# Patient Record
Sex: Male | Born: 1950
Health system: Southern US, Community
[De-identification: ages and names within clinical notes are randomized; demographics above are authoritative.]

## PROBLEM LIST (undated history)

## (undated) DIAGNOSIS — Z98811 Dental restoration status: Secondary | ICD-10-CM

## (undated) DIAGNOSIS — Z8489 Family history of other specified conditions: Secondary | ICD-10-CM

## (undated) DIAGNOSIS — E785 Hyperlipidemia, unspecified: Secondary | ICD-10-CM

## (undated) DIAGNOSIS — F419 Anxiety disorder, unspecified: Secondary | ICD-10-CM

## (undated) DIAGNOSIS — I714 Abdominal aortic aneurysm, without rupture, unspecified: Secondary | ICD-10-CM

## (undated) DIAGNOSIS — Z973 Presence of spectacles and contact lenses: Secondary | ICD-10-CM

## (undated) DIAGNOSIS — I10 Essential (primary) hypertension: Secondary | ICD-10-CM

## (undated) DIAGNOSIS — I2510 Atherosclerotic heart disease of native coronary artery without angina pectoris: Secondary | ICD-10-CM

## (undated) DIAGNOSIS — I219 Acute myocardial infarction, unspecified: Secondary | ICD-10-CM

## (undated) DIAGNOSIS — I480 Paroxysmal atrial fibrillation: Secondary | ICD-10-CM

## (undated) DIAGNOSIS — E7800 Pure hypercholesterolemia, unspecified: Secondary | ICD-10-CM

## (undated) DIAGNOSIS — S60459A Superficial foreign body of unspecified finger, initial encounter: Secondary | ICD-10-CM

## (undated) DIAGNOSIS — K219 Gastro-esophageal reflux disease without esophagitis: Secondary | ICD-10-CM

## (undated) DIAGNOSIS — C801 Malignant (primary) neoplasm, unspecified: Secondary | ICD-10-CM

## (undated) DIAGNOSIS — Z972 Presence of dental prosthetic device (complete) (partial): Secondary | ICD-10-CM

## (undated) HISTORY — DX: Malignant (primary) neoplasm, unspecified: C80.1

## (undated) HISTORY — PX: COLONOSCOPY: SHX174

## (undated) HISTORY — DX: Abdominal aortic aneurysm, without rupture: I71.4

## (undated) HISTORY — DX: Anxiety disorder, unspecified: F41.9

## (undated) HISTORY — DX: Pure hypercholesterolemia, unspecified: E78.00

## (undated) HISTORY — DX: Hyperlipidemia, unspecified: E78.5

## (undated) HISTORY — DX: Abdominal aortic aneurysm, without rupture, unspecified: I71.40

## (undated) HISTORY — DX: Essential (primary) hypertension: I10

## (undated) HISTORY — DX: Acute myocardial infarction, unspecified: I21.9

---

## 1995-08-22 DIAGNOSIS — I219 Acute myocardial infarction, unspecified: Secondary | ICD-10-CM

## 1995-08-22 HISTORY — DX: Acute myocardial infarction, unspecified: I21.9

## 1997-12-28 ENCOUNTER — Ambulatory Visit (HOSPITAL_COMMUNITY): Admission: RE | Admit: 1997-12-28 | Discharge: 1997-12-29 | Payer: Self-pay | Admitting: Cardiology

## 2003-03-24 ENCOUNTER — Ambulatory Visit (HOSPITAL_COMMUNITY): Admission: RE | Admit: 2003-03-24 | Discharge: 2003-03-24 | Payer: Self-pay | Admitting: Internal Medicine

## 2003-03-24 ENCOUNTER — Encounter: Payer: Self-pay | Admitting: Internal Medicine

## 2005-05-29 ENCOUNTER — Ambulatory Visit (HOSPITAL_COMMUNITY): Admission: RE | Admit: 2005-05-29 | Discharge: 2005-05-29 | Payer: Self-pay | Admitting: Internal Medicine

## 2005-05-29 ENCOUNTER — Ambulatory Visit: Payer: Self-pay | Admitting: Internal Medicine

## 2005-10-03 ENCOUNTER — Inpatient Hospital Stay (HOSPITAL_BASED_OUTPATIENT_CLINIC_OR_DEPARTMENT_OTHER): Admission: RE | Admit: 2005-10-03 | Discharge: 2005-10-03 | Payer: Self-pay | Admitting: Cardiology

## 2005-10-03 HISTORY — PX: CARDIAC CATHETERIZATION: SHX172

## 2005-10-16 ENCOUNTER — Inpatient Hospital Stay (HOSPITAL_COMMUNITY): Admission: RE | Admit: 2005-10-16 | Discharge: 2005-10-20 | Payer: Self-pay | Admitting: Cardiothoracic Surgery

## 2005-10-16 HISTORY — PX: CORONARY ARTERY BYPASS GRAFT: SHX141

## 2010-06-15 ENCOUNTER — Ambulatory Visit: Payer: Self-pay | Admitting: Internal Medicine

## 2010-06-17 DIAGNOSIS — R197 Diarrhea, unspecified: Secondary | ICD-10-CM

## 2010-06-17 HISTORY — DX: Diarrhea, unspecified: R19.7

## 2010-06-21 ENCOUNTER — Ambulatory Visit: Payer: Self-pay | Admitting: Cardiology

## 2010-09-22 NOTE — Assessment & Plan Note (Signed)
Summary: DIARRHEA-STILL GOING 6-7 X'SLAW   Visit Type:  Initial Consult Referring Provider:  Ouida Sills Primary Care Provider:  Ouida Sills  Chief Complaint:  diarrhea.  History of Present Illness: Pleasant 60 year old gentleman sent over out of the ccourtesy of Dr. Ouida Sills to further evaluate nearly 3 month history of diarrhea. Symptoms started insidiously going from from 4 bowel movements a day upwards of 10-12 nonbloody painless stools daily. No nocturnal diarrhea. Imodium did not help initially. He found that Kaopectate helped  better than anything else. Stool studies negative except for a positive lactoferrin. He states over the past couple weeks diarrhea has improved steadily now having approximately 4-6 bowel movements daily;  no abdominal pain. He continues to go about his regular routine, continues to work. No history of sick contacts, antibiotic therapy or unusual travel. No family history inflammatory bowel disease or colorectal cancer. He had his first ever screening colonoscopy 5 years ago ; negative except for some left-sided diverticula. He was slated to have another colonoscopy in 2016. He states he lost about 15 pounds during this time but hasw gainined the weight back. He has reflux symptoms well-controlled on Protonix 40 mg daily. In fact, he denies any upper GI tract symptoms such as reflux , dysphagia, early satiety, nausea or vomiting.  He states he always has had a nervous stomach. Gets diarrhea not infrequently with travel and other stressful scenarios.    Current Medications (verified): 1)  Toprol Xl 100 Mg Xr24h-Tab (Metoprolol Succinate) .... Take 1 Tablet By Mouth Once A Day 2)  Valturnal 150/160 .... Take 1 Tablet By Mouth Once A Day 3)  Lipitor 40 Mg Tabs (Atorvastatin Calcium) .... Take 1 Tablet By Mouth Once A Day 4)  Zetia 10 Mg Tabs (Ezetimibe) .... Take 1 Tablet By Mouth Once A Day 5)  Protonix 40 Mg Tbec (Pantoprazole Sodium) .... Take 1 Tablet By Mouth Once A Day 6)   Alprazolam 2 Mg Tabs (Alprazolam) .... One Tablet Daily 7)  Fish Oil .... Once Daily 8)  Glucosamine .... Take 1 Tablet By Mouth Once A Day 9)  Aspirin 325 Mg Tabs (Aspirin) .... Take 1 Tablet By Mouth Once A Day  Allergies (verified): 1)  ! Codeine 2)  ! * Niaspan  Past History:  Family History: Last updated: 06/15/2010 Father: Living age 22  Healthy Mother: Living age 17   Healthy Siblings: One sister    Open heart surgery  Social History: Last updated: 06/15/2010 Marital Status: Married Children: One son Occupation: Musician  Past Medical History: Hypertension  Hyperlipidemia Anxiety Reflux Heart Attack age 5  Past Surgical History: Open heart Surgery   2007  Family History: Father: Living age 95  Healthy Mother: Living age 29   Healthy Siblings: One sister    Open heart surgery  Social History: Marital Status: Married Children: One son Occupation: Musician  Vital Signs:  Patient profile:   60 year old male Height:      70 inches Weight:      187 pounds BMI:     26.93 Temp:     98.0 degrees F oral Pulse rate:   60 / minute BP sitting:   142 / 82  (left arm) Cuff size:   large  Vitals Entered By: Cloria Spring LPN (June 15, 2010 11:09 AM)  Physical Exam  General:  alert conversant well-appearing well-groomed individual resting comfortably Lungs:  clear to auscultation Heart:  regular rate rhythm without murmur gallop or Abdomen:  flat positive bowel  sounds soft, nontender without appreciable mass or organomegaly Rectal:  no mass the rectal vault. No stool in rectal vault. Mucous is Hemoccult-negative  Impression & Recommendations: Impression: 60 year old gentleman with a protracted bouts of nonbloody watery diarrhea over the past 3 months. Over the past few weeks, diarrhea has improved considerably. It appears he has had a protracted, acute illness superimposed on intermittent diarrhea predominant irritable bowel  syndrome.  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 Stool studies as noted above.  He certainly is clinically much improved.  Its good to know  he had a negative colonoscopy some 5 years ago.  Recommendations: Brief course of Bentyl 10 mg a.c. and h.s. with emphasis on taking the 10 mg tablets each morning for the next several days.  We'll also add a probiotic in the way of Restora to his regimen - one capsule daily  - samples provided.  He can begin to wean off Kaopectate.  Would like to check one immunofecal occult blood test on his stool in 3-4 weeks. If his clinical course is not one of steady improvemen,t he is to call in 2 weeks and let us know. At this point, I would not change the timing of his next colonoscopy which is currently slated for 2016.  I thank Dr. Ouida Sills for allowing me to see this very nice gentleman today.  Other Orders: Consultation Level III [phone])

## 2011-01-06 NOTE — Cardiovascular Report (Signed)
NAMECECILE, GUEVARA                  ACCOUNT NO.:  [id number]   MEDICAL RECORD NO.:  [id number]          PATIENT TYPE:  OIB   LOCATION:  1961                         FACILITY:  [facility]   PHYSICIAN:  Colleen Can. Deborah Chalk, M.D.DATE OF BIRTH:  [birth year]   DATE OF PROCEDURE:  10/03/2005  DATE OF DISCHARGE:                              CARDIAC CATHETERIZATION   HISTORY:  Mr. Couey is a 60 year old male with a history of recurrent angina  over the last year that has been worse for the last 4-5 months. He has known  ischemic heart disease with remote myocardial infarction. He has had a  totally occluded right coronary artery. He has ongoing cigarette abuse,  hyperlipidemia, and hypertension.   PROCEDURE:  Left heart catheterization with selective coronary angiography,  left ventricular angiography.   TYPE AND SITE OF ENTRY:  Percutaneous, right femoral artery.   CATHETERS:  A 4-French __________ Judkins right and left coronary catheters,  4-French pigtail ventriculographic catheter.   CONTRAST MATERIAL:  Omnipaque.   MEDICATIONS GIVEN PRIOR TO THE PROCEDURE:  Valium 10 mg p.o.   MEDICATIONS GIVEN DURING THE PROCEDURE:  Versed 5 milligrams IV.   COMMENTS:  The patient tolerated procedure well.   HEMODYNAMIC DATA:  Aortic pressure of 160/92, LV was 150/13-23. There is no  aortic valve gradient noted on pullback.   ANGIOGRAPHIC DATA:  1.  Left main coronary artery is normal.  2.  Left anterior descending is the largest vessel. There is a 70%+ ostial      stenosis of the left anterior descending. This vessel has a large septal      perforating branch that provides most of the flow to the septum as well      as wrapping around the apex.  3.  Intermediate. There is small to moderate-size intermediate that has      irregularities, otherwise normal.  4.  Left circumflex. Left circumflex is small to moderate in size. It does      give collaterals to the distal right coronary artery. It has  minor      irregularities but otherwise was without significant focal disease.  5.  Right coronary artery. The right coronary artery is totally occluded. It      has a high right ventricular branch that supplies the right ventricle.      After that, there is segmental 90-95% stenosis and then a short segment      of relatively normal vessel before a total occlusion. The chronic total      occlusion is relatively long in length. There are tortuous collaterals      to the distal vessel by way of a small RV branch. There is excellent      left-to-right collaterals to the distal right coronary artery.   Left ventricular angiogram was performed in the RAO position. Overall  cardiac size and silhouette are normal. Global left ventricular function  shows an ejection fraction 55-60%. Inferior wall motion is normal. There is  no mitral regurgitation, intracardiac calcification, or intracavitary  filling defect.   OVERALL IMPRESSION:  1.  Normal left ventricular function.  2.  Severe two-vessel disease, with totally occluded right coronary artery      with left-to-right collaterals and with a 70+% stenosis in the ostium of      the left anterior descending.   DISCUSSION:  With the ongoing risk factors and an active lifestyle and  ongoing angina, it is felt that Mr. Teagle probably is best suited with  coronary artery bypass grafting.      Colleen Can. Deborah Chalk, M.D.  Electronically Signed     SNT/MEDQ  D:  10/03/2005  T:  10/03/2005  Job:  [id number]   cc:   Kingsley Callander. Ouida Sills, MD  Fax: [phone]

## 2011-01-06 NOTE — Op Note (Signed)
NAMEHARROL, Tom [name]                  ACCOUNT NO.:  [id number]   MEDICAL RECORD NO.:  [id number]          PATIENT TYPE:  AMB   LOCATION:  DAY                           FACILITY:  [facility]   PHYSICIAN:  Lionel December, M.D.    DATE OF BIRTH:  May 17, 1951   DATE OF PROCEDURE:  05/29/2005  DATE OF DISCHARGE:                                 OPERATIVE REPORT   PROCEDURE:  Colonoscopy.   INDICATION:  Tom [name] is a 60 year old Caucasian male who is here for screening  colonoscopy.  Family history is negative for colon carcinoma in first degree  relative, but he had a maternal aunt who developed colon carcinoma in the  11s.   Procedure risks were reviewed with the patient, informed consent was  obtained.   PREOP MEDICATIONS:  Demerol 75 mg IV, Versed 12 mg IV.   FINDINGS:  Procedure performed in endoscopy suite.  Patient's vital signs  and O2 sat were monitored during procedure and remained stable.  Patient was  placed in left lateral position, rectal examination performed.  No  abnormality noted on external or digital exam.  Olympus videoscope was  placed in rectum and advanced under vision into sigmoid colon and beyond.  Preparation was satisfactory.  He had thick liquid stool in the proximal  colon which had to be washed and suctioned out.  Scope was passed to the  cecum which was identified by ileocecal valve and appendiceal orifice.  Short segment of TI was also examined and was normal.  As the scope was  withdrawn colonic mucosa was once again carefully examined and there were no  polyps or tumor masses noted.  A few diverticula were noted at sigmoid  colon.  Rectal mucosa was normal.  The scope was retroflexed to examine  anorectal junction, which was unremarkable.  Endoscope was straightened and  withdrawn.  Patient tolerated the procedure well.   FINAL DIAGNOSIS:  A few small diverticula at sigmoid colon, otherwise normal  examination.   RECOMMENDATIONS:  1.  He will resume his ASA as  before.  2.  High fiber diet.  3.  Yearly hemoccults.   You may consider next screening exam in 10 years from now.      Lionel December, M.D.  Electronically Signed     NR/MEDQ  D:  05/29/2005  T:  05/29/2005  Job:  [id number]   cc:   Kingsley Callander. Ouida Sills, MD  Fax: [phone]

## 2011-01-06 NOTE — Discharge Summary (Signed)
[name], [name]                  ACCOUNT NO.:  [id number]   MEDICAL RECORD NO.:  [id number]          PATIENT TYPE:  INP   LOCATION:  2014                         FACILITY:  [facility]   PHYSICIAN:  Sheliah Plane, MD    DATE OF BIRTH:  1951-07-03   DATE OF ADMISSION:  10/16/2005  DATE OF DISCHARGE:                                 DISCHARGE SUMMARY   DISCHARGE DATE:  Tentatively October 20, 2005, to October 21, 2005.   ADMISSION DIAGNOSIS:  Coronary occlusive disease.   DISCHARGE DIAGNOSES:  1.  Coronary occlusive disease, status post coronary artery bypass grafting      x2.  2.  History of premature cardiac disease, a myocardial infarction at the age      of 12.  3.  Hypertension.  4.  Hyperlipidemia.  5.  Ongoing tobacco abuse over the past 40 years of one to two packs per      day.   SERVICE:  CVTS, Sheliah Plane, M.D.   CARDIOLOGIST:  Colleen Can. Deborah Chalk, M.D.   CONSULTATIONS:  None.   PROCEDURE:  On October 16, 2005, the patient underwent coronary artery  bypass grafting x2 with left internal mammary to the left anterior  descending coronary artery, reversed saphenous vein graft to the distal  right coronary artery, with right thigh Endovein harvesting, by Dr. Sheliah Plane.   HISTORY OF PRESENT ILLNESS:  The patient is a 60 year old male who has a  history of premature cardiac disease, having an MI at the age of 77.  At  that time the patient had total occlusion of his right coronary artery  treated medically until the last year.  The patient does note that over the  past several months, especially during hunting season when he was doing  heavy exertion, he began having chest discomfort and increasing shortness of  breath with less exertion.  The patient notes that these spells have become  worse over the past several months, but he denies any pain at rest.  The  patient notes there six to eight months' increasing fatigue doing the usual  activities at work.  The  patient does still continue to smoke on a daily  basis.  Because of the patient's symptoms, he sought medical attention from  Dr. Deborah Chalk.  The patient was started on Norvasc due to his increasing  hypertension.  The patient's previous cardiac catheterization was one 10  years ago and another approximately eight years ago.  On October 03, 2005,  Dr. Deborah Chalk performed a cardiac catheterization.  This showed the patient's  left main is normal, left anterior descending is a large vessel with 70-80%  ostial stenosis.  There is a small to moderate-sized intermedius with  luminal irregularities.  The circumflex proper is also a small vessel that  gets collateral filling in the distal right coronary artery but without  significant disease.  The right coronary artery is totally occluded.  There  is collateral flow to the distal right coronary artery.  Overall ejection  fraction is 50%.  No mitral regurgitation is noted.  The patient's  EKG was  normal, chest x-ray was within normal limits.  It was thus felt that the  patient should undergo bypass surgery to the LAD and the totally occluded  right coronary artery, and he has agreed to the above.   HOSPITAL COURSE:  The patient's hospital course remains uneventful.  The  patient was extubated on October 17, 2005.  His vital signs were stable and  he remained in normal sinus rhythm at a rate of the 80s.  The patient's labs  were within normal limits.  He did have an EKG, which showed an old inferior  MI.  The patient remained awake and alert.  He was transferred to 2000 on  October 17, 2005.  The patient was having some problems with pain control.  The patient states he takes 3 mg of Xanax a day and sometimes up to 6 mg.  The patient's Xanax was increased.  Toradol was added to the patient's  regimen of pain medications for some more relief.   The patient remained volume-overloaded.  His weight on October 18, 2005,  was 197 and preoperatively is  185.  The patient is being diuresed daily with  improvement.  The patient is having some hypertension issues  postoperatively.  His Toprol was increased to 50 mg p.o. daily and his daily  Avapro dose of 150 mg was added.  The patient's hypertension did improve  with this addition.  Tobacco cessation was consulted on October 18, 2005,  and discussed with the patient smoking cessation, and the patient says he  already quit.   The patient is being weaned on oxygen.  He takes about 2-6 L of oxygen  depending.  The patient's saturations drop when he walks.  On October 18, 2005, the patient ambulated on 6 L of oxygen with a steady gait, the patient  did not appear as short of breath this time, next walk the patient's oxygen  will be weaned a bit.  On October 19, 2005, the patient's oxygen saturation was  89% walking on room air.  He is at 92% on 4 L.  The patient will be  discharged  in one or two days depending on his progression with the oxygen  weaning.  Otherwise, the patient's gait is stable.   PHYSICAL EXAMINATION:  VITAL SIGNS:  Temperature 97.7, pulse 95, BP 148/84,  weight 197, preop 185.  Oxygen 92% on 4 L.  CARDIAC:  Regular rate and rhythm.  Sternal incision is clear, dry and  intact.  RESPIRATIONS:  Decreased breath sounds bilaterally.  No wheezing or crackles  noted.  ABDOMEN:  Benign.  EXTREMITIES:  Minimal edema.  Leg incisions appear dry and intact.   White blood cell count of 14.0, hemoglobin 11.5, hematocrit 33.5, and  platelets 157.  A BMP showed a sodium of 130, potassium of 4.1, chloride 95,  CO2 29, BUN 6, creatinine 0.9, and glucose of 136.   DISCHARGE CONDITION:  Stable.   DISPOSITION:  The patient will be discharged to home.   MEDICATIONS:  1.  Aspirin 325 mg p.o. daily.  2.  Toprol XL 50 mg p.o. daily.  3.  Lipitor 40 mg p.o. daily.  4.  Aciphex 20 mg p.o. daily.  5.  Zetia 10 mg p.o. daily. 6.  Lasix 20 mg p.o. daily x7 days.  7.  Avapro 150 mg p.o.  daily.  8.  Xanax 1 mg p.o. daily p.r.n.  9.  Potassium chloride 10 mEq daily x7 days.  10. Glucosamine.  11. Oxycodone one to two tablets every four hours p.r.n. pain.   INSTRUCTIONS:  The patient was instructed to do no driving or heavy lifting  greater than 10 pounds for three weeks.  The patient is to ambulate three to  four times daily and increase activity as tolerated.  The patient is to  continue his breathing exercises.  The patient was educated on following a  low-fat, low-salt diet.  The patient should participate in smoking  cessation.  The patient may shower and clean his wounds with mild soap and  water.  He is to call the office if any wound problems shall arise, such as  erythema, drainage or bleeding from the wound site or temperature greater  than 101.5.   FOLLOW-UP:  The patient has an appointment with Dr. Tyrone Sage on November 09, 2005, at 11 a.m.  The patient is to call Dr. Roger Shelter for an  appointment in two weeks, where he will have a chest x-ray taken.      Constance Holster, [place]      Sheliah Plane, MD  Electronically Signed    JMW/MEDQ  D:  10/19/2005  T:  10/19/2005  Job:  [id number]   cc:   Colleen Can. Deborah Chalk, M.D.  Fax: [phone]

## 2011-01-06 NOTE — Op Note (Signed)
Tom [name], Tom [name]                  ACCOUNT NO.:  [id number]   MEDICAL RECORD NO.:  [id number]          PATIENT TYPE:  INP   LOCATION:  2310                         FACILITY:  [facility]   PHYSICIAN:  Sheliah Plane, MD    DATE OF BIRTH:  [birth year]   DATE OF PROCEDURE:  DATE OF DISCHARGE:                                 OPERATIVE REPORT   PREOPERATIVE DIAGNOSIS:  Coronary occlusive disease.   POSTOPERATIVE DIAGNOSIS:  Coronary occlusive disease.   SURGICAL PROCEDURE:  Coronary artery bypass graft x2 with left internal  mammary to the left anterior descending coronary artery, reverse saphenous  vein graft to the distal right coronary artery with right thigh endovein  harvesting.   SURGEON:  Sheliah Plane, M.D.   FIRST ASSISTANT:  Constance Holster, PA   BRIEF HISTORY:  The patient is a 60 year old male who approximately 15 years  ago had a myocardial infarction.  He has done well until recently when he  began having increasing fatigue and shortness of breath with exertion.  Cardiac catheterization was performed which showed total occlusion of the  right coronary artery which is old but progression of an ostial LAD lesion  which was a focal stenosis but right at the take-off of the LAD.  Because of  the anatomic location of the stenosis, coronary artery bypass grafting was  recommended to the patient who agreed and signed informed consent.   DESCRIPTION OF PROCEDURE:  With Swan-Ganz and arterial line monitors in  place, the patient underwent general endotracheal anesthesia without  incident.  The skin of the chest and legs was prepped with Betadine and  draped in the usual sterile manner.  Using the [employer] endovein harvesting  system, the vein was harvested from the right thigh and was of good quality  and caliber.  A median sternotomy was performed.  The left internal mammary  artery was dissected down to its pedicle graft.  The distal artery was  divided, had good free flow.  The  the vessel was hydrostatically dilated  with heparinized saline.  The pericardium was opened.  The overall  ventricular function appeared preserved.  Consideration for off-pump bypass  was entertained; however, because of the intramyocardial positioning of the  LAD, the small  distal right coronary, it was decided that placing the  patient on bypass would offer better control over the distal anatomic  situation.  The patient was systemically heparinized.  The ascending aorta  on the right atrium cannulated and the aortic root vent cardioplegia needle  was introduced into the ascending aorta.  The patient was placed on  cardiopulmonary bypass, 2.4 L/minute/meter squared.  The sites of the  anastomosis were selected and dissected out of the epicardium.   The more distal PD branches were extremely small.  The right coronary artery  was identified and dissected out of the epicardial fat to the take-off of  the posterior descending.  This vessel was diffusely diseased, approximately  1.2 to 1.3 mm in size.  The LAD was intramyocardial except the very distal  third of the vessel and was  approximately 1.5 mm in size.  The patient's  body temperature was allowed to drift down.  The aortic cross clamp was  applied and 500 mL of cold blood cardioplegia was administered antegrade  with rapid diastolic arrest and heart myocardial septal temperatures  monitored after cross clamp.   Attention was turned first to the distal right coronary artery and the take-  off of the PD.  The vessel was opened and admitted a 1 mm probe.  Using  running 7-0 Prolene, distal anastomosis was performed.  After completion of  the anastomosis, additional cold blood cardioplegia was administered down  the vein graft.   Attention was then turned to the left anterior descending coronary artery  which was opened and admitted a 1.5 mm probe proximal and distal.  Using a  running 8-0 Prolene, left internal mammary artery was   anastomosed  to the  left anterior descending coronary artery.  The mammary artery was tacked to  the epicardium.  With the cross clamp still in place, the aortic vent  cardioplegia needle was removed and a single punch aortotomy was performed.  The vein graft to the posterior descending coronary artery was anastomosed  to the ascending aorta.  Air was evacuated from the aorta.  The bulldog on  the mammary artery was removed with rapid rise in myocardial septal  temperature.  Aortic cross clamp was removed.  Total cross clamp time of 54  minutes.  The patient spontaneously converted to rhythm, initially heart  block that required transient AV pacing but ultimately returned to a normal  sinus rhythm.  The sites of the anastomosis were free of bleeding.  The  patient was then ventilated and weaned from cardiopulmonary bypass without  difficulty, remained hemodynamically stable.  He was decannulated in the  usual fashion.  Protamine sulfate was administered with operative field  hemostatic.  Two atrial, two ventricular pacing wires were applied.  Graft  marker was applied, left pleural tube and Blake mediastinal drain were left  in place.  Pericardium was loosely reapproximated.  The sternum was closed  with #6 stainless steel wire.  The fascia closed with interrupted 0 Vicryl,  running 3-0 Vicryl in subcutaneous tissue, 4-0 subcuticular stitches in skin  edges, dry dressings applied.  Sponge and needle count reported as correct  at the completion of the procedure.  The patient tolerated the procedure  without obvious complication, was transferred to surgical intensive care  unit for further postoperative care.  The patient did not require any blood  bank blood products during the operative procedure.  Sponge and needle count  were reported as correct.      Sheliah Plane, MD  Electronically Signed     EG/MEDQ  D:  10/16/2005  T:  10/16/2005  Job:  [id number]  cc:   Colleen Can. Deborah Chalk,  M.D.  Fax: [phone]   Kingsley Callander. Ouida Sills, MD  Fax: [phone]

## 2011-01-06 NOTE — H&P (Signed)
Tom [name], Tom [name]                  ACCOUNT NO.:  [id number]   MEDICAL RECORD NO.:  [id number]           PATIENT TYPE:   LOCATION:                                 FACILITY:   PHYSICIAN:  Colleen Can. Deborah Chalk, M.D.DATE OF BIRTH:  06/24/1951   DATE OF ADMISSION:  10/03/2005  DATE OF DISCHARGE:                                HISTORY & PHYSICAL   CHIEF COMPLAINT:  Exertional angina.   HISTORY OF PRESENT ILLNESS:  The patient is a 60 year old white male.  He  has a known history of ischemic heart disease with a remote history of  myocardial infarction dating back approximately 10 years ago.  The exact  details are unknown.  Apparently he does have a totally-occluded vessel and  disease in the right coronary.  He presents for an elective cardiac  catheterization.  It has been noted over the past several months that he has  had worsening chest pain that has been exertional in nature.  He has had no  episodes with rest.  His spells have been worse over the course of the  previous deer season.  He has had more in the way of shortness of breath and  no energy and no stamina.  He has not used nitroglycerin over the past two  years.  Unfortunately he does have ongoing tobacco abuse.   PAST MEDICAL HISTORY:  1.  Known ischemic heart disease with a remote history of a myocardial      infarction and a previous cardiac catheterization approximately 10 years      ago.  2.  Hypertension.  3.  Hyperlipidemia, currently on Lipitor and Zetia combination.  4.  Ongoing tobacco abuse over the past 40 years of one pack per day.   ALLERGIES:  CODEINE.   CURRENT MEDICATIONS:  1.  Toprol XL 100 mg daily.  2.  Avapro 150 mg daily.  3.  Zetia 10 mg daily.  4.  Lipitor 40 mg daily.  5.  Xanax p.r.n.  6.  Aspirin daily.  7.  Aciphex daily.  8.  Glucosamine daily.  9.  Cod liver oil daily.  10. Nitroglycerin p.r.n.   FAMILY HISTORY:  Both of his parents are alive.  His father is age 9, and  has heart  disease and hypertension and hyperlipidemia, as well as diabetes.  Mother is alive at the age of 62, and has heart disease and hypertension and  hyperlipidemia as well.   SOCIAL HISTORY:  He is self-employed as a Education administrator.  He has had a one-pack-  per-day smoking history over the past 40 years.  He is married and has one  son age 81.   REVIEW OF SYSTEMS:  He has had a recent chest cold.  He has had some pain in  the left thumb.  He has had no syncope.  No signs of a stroke.  No abdominal  pain, constipation or diarrhea.  No complaints of lower extremity edema.  The chest pain is as described above.   PHYSICAL EXAMINATION:  GENERAL:  He is a pleasant middle-aged white male,  in  no acute distress.  VITAL SIGNS:  Weight 187-1/2 pounds, blood pressure 150/100 sitting, 160/100  standing, heart rate 76.  HEENT:  Unremarkable.  NECK:  Supple without bruits.  LUNGS:  Clear.  HEART:  A regular rhythm without murmur.  ABDOMEN:  Soft, nontender.  EXTREMITIES:  Without edema.  Distal pulses intact.   Electrocardiogram is normal.   A chest x-ray is normal.   LABORATORY DATA:  Other labs are pending.   OVERALL IMPRESSION:  1.  Recurrent chest pain with exertion.  2.  Known ischemic heart disease with a remote history of myocardial      infarction.  3.  Hypertension.  4.  Hyperlipidemia.  5.  Ongoing tobacco abuse.   PLAN:  Will proceed on with outpatient cardiac catheterization.  The  procedure has been reviewed in full detail, and he is willing to proceed on  Tuesday, October 03, 2005.      Sharlee Blew, N.P.      Colleen Can. Deborah Chalk, M.D.  Electronically Signed    LC/MEDQ  D:  09/29/2005  T:  09/29/2005  Job:  [id number]   cc:   Kingsley Callander. Ouida Sills, MD  Fax: [phone]

## 2011-01-06 NOTE — Consult Note (Signed)
Tom [name], Tom [name]                  ACCOUNT NO.:  [id number]   MEDICAL RECORD NO.:  [id number]           PATIENT TYPE:   LOCATION:                                 FACILITY:   PHYSICIAN:  Sheliah Plane, MD         DATE OF BIRTH:   DATE OF CONSULTATION:  10/11/2005  DATE OF DISCHARGE:                                   CONSULTATION   REQUESTING PHYSICIAN:  Dr. Deborah Chalk   CARDIOLOGIST:  Dr. Deborah Chalk   REASON FOR CONSULTATION:  Coronary occlusive disease.   HISTORY OF PRESENT ILLNESS:  The patient is a 60 year old male who has a  history of premature cardiac disease, having had a myocardial infarction at  the age of 60.  At that time he had total occlusion of his right coronary  artery, treated medically until the last year.  The patient notes that over  the past several months, specifically during hunting season when doing heavy  exertion he began having chest discomfort and increasing shortness of breath  with less exertion.  He notes that these spells had become worse over the  past several months, but he denies any pain at rest.  He also notes over the  past 6-8 months increasing fatigue doing the usual activities at work.  He  continues to smoke on a daily basis.  Because of the symptoms, he sought  medical attention from Dr. Deborah Chalk.  In his office he was noted to have  increasing hypertension and was started on Norvasc.  Previous cardiac  catheterization, one 10 years ago and another approximately eight years ago.  Cardiac risk factors include hypertension, hyperlipidemia.  Denies diabetes.  Has been smoking 1-2 packs a day for almost 40 years.  Has a positive family  history of cardiac disease.  His father is age 33, had a myocardial  infarction at age 73, subsequently had bypass surgery.  His mother is alive  at age 65, has unspecified heart disease.  He has one sister who is alive  and well.   PAST MEDICAL HISTORY:  The patient has no other significant health problems  other than noted above.   PAST SURGICAL HISTORY:  No previous surgery other than cardiac  catheterization.   SOCIAL HISTORY:  The patient is married, employed as a Education administrator.   MEDICATIONS:  1.  Toprol-XL 100 mg a day.  2.  Avapro 150 mg a day.  3.  Zetia 10 mg a day.  4.  Lipitor 40 mg a day.  5.  Xanax p.r.n. (the patient notes that he takes up to 6 mg daily on a      regular basis).  6.  Aspirin 325 mg a day.  7.  Aciphex daily.  8.  Glucosamine, cod liver oil and nitroglycerin p.r.n., though he rarely      takes nitroglycerin.   ALLERGIES:  None noted.   REVIEW OF SYSTEMS:  Positive for chest pain and exertional shortness of  breath.  He denies lower extremity edema, palpitations, syncope, pre-  syncope, orthopnea, resting shortness of breath.  General review of systems:  CONSTITUTIONAL:  Denies fever, chills, night sweats, nausea, anorexia or  weight loss or gain.  Does have fatigue.  VISION:  Wears glasses.  Denies  any amaurosis.  ENT:  Denies any hearing loss, denies syncope.  RESPIRATORY:  The patient does note some nocturnal wheezing and has had shortness of  breath associated with exertion.  Denies hemoptysis.  GI:  Denies any change  in bowel habit.  Had a colonoscopy two months ago.  GU:  Denies hematuria or  trouble with urination.  DERMATOLOGY:  Denies any skin changes.  MUSCULOSKELETAL:  Denies any joint abnormalities other than left thumb pain  which has now resolved while taking glucosamine.  The patient is left-handed  and thinks this is related to painting.  HEMATOLOGY:  Denies easy bruising.  Has had no episodes of abnormal bleeding.  NEUROLOGIC:  Denies amaurosis  fugax or TIAs.  PSYCHIATRIC:  The patient notes that he is anxious, and his  wife confirms this, and has been on chronic Xanax therapy.  Denies polyuria  or polydipsia.   PHYSICAL EXAMINATION:  VITAL SIGNS:  Blood pressure 158/85, pulse 70,  respiratory rate 18, height 5 feet 10 inches, weight 185  pounds.  GENERAL:  The patient is a healthy-appearing male in no distress.  HEENT:  Pupils equal, round and reactive to light.  NECK:  Without carotid bruits.  LUNGS:  Clear bilaterally without wheezing.  CARDIAC:  Regular rate and rhythm without murmur or gallop.  ABDOMEN:  Benign without palpable masses.  GENITOURINARY:  Normal male.  RECTAL:  Deferred.  NEUROLOGIC:  Grossly intact.  LYMPHATICS:  He has no cervical or supraclavicular adenopathy.  SKIN:  Without changes.  VASCULAR:  +1 DP and PT pulses bilaterally.   LABORATORY DATA:  Normal EKG, normal chest x-ray.  INR 0.86, PT 12.9, PTT  31.  Glucose 80, BUN 12, creatinine 1.3.  Hematocrit 46%.   Cardiac catheterization was done on October 03, 2005 by Dr. Deborah Chalk at [facility] and films are reviewed.  The patient's left main is normal, left  anterior descending is a large vessel with 70% to 80% ostial stenosis.  There is a small to moderate sized intermedius with luminal irregularities.  The circumflex proper is also a small vessel that gets collateral filling to  the distal right coronary artery but without significant disease.  The right  coronary artery is totally occluded.  There is collateral flow to the distal  right coronary artery.  Overall ejection fraction is 50%.  There is no MR.   After reviewing the films and the patient's history, I agree with Dr.  Deborah Chalk that bypass surgery to the LAD and the totally occluded right  coronary artery would improve the patient's symptoms, and because of the  large LAD probably will prolong his life, also.  The risks and options were  discussed with the patient in detail, including the  risks of surgery with death, infection, stroke, myocardial infarction,  bleeding and blood transfusion.  The patient is willing to proceed.  We will  make arrangements for surgery on Monday, February 26.  The patient is  agreeable with this.      Sheliah Plane, MD  Electronically  Signed     EG/MEDQ  D:  10/11/2005  T:  10/11/2005  Job:  [id number]   cc:   Kingsley Callander. Ouida Sills, MD  Fax: [phone]   Colleen Can. Deborah Chalk, M.D.  Fax: [phone]

## 2011-01-11 ENCOUNTER — Other Ambulatory Visit: Payer: Self-pay | Admitting: *Deleted

## 2011-01-11 ENCOUNTER — Ambulatory Visit: Payer: Self-pay | Admitting: Cardiology

## 2011-01-26 ENCOUNTER — Other Ambulatory Visit: Payer: Self-pay | Admitting: *Deleted

## 2011-01-26 ENCOUNTER — Encounter: Payer: Self-pay | Admitting: Cardiology

## 2011-01-26 DIAGNOSIS — E785 Hyperlipidemia, unspecified: Secondary | ICD-10-CM

## 2011-01-27 ENCOUNTER — Ambulatory Visit (INDEPENDENT_AMBULATORY_CARE_PROVIDER_SITE_OTHER): Payer: BC Managed Care – PPO | Admitting: Cardiology

## 2011-01-27 ENCOUNTER — Other Ambulatory Visit (INDEPENDENT_AMBULATORY_CARE_PROVIDER_SITE_OTHER): Payer: BC Managed Care – PPO | Admitting: *Deleted

## 2011-01-27 ENCOUNTER — Encounter: Payer: Self-pay | Admitting: Cardiology

## 2011-01-27 DIAGNOSIS — E7800 Pure hypercholesterolemia, unspecified: Secondary | ICD-10-CM

## 2011-01-27 DIAGNOSIS — F17200 Nicotine dependence, unspecified, uncomplicated: Secondary | ICD-10-CM

## 2011-01-27 DIAGNOSIS — I2510 Atherosclerotic heart disease of native coronary artery without angina pectoris: Secondary | ICD-10-CM

## 2011-01-27 DIAGNOSIS — Z720 Tobacco use: Secondary | ICD-10-CM | POA: Insufficient documentation

## 2011-01-27 DIAGNOSIS — E785 Hyperlipidemia, unspecified: Secondary | ICD-10-CM

## 2011-01-27 HISTORY — DX: Tobacco use: Z72.0

## 2011-01-27 HISTORY — DX: Atherosclerotic heart disease of native coronary artery without angina pectoris: I25.10

## 2011-01-27 LAB — HEPATIC FUNCTION PANEL
ALT: 32 U/L (ref 0–53)
Albumin: 4.3 g/dL (ref 3.5–5.2)
Alkaline Phosphatase: 69 U/L (ref 39–117)
Bilirubin, Direct: 0.1 mg/dL (ref 0.0–0.3)
Total Protein: 7.4 g/dL (ref 6.0–8.3)

## 2011-01-27 LAB — BASIC METABOLIC PANEL
BUN: 21 mg/dL (ref 6–23)
Chloride: 105 mEq/L (ref 96–112)
Creatinine, Ser: 1.5 mg/dL (ref 0.4–1.5)
GFR: 50.54 mL/min — ABNORMAL LOW (ref >60.00)
Potassium: 4.5 mEq/L (ref 3.5–5.1)

## 2011-01-27 LAB — LIPID PANEL
HDL: 30.9 mg/dL — ABNORMAL LOW (ref >39.00)
Total CHOL/HDL Ratio: 4
Triglycerides: 84 mg/dL (ref 0.0–149.0)

## 2011-01-27 NOTE — Assessment & Plan Note (Signed)
Overall, he continues to do well. I'll have him see Dr. Earleen Newport her in 6 months and consider stress Cardiolite study at that time. We'll continue current medications.

## 2011-01-27 NOTE — Progress Notes (Signed)
Subjective:   Tom [name] comes back in the office today for followup visit. Overall, he's been doing well. 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 He is started back smoking cigarettes and I cautioned him about smoking and encouraged him to stop. He does have a history of chronic anxieties which makes stopping smoking even harder. He a coronary artery bypass grafting x2 in February 2007 with a LIMA to the LAD and a saphenous vein graft the right coronary artery. He does have a history of hypertension and hyperlipemia. His last stress test was in June of 2009. I will defer followup stress testing until he sees Dr. Elease Hashimoto in 6 months. Blood pressure readings by me or 135/80.  Current Outpatient Prescriptions  Medication Sig Dispense Refill  . Aliskiren-Valsartan (VALTURNA) 300-320 MG TABS Take by mouth daily.        [name] Kitchen ALPRAZolam (XANAX) 0.25 MG tablet Take 0.25 mg by mouth daily.        [name] Kitchen atorvastatin (LIPITOR) 40 MG tablet Take 40 mg by mouth daily.        [name] Kitchen dicyclomine (BENTYL) 10 MG capsule Take 10 mg by mouth as needed.       . ezetimibe (ZETIA) 10 MG tablet Take 10 mg by mouth daily.        . fish oil-omega-3 fatty acids 1000 MG capsule Take 2 g by mouth daily.        . metoprolol (TOPROL-XL) 100 MG 24 hr tablet Take 100 mg by mouth daily.        . Misc Natural Products (OSTEO BI-FLEX ADV TRIPLE ST) TABS Take 2 tablets by mouth daily.        . nitroGLYCERIN (NITROSTAT) 0.4 MG SL tablet Place 0.4 mg under the tongue every 5 (five) minutes as needed.        . pantoprazole (PROTONIX) 40 MG tablet Take 40 mg by mouth daily.          Allergies  Allergen Reactions  . Codeine   . Niacin     Patient Active Problem List  Diagnoses  . DIARRHEA    History  Smoking status  . Current Some Day Smoker -- 1.0 packs/day for 44 years  . Types: Cigarettes  Smokeless tobacco  . Never Used  Comment: less than 1 ppd    History  Alcohol Use No    Family  History  Problem Relation Age of Onset  . Heart disease Father   . Heart disease Mother   . Hypertension Father   . Hyperlipidemia Father   . Diabetes Father   . Hypertension Mother   . Hyperlipidemia Mother     Review of Systems:   The patient denies any heat or cold intolerance.  No weight gain or weight loss.  The patient denies headaches or blurry vision.  There is no cough or sputum production.  The patient denies dizziness.  There is no hematuria or hematochezia.  The patient denies any muscle aches or arthritis.  The patient denies any rash.  The patient denies frequent falling or instability.  There is no history of depression or anxiety.  All other systems were reviewed and are negative.   Physical Exam:   Blood pressure is 135/80. Heart rate is 54. Weights 192.The head is normocephalic and atraumatic.  Pupils are equally round and reactive to light.  Sclerae nonicteric.  Conjunctiva is clear.  Oropharynx is unremarkable.  There's adequate oral airway.  Neck  is supple there are no masses.  Thyroid is not enlarged.  There is no lymphadenopathy.  Lungs are clear.  Chest is symmetric.  Heart shows a regular rate and rhythm.  S1 and S2 are normal.  There is no murmur click or gallop.  Abdomen is soft normal bowel sounds.  There is no organomegaly.  Genital and rectal deferred.  Extremities are without edema.  Peripheral pulses are adequate.  Neurologically intact.  Full range of motion.  The patient is not depressed.  Skin is warm and dry. Assessment / Plan:

## 2011-01-30 NOTE — Progress Notes (Signed)
Pt called with resultsPt verbalized understanding. Jodette Samiya Mervin RN  

## 2011-02-01 ENCOUNTER — Encounter: Payer: Self-pay | Admitting: Cardiology

## 2011-03-28 ENCOUNTER — Other Ambulatory Visit: Payer: Self-pay | Admitting: *Deleted

## 2011-03-28 DIAGNOSIS — E7800 Pure hypercholesterolemia, unspecified: Secondary | ICD-10-CM

## 2011-04-17 ENCOUNTER — Other Ambulatory Visit: Payer: Self-pay | Admitting: *Deleted

## 2011-04-17 MED ORDER — VALSARTAN 320 MG PO TABS: 320.0000 mg | ORAL_TABLET | Freq: Every day | ORAL | Status: DC

## 2011-04-17 MED ORDER — AMLODIPINE BESYLATE 5 MG PO TABS: 5.0000 mg | ORAL_TABLET | Freq: Every day | ORAL | Status: DC

## 2011-04-17 NOTE — Telephone Encounter (Signed)
Med vulturna off market, consulted Lori gerhardt NP and he was on Norvasc prior and caused a cough but will need to go back on along with diovan also msg left to make an app with [name] in one month for follow up. Alfonso Ramus RN

## 2011-04-18 ENCOUNTER — Telehealth: Payer: Self-pay | Admitting: Cardiovascular Disease

## 2011-04-18 NOTE — Telephone Encounter (Signed)
Pt's wife called today and states she received a call from nurse to move his appt up to a month from now instead of the December appt.  Pt's wife states her husband's medication was discontinued and received call to get her husband in early.  Please call pt back to move appt up.  Pt's wife states alternate #  [phone]

## 2011-04-19 ENCOUNTER — Telehealth: Payer: Self-pay | Admitting: *Deleted

## 2011-04-19 NOTE — Telephone Encounter (Signed)
Called and left msg to call back to discuss need to get in earlier than scheduled.

## 2011-04-27 ENCOUNTER — Other Ambulatory Visit: Payer: Self-pay | Admitting: *Deleted

## 2011-04-27 DIAGNOSIS — E785 Hyperlipidemia, unspecified: Secondary | ICD-10-CM

## 2011-05-19 ENCOUNTER — Encounter: Payer: Self-pay | Admitting: Cardiovascular Disease

## 2011-05-19 ENCOUNTER — Other Ambulatory Visit: Payer: BC Managed Care – PPO | Admitting: *Deleted

## 2011-05-19 ENCOUNTER — Ambulatory Visit (INDEPENDENT_AMBULATORY_CARE_PROVIDER_SITE_OTHER): Payer: BC Managed Care – PPO | Admitting: Cardiovascular Disease

## 2011-05-19 ENCOUNTER — Telehealth: Payer: Self-pay | Admitting: Cardiovascular Disease

## 2011-05-19 VITALS — BP 126/70 | HR 60 | Ht 70.0 in | Wt 186.0 lb

## 2011-05-19 DIAGNOSIS — F17200 Nicotine dependence, unspecified, uncomplicated: Secondary | ICD-10-CM

## 2011-05-19 DIAGNOSIS — Z720 Tobacco use: Secondary | ICD-10-CM

## 2011-05-19 DIAGNOSIS — I2510 Atherosclerotic heart disease of native coronary artery without angina pectoris: Secondary | ICD-10-CM

## 2011-05-19 LAB — LIPID PANEL
Cholesterol: 119 mg/dL (ref 0–200)
Total CHOL/HDL Ratio: 4
Triglycerides: 62 mg/dL (ref 0.0–149.0)

## 2011-05-19 LAB — HEPATIC FUNCTION PANEL
ALT: 25 U/L (ref 0–53)
AST: 23 U/L (ref 0–37)
Albumin: 4.2 g/dL (ref 3.5–5.2)
Alkaline Phosphatase: 76 U/L (ref 39–117)
Total Protein: 7.2 g/dL (ref 6.0–8.3)

## 2011-05-19 LAB — BASIC METABOLIC PANEL
Calcium: 9.7 mg/dL (ref 8.4–10.5)
Creatinine, Ser: 1.4 mg/dL (ref 0.4–1.5)
GFR: 56.97 mL/min — ABNORMAL LOW (ref >60.00)
Sodium: 142 mEq/L (ref 135–145)

## 2011-05-19 NOTE — Telephone Encounter (Signed)
Pt's wife called to verify appointment time and I told her it was at 0845 She said the recording said 0945 Please call wife back [phone]

## 2011-05-19 NOTE — Progress Notes (Signed)
Tom [name] Date of Birth  28-Oct-1950 [facility] [address]    [address] [place], [place]  [address] [phone]  Fax  [phone]  History of Present Illness:  60 year old gentleman with a history of coronary disease. He status post coronary artery bypass grafting in February 2007. He had a left internal mammary artery to the LAD and a saphenous vein graft to the right coronary artery. He also has a history of hypercholesterolemia, hypertension, and ongoing cigarette smoking.  He's not had any episodes of chest pain, shortness breath, syncope, or presyncope.  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 He does not do any regular aerobic exercise  Current Outpatient Prescriptions on File Prior to Visit  Medication Sig Dispense Refill  . ALPRAZolam (XANAX) 0.25 MG tablet Take 0.25 mg by mouth daily.        [name] Kitchen amLODipine (NORVASC) 5 MG tablet Take 1 tablet (5 mg total) by mouth daily.  30 tablet  1  . atorvastatin (LIPITOR) 40 MG tablet Take 40 mg by mouth daily.        [name] Kitchen ezetimibe (ZETIA) 10 MG tablet Take 10 mg by mouth daily.        . fish oil-omega-3 fatty acids 1000 MG capsule Take 2 g by mouth daily.        . metoprolol (TOPROL-XL) 100 MG 24 hr tablet Take 100 mg by mouth daily.        . Misc Natural Products (OSTEO BI-FLEX ADV TRIPLE ST) TABS Take 2 tablets by mouth daily.        . nitroGLYCERIN (NITROSTAT) 0.4 MG SL tablet Place 0.4 mg under the tongue every 5 (five) minutes as needed.        . pantoprazole (PROTONIX) 40 MG tablet Take 40 mg by mouth daily.        . valsartan (DIOVAN) 320 MG tablet Take 1 tablet (320 mg total) by mouth daily.  30 tablet  1    Allergies  Allergen Reactions  . Codeine   . Niacin     Past Medical History  Diagnosis Date  . Anxiety   . Renal insufficiency   . Hypertension   . Hypercholesteremia   . Myocardial infarction 1997    Past Surgical History  Procedure Date  . Coronary artery bypass graft  09/2005    x2 LIMA to LAD saphenous vein to RCA    History  Smoking status  . Current Some Day Smoker -- 1.0 packs/day for 44 years  . Types: Cigarettes  Smokeless tobacco  . Never Used  Comment: less than 1 ppd    History  Alcohol Use No    Family History  Problem Relation Age of Onset  . Heart disease Father   . Heart disease Mother   . Hypertension Father   . Hyperlipidemia Father   . Diabetes Father   . Hypertension Mother   . Hyperlipidemia Mother     Reviw of Systems:  Reviewed in the HPI.  All other systems are negative.  Physical Exam: BP 126/70  Pulse 60  Ht 5\' 10"  (1.778 m)  Wt 186 lb (84.369 kg)  BMI 26.69 kg/m2 The patient is alert and oriented x 3.  The mood and affect are normal.   Skin: warm and dry.  Color is normal.    HEENT:   the sclera are nonicteric.  The mucous membranes are moist.  The carotids are 2+ without bruits.  There is no thyromegaly.  There is no JVD.    Lungs: clear.  The chest wall is non tender.    Heart: regular rate with a normal S1 and S2.  There are no murmurs, gallops, or rubs. The PMI is not displaced.     Abdomen: good bowel sounds.  There is no guarding or rebound.  There is no hepatosplenomegaly or tenderness.  There are no masses.   Extremities:  no clubbing, cyanosis, or edema.  The legs are without rashes.  The distal pulses are intact.   Neuro:  Cranial nerves II - XII are intact.  Motor and sensory functions are intact.    The gait is normal.  ECG:  Assessment / Plan:

## 2011-05-19 NOTE — Patient Instructions (Signed)
Continue current meds.  Try to stop smoking.

## 2011-05-19 NOTE — Assessment & Plan Note (Signed)
He's not having any episodes of chest pain. 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 We will continue the same medications. We'll check a lipid profile, basic about profile, and hepatic profile today. We'll recheck them again when I see him again in 6 months.

## 2011-05-19 NOTE — Telephone Encounter (Signed)
Called told to come now.

## 2011-05-19 NOTE — Assessment & Plan Note (Signed)
I've encouraged him to stop smoking which would help reduce his risk of further cardiac problems.

## 2011-06-17 ENCOUNTER — Other Ambulatory Visit: Payer: Self-pay | Admitting: Cardiovascular Disease

## 2011-07-28 ENCOUNTER — Other Ambulatory Visit: Payer: BC Managed Care – PPO | Admitting: *Deleted

## 2011-07-28 ENCOUNTER — Ambulatory Visit: Payer: BC Managed Care – PPO | Admitting: Cardiovascular Disease

## 2011-07-28 ENCOUNTER — Encounter: Payer: BC Managed Care – PPO | Admitting: *Deleted

## 2011-08-22 DIAGNOSIS — C801 Malignant (primary) neoplasm, unspecified: Secondary | ICD-10-CM

## 2011-08-22 HISTORY — DX: Malignant (primary) neoplasm, unspecified: C80.1

## 2011-08-24 ENCOUNTER — Other Ambulatory Visit: Payer: Self-pay | Admitting: Cardiology

## 2011-11-15 ENCOUNTER — Other Ambulatory Visit (INDEPENDENT_AMBULATORY_CARE_PROVIDER_SITE_OTHER): Payer: BC Managed Care – PPO

## 2011-11-15 ENCOUNTER — Encounter: Payer: Self-pay | Admitting: Cardiovascular Disease

## 2011-11-15 ENCOUNTER — Ambulatory Visit (INDEPENDENT_AMBULATORY_CARE_PROVIDER_SITE_OTHER): Payer: BC Managed Care – PPO | Admitting: Cardiovascular Disease

## 2011-11-15 VITALS — BP 120/70 | HR 59 | Ht 70.0 in | Wt 199.4 lb

## 2011-11-15 DIAGNOSIS — Z720 Tobacco use: Secondary | ICD-10-CM

## 2011-11-15 DIAGNOSIS — F17200 Nicotine dependence, unspecified, uncomplicated: Secondary | ICD-10-CM

## 2011-11-15 DIAGNOSIS — I2510 Atherosclerotic heart disease of native coronary artery without angina pectoris: Secondary | ICD-10-CM

## 2011-11-15 LAB — HEPATIC FUNCTION PANEL
ALT: 47 U/L (ref 0–53)
AST: 34 U/L (ref 0–37)
Alkaline Phosphatase: 66 U/L (ref 39–117)
Bilirubin, Direct: 0.1 mg/dL (ref 0.0–0.3)
Total Bilirubin: 0.4 mg/dL (ref 0.3–1.2)

## 2011-11-15 LAB — BASIC METABOLIC PANEL
BUN: 16 mg/dL (ref 6–23)
Calcium: 9.1 mg/dL (ref 8.4–10.5)
GFR: 54.11 mL/min — ABNORMAL LOW (ref >60.00)
Glucose, Bld: 102 mg/dL — ABNORMAL HIGH (ref 70–99)
Sodium: 138 mEq/L (ref 135–145)

## 2011-11-15 NOTE — Assessment & Plan Note (Signed)
I've advised him to stop smoking which will serve to help him avoid any further cardiac problems.

## 2011-11-15 NOTE — Assessment & Plan Note (Signed)
He's not having any episodes of chest pain. 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 We will continue the same medications. We'll check a lipid profile, basic about profile, and hepatic profile today. We'll recheck them again when I see him again in 6 months. 

## 2011-11-15 NOTE — Progress Notes (Signed)
Tom [name] Date of Birth  14-Jan-1951 [facility] [address]    [address] [place], [place]  [address] [phone]  Fax  [phone]  Problem list: 1. Coronary artery disease-status post CABG. February 2007. He had aLIMA  to the LAD and a SVG to the right coronary artery. 2. Hypertension 3. Hyperlipidemia 4. Renal insufficiency  History of Present Illness:  61 y.o. year-old gentleman with a history of coronary disease. He status post coronary artery bypass grafting in February 2007. He had a left internal mammary artery to the LAD and a saphenous vein graft to the right coronary artery. He also has a history of hypercholesterolemia, hypertension, and ongoing cigarette smoking.  He's not had any episodes of chest pain, shortness breath, syncope, or presyncope.  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 He does not do any regular aerobic exercise.  He's not had any recurrent episodes of chest pain. He tries to watch his salt intake.  He still smokes about a half a pack of cigarettes a day but has cut back from 2 packs a day.  Current Outpatient Prescriptions on File Prior to Visit  Medication Sig Dispense Refill  . ALPRAZolam (XANAX) 0.25 MG tablet Take 0.25 mg by mouth daily.        [name] Kitchen amLODipine (NORVASC) 5 MG tablet TAKE 1 TABLET (5 MG TOTAL) BY MOUTH DAILY.  30 tablet  6  . atorvastatin (LIPITOR) 40 MG tablet Take 40 mg by mouth daily.        [name] Kitchen DIOVAN 320 MG tablet TAKE 1 TABLET BY MOUTH DAILY  30 tablet  6  . fish oil-omega-3 fatty acids 1000 MG capsule Take 2 g by mouth daily.        . metoprolol (TOPROL-XL) 100 MG 24 hr tablet Take 100 mg by mouth daily.        . Misc Natural Products (OSTEO BI-FLEX ADV TRIPLE ST) TABS Take 2 tablets by mouth daily.        . nitroGLYCERIN (NITROSTAT) 0.4 MG SL tablet Place 0.4 mg under the tongue every 5 (five) minutes as needed.        . pantoprazole (PROTONIX) 40 MG tablet Take 40 mg by mouth daily.          [name] Kitchen ZETIA 10 MG tablet TAKE 1 TABLET EVERY DAY BY MOUTH  30 tablet  6    Allergies  Allergen Reactions  . Codeine   . Niacin     Past Medical History  Diagnosis Date  . Anxiety   . Renal insufficiency   . Hypertension   . Hypercholesteremia   . Myocardial infarction 1997    Past Surgical History  Procedure Date  . Coronary artery bypass graft 09/2005    x2 LIMA to LAD saphenous vein to RCA    History  Smoking status  . Current Some Day Smoker -- 1.0 packs/day for 44 years  . Types: Cigarettes  Smokeless tobacco  . Never Used  Comment: less than 1 ppd    History  Alcohol Use No    Family History  Problem Relation Age of Onset  . Heart disease Father   . Heart disease Mother   . Hypertension Father   . Hyperlipidemia Father   . Diabetes Father   . Hypertension Mother   . Hyperlipidemia Mother     Reviw of Systems:  Reviewed in the HPI.  All other systems are negative.  Physical Exam:  BP 150/85  Pulse 59  Ht 5\' 10"  (1.778 m)  Wt 199 lb 6.4 oz (90.447 kg)  BMI 28.61 kg/m2 The patient is alert and oriented x 3.  The mood and affect are normal.   Skin: warm and dry.  Color is normal.    HEENT:   the sclera are nonicteric.  The mucous membranes are moist.  The carotids are 2+ without bruits.  There is no thyromegaly.  There is no JVD.    Lungs: clear.  The chest wall is non tender.    Heart: regular rate with a normal S1 and S2.  There are no murmurs, gallops, or rubs. The PMI is not displaced.     Abdomen: good bowel sounds.  There is no guarding or rebound.  There is no hepatosplenomegaly or tenderness.  There are no masses.   Extremities:  no clubbing, cyanosis, or edema.  The legs are without rashes.  The distal pulses are intact.   Neuro:  Cranial nerves II - XII are intact.  Motor and sensory functions are intact.    The gait is normal.  ECG:  Assessment / Plan:

## 2011-11-15 NOTE — Patient Instructions (Signed)
Your physician wants you to follow-up in: 6 MONTHS You will receive a reminder letter in the mail two months in advance. If you don't receive a letter, please call our office to schedule the follow-up appointment.  Your physician recommends that you return for a FASTING lipid profile: 6 MONTHS    

## 2012-01-17 ENCOUNTER — Other Ambulatory Visit: Payer: Self-pay

## 2012-01-17 ENCOUNTER — Other Ambulatory Visit: Payer: Self-pay | Admitting: Cardiovascular Disease

## 2012-01-17 MED ORDER — VALSARTAN 320 MG PO TABS: 320.0000 mg | ORAL_TABLET | Freq: Every day | ORAL | Status: DC

## 2012-01-17 MED ORDER — AMLODIPINE BESYLATE 5 MG PO TABS: 5.0000 mg | ORAL_TABLET | Freq: Every day | ORAL | Status: DC

## 2012-01-19 ENCOUNTER — Other Ambulatory Visit: Payer: Self-pay | Admitting: *Deleted

## 2012-01-19 ENCOUNTER — Other Ambulatory Visit: Payer: Self-pay | Admitting: Cardiovascular Disease

## 2012-01-19 MED ORDER — VALSARTAN 320 MG PO TABS: 320.0000 mg | ORAL_TABLET | Freq: Every day | ORAL | Status: DC

## 2012-01-19 NOTE — Telephone Encounter (Signed)
Fax Received. Refill Completed. Tom [name] (R.M.A)   

## 2012-01-19 NOTE — Telephone Encounter (Signed)
Opened in Error.

## 2012-01-19 NOTE — Telephone Encounter (Signed)
Fax Received. Refill Completed. Gita Dilger Chowoe (R.M.A)   

## 2012-03-08 ENCOUNTER — Encounter: Payer: Self-pay | Admitting: Cardiology

## 2012-03-30 ENCOUNTER — Other Ambulatory Visit: Payer: Self-pay | Admitting: Cardiovascular Disease

## 2012-04-01 NOTE — Telephone Encounter (Signed)
Fax Received. Refill Completed. Tom [name] (R.M.A)   

## 2012-05-09 ENCOUNTER — Other Ambulatory Visit: Payer: Self-pay | Admitting: *Deleted

## 2012-05-09 DIAGNOSIS — E7800 Pure hypercholesterolemia, unspecified: Secondary | ICD-10-CM

## 2012-05-09 DIAGNOSIS — I2510 Atherosclerotic heart disease of native coronary artery without angina pectoris: Secondary | ICD-10-CM

## 2012-05-10 ENCOUNTER — Other Ambulatory Visit (INDEPENDENT_AMBULATORY_CARE_PROVIDER_SITE_OTHER): Payer: BC Managed Care – PPO

## 2012-05-10 DIAGNOSIS — E7800 Pure hypercholesterolemia, unspecified: Secondary | ICD-10-CM

## 2012-05-10 DIAGNOSIS — I2510 Atherosclerotic heart disease of native coronary artery without angina pectoris: Secondary | ICD-10-CM

## 2012-05-10 LAB — BASIC METABOLIC PANEL
CO2: 25 mEq/L (ref 19–32)
Chloride: 106 mEq/L (ref 96–112)
Creatinine, Ser: 1.4 mg/dL (ref 0.4–1.5)
Glucose, Bld: 101 mg/dL — ABNORMAL HIGH (ref 70–99)

## 2012-05-10 LAB — HEPATIC FUNCTION PANEL
Albumin: 4.1 g/dL (ref 3.5–5.2)
Alkaline Phosphatase: 71 U/L (ref 39–117)
Total Bilirubin: 0.6 mg/dL (ref 0.3–1.2)

## 2012-05-10 LAB — LIPID PANEL
LDL Cholesterol: 65 mg/dL (ref 0–99)
Total CHOL/HDL Ratio: 4
Triglycerides: 75 mg/dL (ref 0.0–149.0)

## 2012-05-23 ENCOUNTER — Encounter: Payer: Self-pay | Admitting: Cardiovascular Disease

## 2012-05-23 ENCOUNTER — Ambulatory Visit (INDEPENDENT_AMBULATORY_CARE_PROVIDER_SITE_OTHER): Payer: BC Managed Care – PPO | Admitting: Cardiovascular Disease

## 2012-05-23 VITALS — BP 136/84 | HR 56 | Ht 70.0 in | Wt 188.8 lb

## 2012-05-23 DIAGNOSIS — I2510 Atherosclerotic heart disease of native coronary artery without angina pectoris: Secondary | ICD-10-CM

## 2012-05-23 DIAGNOSIS — R0989 Other specified symptoms and signs involving the circulatory and respiratory systems: Secondary | ICD-10-CM

## 2012-05-23 DIAGNOSIS — R1900 Intra-abdominal and pelvic swelling, mass and lump, unspecified site: Secondary | ICD-10-CM

## 2012-05-23 DIAGNOSIS — I714 Abdominal aortic aneurysm, without rupture: Secondary | ICD-10-CM

## 2012-05-23 MED ORDER — NITROGLYCERIN 0.4 MG SL SUBL: 0.4000 mg | SUBLINGUAL_TABLET | SUBLINGUAL | Status: DC | PRN

## 2012-05-23 NOTE — Assessment & Plan Note (Signed)
Hodge is feeling well.  He needs to get out and exercise more.  I have encouraged him to get some aerobic exercise.  He has not had any symptoms of angina.  Continue current meds.

## 2012-05-23 NOTE — Patient Instructions (Addendum)
Your physician has requested that you have an abdominal aorta duplex. During this test, an ultrasound is used to evaluate the aorta. Allow 30 minutes for this exam. Do not eat after midnight the day before and avoid carbonated beverages  Your physician wants you to follow-up in: 6 months  You will receive a reminder letter in the mail two months in advance. If you don't receive a letter, please call our office to schedule the follow-up appointment.  Your physician recommends that you return for a FASTING lipid profile: 6 months

## 2012-05-23 NOTE — Assessment & Plan Note (Signed)
He has a midline abdominal pulsatile mass associated with a soft bruit.  He has a long hx of cigarette smoking and is at risk for an abdominal aortic aneurism.  We will order an abdominal US to look for an AAA.  He has not had any symptoms

## 2012-05-23 NOTE — Progress Notes (Signed)
Tom [name] Date of Birth  14-Dec-1950 [facility] [address]    [address] [place], [place]  [address] [phone]  Fax  [phone]  Problem list: 1. Coronary artery disease-status post CABG. February 2007. He had a LIMA  to the LAD and a SVG to the right coronary artery. 2. Hypertension 3. Hyperlipidemia 4. Renal insufficiency  History of Present Illness:  61 y.o. year-old gentleman with a history of coronary disease. He status post coronary artery bypass grafting in February 2007. He had a left internal mammary artery to the LAD and a saphenous vein graft to the right coronary artery. He also has a history of hypercholesterolemia, hypertension, and ongoing cigarette smoking.  He's not had any episodes of chest pain, shortness breath, syncope, or presyncope.  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 He does not do any regular aerobic exercise.  He's not had any recurrent episodes of chest pain. He tries to watch his salt intake.  He still smokes about a half a pack of cigarettes a day but has cut back from 2 packs a day.  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   Current Outpatient Prescriptions on File Prior to Visit  Medication Sig Dispense Refill  . ALPRAZolam (XANAX) 0.25 MG tablet Take 0.25 mg by mouth daily.        [name] Kitchen amLODipine (NORVASC) 5 MG tablet Take 1 tablet (5 mg total) by mouth daily.  30 tablet  6  . aspirin 325 MG tablet Take 325 mg by mouth daily.      [name] Kitchen atorvastatin (LIPITOR) 40 MG tablet Take 40 mg by mouth daily.        . metoprolol (TOPROL-XL) 100 MG 24 hr tablet Take 100 mg by mouth daily.        . Misc Natural Products (OSTEO BI-FLEX ADV TRIPLE ST) TABS Take 2 tablets by mouth daily.        . nitroGLYCERIN (NITROSTAT) 0.4 MG SL tablet Place 0.4 mg under the tongue every 5 (five) minutes as needed.        . pantoprazole (PROTONIX) 40 MG tablet Take 40 mg by mouth daily.        . valsartan (DIOVAN) 320  MG tablet Take 1 tablet (320 mg total) by mouth daily.  30 tablet  5  . ZETIA 10 MG tablet TAKE 1 TABLET BY MOUTH DAILY  30 tablet  6    Allergies  Allergen Reactions  . Codeine   . Niacin     Past Medical History  Diagnosis Date  . Anxiety   . Renal insufficiency   . Hypertension   . Hypercholesteremia   . Myocardial infarction 1997    Past Surgical History  Procedure Date  . Coronary artery bypass graft 09/2005    x2 LIMA to LAD saphenous vein to RCA    History  Smoking status  . Current Some Day Smoker -- 0.5 packs/day for 44 years  . Types: Cigarettes  Smokeless tobacco  . Never Used  Comment: less than 1 ppd    History  Alcohol Use No    Family History  Problem Relation Age of Onset  . Heart disease Father   . Heart disease Mother   . Hypertension Father   . Hyperlipidemia Father   . Diabetes Father   . Hypertension Mother   . Hyperlipidemia Mother     Reviw of Systems:  Reviewed  in the HPI.  All other systems are negative.  Physical Exam: BP 136/84  Pulse 56  Ht 5\' 10"  (1.778 m)  Wt 188 lb 12.8 oz (85.639 kg)  BMI 27.09 kg/m2 The patient is alert and oriented x 3.  The mood and affect are normal.   Skin: warm and dry.  Color is normal.    HEENT:   the sclera are nonicteric.  The mucous membranes are moist.  The carotids are 2+ without bruits.  There is no thyromegaly.  There is no JVD.    Lungs: clear.  The chest wall is non tender.    Heart: regular rate with a normal S1 and S2.  There are no murmurs, gallops, or rubs. The PMI is not displaced.     Abdomen: good bowel sounds.  There is no guarding or rebound.  There is a soft bruit associated with a slight pulsitile mass.  Extremities:  no clubbing, cyanosis, or edema.  The legs are without rashes.  The distal pulses are intact.   Neuro:  Cranial nerves II - XII are intact.  Motor and sensory functions are intact.    The gait is normal.  ECG: Oct. 3, 2013 - sinus brady at 53.   Borderline LAE.   Assessment / Plan:

## 2012-06-06 ENCOUNTER — Ambulatory Visit (INDEPENDENT_AMBULATORY_CARE_PROVIDER_SITE_OTHER)
Admission: RE | Admit: 2012-06-06 | Discharge: 2012-06-06 | Disposition: A | Discharge status: Home / Self Care | Payer: BC Managed Care – PPO | Source: Ambulatory Visit | Attending: Cardiovascular Disease | Admitting: Cardiovascular Disease

## 2012-06-06 ENCOUNTER — Encounter: Payer: Self-pay | Admitting: Vascular Surgery

## 2012-06-06 ENCOUNTER — Ambulatory Visit (INDEPENDENT_AMBULATORY_CARE_PROVIDER_SITE_OTHER): Payer: BC Managed Care – PPO | Admitting: Vascular Surgery

## 2012-06-06 ENCOUNTER — Other Ambulatory Visit: Payer: Self-pay | Admitting: Cardiovascular Disease

## 2012-06-06 ENCOUNTER — Encounter (INDEPENDENT_AMBULATORY_CARE_PROVIDER_SITE_OTHER): Payer: BC Managed Care – PPO

## 2012-06-06 ENCOUNTER — Telehealth: Payer: Self-pay | Admitting: *Deleted

## 2012-06-06 DIAGNOSIS — Z01818 Encounter for other preprocedural examination: Secondary | ICD-10-CM

## 2012-06-06 DIAGNOSIS — I714 Abdominal aortic aneurysm, without rupture, unspecified: Secondary | ICD-10-CM

## 2012-06-06 DIAGNOSIS — R0989 Other specified symptoms and signs involving the circulatory and respiratory systems: Secondary | ICD-10-CM

## 2012-06-06 DIAGNOSIS — I700 Atherosclerosis of aorta: Secondary | ICD-10-CM

## 2012-06-06 DIAGNOSIS — R1900 Intra-abdominal and pelvic swelling, mass and lump, unspecified site: Secondary | ICD-10-CM

## 2012-06-06 MED ORDER — IOHEXOL 350 MG/ML SOLN
100.0000 mL | Freq: Once | INTRAVENOUS | Status: AC | PRN
  Administered 2012-06-06: 100 mL via INTRAVENOUS

## 2012-06-06 NOTE — Telephone Encounter (Signed)
Pt in vascular department, 6 cm AAA, Spoke with [name] and orders followed. Pt set for CT chest/ pelvic, lexiscan and VVS referral. Discussed plan with pt, he agreed to plan.

## 2012-06-06 NOTE — Progress Notes (Signed)
[facility] [place] HISTORY AND PHYSICAL   History of Present Illness:  Patient is a 61 y.o. year old male who presents for evaluation of abdominal aortic aneurysm.  The aneurysm is currently 5.8 cm in diameter by CT performed today.  The patient denies abdominal pain.  The patient denies back pain.  The patient hahas family history of AAA.  His maternal uncle had an abdominal aortic aneurysm. The aneurysm was first detected by Dr. Noss her today who evaluated the patient with ultrasound after hearing an abdominal bruit. The ultrasound at [facility] measured the aneurysm to be 5.9 x 6.4 cm. Other medical problems include hypertension, elevated cholesterol, COPD, coronary disease.  These are all currently stable. He also has a history of renal insufficiency with a baseline creatinine of 1.4. He did have slightly decreased GFR.  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 He has good exercise tolerance without significant shortness of breath or chest pain.  Past Medical History  Diagnosis Date  . Anxiety   . Renal insufficiency   . Hypertension   . Hypercholesteremia   . AAA (abdominal aortic aneurysm)   . Cancer 2013    skin cancer,   MOHS surgery  . COPD (chronic obstructive pulmonary disease)   . Myocardial infarction 1997    Past Surgical History  Procedure Date  . Coronary artery bypass graft 09/2005    x2 LIMA to LAD saphenous vein to RCA     Social History History  Substance Use Topics  . Smoking status: Current Some Day Smoker -- 0.5 packs/day for 44 years    Types: Cigarettes  . Smokeless tobacco: Never Used   Comment: less than 1 ppd  . Alcohol Use: No    Family History Family History  Problem Relation Age of Onset  . Heart disease Father   . Hypertension Father   . Hyperlipidemia Father   . Diabetes Father   . Heart disease Mother   . Hypertension Mother   . Hyperlipidemia Mother   . AAA (abdominal aortic aneurysm) Maternal  Uncle   . Cancer Maternal Uncle     Allergies  Allergies  Allergen Reactions  . Codeine   . Niacin      Current Outpatient Prescriptions  Medication Sig Dispense Refill  . ALPRAZolam (XANAX) 0.25 MG tablet Take 0.25 mg by mouth daily.        . amLODipine (NORVASC) 5 MG tablet Take 1 tablet (5 mg total) by mouth daily.  30 tablet  6  . aspirin 325 MG tablet Take 325 mg by mouth daily.      . atorvastatin (LIPITOR) 40 MG tablet Take 40 mg by mouth daily.        . metoprolol (TOPROL-XL) 100 MG 24 hr tablet Take 100 mg by mouth daily.        . Misc Natural Products (OSTEO BI-FLEX ADV TRIPLE ST) TABS Take 2 tablets by mouth daily.        . nitroGLYCERIN (NITROSTAT) 0.4 MG SL tablet Place 1 tablet (0.4 mg total) under the tongue every 5 (five) minutes as needed.  25 tablet  6  . pantoprazole (PROTONIX) 40 MG tablet Take 40 mg by mouth daily.        . valsartan (DIOVAN) 320 MG tablet Take 1 tablet (320 mg total) by mouth daily.  30 tablet  5  . ZETIA 10 MG tablet TAKE 1 TABLET BY MOUTH DAILY  30 tablet  6     No current facility-administered medications for this visit.   Facility-Administered Medications Ordered in Other Visits  Medication Dose Route Frequency Provider Last Rate Last Dose  . iohexol (OMNIPAQUE) 350 MG/ML injection 100 mL  100 mL Intravenous Once PRN Medication Radiologist, MD   100 mL at 06/06/12 1109    ROS:   General:  No weight loss, Fever, chills  HEENT: No recent headaches, no nasal bleeding, no visual changes, no sore throat  Neurologic: No dizziness, blackouts, seizures. No recent symptoms of stroke or mini- stroke. No recent episodes of slurred speech, or temporary blindness.  Cardiac: No recent episodes of chest pain/pressure, no shortness of breath at rest.  + shortness of breath with exertion.  Denies history of atrial fibrillation or irregular heartbeat  Vascular: No history of rest pain in feet.  No history of claudication.  No history of non-healing  ulcer, No history of DVT   Pulmonary: No home oxygen, no productive cough, no hemoptysis,  No asthma or wheezing  Musculoskeletal:  [ ] Arthritis, [ ] Low back pain,  [ ] Joint pain  Hematologic:No history of hypercoagulable state.  No history of easy bleeding.  No history of anemia  Gastrointestinal: No hematochezia or melena,  No gastroesophageal reflux, no trouble swallowing  Urinary: [x ] chronic Kidney disease, [ ] on HD - [ ] MWF or [ ] TTHS, [ ] Burning with urination, [ ] Frequent urination, [ ] Difficulty urinating;   Skin: No rashes  Psychological: No history of anxiety,  No history of depression   Physical Examination General:  Alert and oriented, no acute distress HEENT: Normal Neck: No bruit or JVD Pulmonary: Clear to auscultation bilaterally Cardiac: Regular Rate and Rhythm without murmur Gastrointestinal: Soft, non-tender, non-distended, palpable pulsatile mass in epigastrium Skin: No rash Extremity Pulses:  2+ radial, brachial, femoral, dorsalis pedis, posterior tibial pulses bilaterally Musculoskeletal: No deformity or edema  Neurologic: Upper and lower extremity motor 5/5 and symmetric  DATA:   I reviewed the patient's CT angiogram the abdomen and pelvis today. He has bilateral single renal arteries. These are both patent. The aneurysm is 5.8 cm in diameter without evidence of rupture. The aortic neck diameter is between 22 and 23 mm. The right external iliac measures 9.7 mm. The left external iliac measures 9.0 millimeters. There is some calcification in the left common femoral artery.   ASSESSMENT:   Asymptomatic 5.8 cm infrarenal abdominal aortic aneurysm.   PLAN:  Review of CT scan shows the patient would be a candidate for Gore Excluder stent graft repair. I discussed with the patient today risks benefits possible complications of aneurysm stent graft in. Also discussed with the patient the risks benefits possible complications of open aneurysm repair. He has  elected to undergo stent graft repair. This is scheduled for October 23.   Alvino Lechuga, MD [facility] [place] Office: [phone] Pager: [phone]  

## 2012-06-07 ENCOUNTER — Other Ambulatory Visit: Payer: Self-pay | Admitting: *Deleted

## 2012-06-10 ENCOUNTER — Ambulatory Visit (HOSPITAL_COMMUNITY): Payer: BC Managed Care – PPO | Attending: Cardiology | Admitting: Radiology

## 2012-06-10 ENCOUNTER — Encounter (HOSPITAL_COMMUNITY): Payer: Self-pay | Admitting: Pharmacy Technician

## 2012-06-10 VITALS — BP 133/76 | Ht 70.0 in | Wt 188.0 lb

## 2012-06-10 DIAGNOSIS — Z01818 Encounter for other preprocedural examination: Secondary | ICD-10-CM

## 2012-06-10 DIAGNOSIS — Z8249 Family history of ischemic heart disease and other diseases of the circulatory system: Secondary | ICD-10-CM | POA: Insufficient documentation

## 2012-06-10 DIAGNOSIS — R0989 Other specified symptoms and signs involving the circulatory and respiratory systems: Secondary | ICD-10-CM | POA: Insufficient documentation

## 2012-06-10 DIAGNOSIS — F17200 Nicotine dependence, unspecified, uncomplicated: Secondary | ICD-10-CM | POA: Insufficient documentation

## 2012-06-10 DIAGNOSIS — I4949 Other premature depolarization: Secondary | ICD-10-CM

## 2012-06-10 DIAGNOSIS — I714 Abdominal aortic aneurysm, without rupture: Secondary | ICD-10-CM

## 2012-06-10 DIAGNOSIS — I10 Essential (primary) hypertension: Secondary | ICD-10-CM | POA: Insufficient documentation

## 2012-06-10 DIAGNOSIS — R0609 Other forms of dyspnea: Secondary | ICD-10-CM | POA: Insufficient documentation

## 2012-06-10 MED ORDER — REGADENOSON 0.4 MG/5ML IV SOLN
0.4000 mg | Freq: Once | INTRAVENOUS | Status: AC
  Administered 2012-06-10: 0.4 mg via INTRAVENOUS

## 2012-06-10 MED ORDER — TECHNETIUM TC 99M SESTAMIBI GENERIC - CARDIOLITE
33.0000 | Freq: Once | INTRAVENOUS | Status: AC | PRN
  Administered 2012-06-10: 33 via INTRAVENOUS

## 2012-06-10 MED ORDER — TECHNETIUM TC 99M SESTAMIBI GENERIC - CARDIOLITE
11.0000 | Freq: Once | INTRAVENOUS | Status: AC | PRN
  Administered 2012-06-10: 11 via INTRAVENOUS

## 2012-06-10 NOTE — Progress Notes (Signed)
[facility] SITE 3 NUCLEAR MED [id number] [place] [address] [phone]  Cardiology Nuclear Med Study  Tom [name] is a 61 y.o. male     MRN : [id number]     DOB: 1951/07/12  Procedure Date: 06/10/2012  Nuclear Med Background Indication for Stress Test:  Evaluation for Ischemia, Surgical Clearance for AAA with Dr. Darrick Penna 06/12/12 and Graft Patency History:  2/07 Heart Cath: EF: 55-60%-CABG x2, '09 MPS: EF: 59% mild peri-infarct ischemia,  06/06/12: AAA  per CT 5.8 cm Cardiac Risk Factors: Family History - CAD, Hypertension, Lipids and Smoker  Symptoms:  DOE   Nuclear Pre-Procedure Caffeine/Decaff Intake:  None NPO After: 8:30am   Lungs:  clear O2 Sat: 94% on room air. IV 0.9% NS with Angio Cath:  20g  IV Site: R Hand  IV Started by:  Cathlyn Parsons, RN  Chest Size (in):  42 Cup Size: n/a  Height: 5\' 10"  (1.778 m)  Weight:  188 lb (85.276 kg)  BMI:  Body mass index is 26.98 kg/(m^2). Tech Comments:  Toprol held x 36 hrs    Nuclear Med Study 1 or 2 day study: 1 day  Stress Test Type:  Lexiscan  Reading MD: Dietrich Pates, MD  Order Authorizing Provider:  Jannette Spanner  Resting Radionuclide: Technetium 64m Sestamibi  Resting Radionuclide Dose: 11.0 mCi   Stress Radionuclide:  Technetium 65m Sestamibi  Stress Radionuclide Dose: 33.0 mCi           Stress Protocol Rest HR: 56 Stress HR: 93  Rest BP: 133/76 Stress BP: 149/74  Exercise Time (min): n/a METS: n/a   Predicted Max HR: 160 bpm % Max HR: 58.12 bpm Rate Pressure Product: 8463   Dose of Adenosine (mg):  n/a Dose of Lexiscan: 0.4 mg  Dose of Atropine (mg): n/a Dose of Dobutamine: n/a mcg/kg/min (at max HR)  Stress Test Technologist: Milana Na, EMT-P  Nuclear Technologist:  Domenic Polite, CNMT     Rest Procedure:  Myocardial perfusion imaging was performed at rest 45 minutes following the intravenous administration of Technetium 26m Tetrofosmin. Rest ECG: Sinus  Bradycardia  Stress Procedure:  The patient received IV Lexiscan 0.4 mg over 15-seconds.  Technetium 59m Tetrofosmin injected at 30-seconds.  There were no significant changes, chest pressure, and a rare pvc with Lexiscan.  Quantitative spect images were obtained after a 45 minute delay. Stress ECG: No significant change from baseline ECG  QPS Raw Data Images:  Images were motion corrected.  SOft tissue (diaprhagm) underlies heart. Stress Images:  Thinning with decreased counts in the inferior wall (base, mid) inferolateral wall (base).  Mild apical thinning  Otherwise normal perfusion. Rest Images:  No significant change from the stress image. Subtraction (SDS):  No evidence of ischemia. Transient Ischemic Dilatation (Normal <1.22):  1.04 Lung/Heart Ratio (Normal <0.45):  0.33  Quantitative Gated Spect Images QGS EDV:  119 ml QGS ESV:  48 ml  Impression Exercise Capacity:  Lexiscan with no exercise. BP Response:  Normal blood pressure response. Clinical Symptoms:  Mild chest pain/dyspnea. ECG Impression:  No significant ST segment change suggestive of ischemia. Comparison with Prior Nuclear Study: No change from previous report.  Overall Impression:  Probable normal perfusion and mild soft tissue attenuation (diaphragm)  NO significant ischemia or scar.  LV Ejection Fraction: 60%.  LV Wall Motion:  NL LV Function; NL Wall Motion Dietrich Pates

## 2012-06-11 ENCOUNTER — Telehealth: Payer: Self-pay | Admitting: *Deleted

## 2012-06-11 ENCOUNTER — Encounter (HOSPITAL_COMMUNITY)
Admission: RE | Admit: 2012-06-11 | Discharge: 2012-06-11 | Disposition: A | Discharge status: Home / Self Care | Payer: BC Managed Care – PPO | Source: Ambulatory Visit | Attending: Vascular Surgery | Admitting: Vascular Surgery

## 2012-06-11 ENCOUNTER — Encounter (HOSPITAL_COMMUNITY): Payer: Self-pay

## 2012-06-11 HISTORY — DX: Atherosclerotic heart disease of native coronary artery without angina pectoris: I25.10

## 2012-06-11 HISTORY — DX: Gastro-esophageal reflux disease without esophagitis: K21.9

## 2012-06-11 LAB — COMPREHENSIVE METABOLIC PANEL
Albumin: 4 g/dL (ref 3.5–5.2)
Alkaline Phosphatase: 89 U/L (ref 39–117)
BUN: 13 mg/dL (ref 6–23)
Chloride: 103 mEq/L (ref 96–112)
Potassium: 4.2 mEq/L (ref 3.5–5.1)
Total Bilirubin: 0.3 mg/dL (ref 0.3–1.2)

## 2012-06-11 LAB — BLOOD GAS, ARTERIAL
Bicarbonate: 23 mEq/L (ref 20.0–24.0)
TCO2: 24.2 mmol/L (ref 0–100)
pCO2 arterial: 40.2 mmHg (ref 35.0–45.0)
pH, Arterial: 7.375 (ref 7.350–7.450)
pO2, Arterial: 69.6 mmHg — ABNORMAL LOW (ref 80.0–100.0)

## 2012-06-11 LAB — CBC
HCT: 44.4 % (ref 39.0–52.0)
Hemoglobin: 15.8 g/dL (ref 13.0–17.0)
RBC: 5.07 MIL/uL (ref 4.22–5.81)
RDW: 13 % (ref 11.5–15.5)
WBC: 11.5 10*3/uL — ABNORMAL HIGH (ref 4.0–10.5)

## 2012-06-11 LAB — TYPE AND SCREEN: ABO/RH(D): A NEG

## 2012-06-11 LAB — URINALYSIS, ROUTINE W REFLEX MICROSCOPIC
Glucose, UA: NEGATIVE mg/dL
Hgb urine dipstick: NEGATIVE
Protein, ur: NEGATIVE mg/dL
Specific Gravity, Urine: 1.005 (ref 1.005–1.030)

## 2012-06-11 MED ORDER — DEXTROSE 5 % IV SOLN
1.5000 g | INTRAVENOUS | Status: AC
  Administered 2012-06-12: 1.5 g via INTRAVENOUS
  Filled 2012-06-11: qty 1.5

## 2012-06-11 MED ORDER — SODIUM CHLORIDE 0.9 % IV SOLN: INTRAVENOUS | Status: DC

## 2012-06-11 NOTE — Telephone Encounter (Signed)
Stress test results given 

## 2012-06-11 NOTE — Pre-Procedure Instructions (Signed)
[address] Tom [name]  06/11/2012   Your procedure is scheduled on:  Wednesday June 12, 2012 at 0830 AM  Report to [facility] at 0630 AM.  Call this number if you have problems the morning of surgery: [phone]   Remember:   Do not eat food or drink:After Midnight.Tuesday      Take these medicines the morning of surgery with A SIP OF WATER: Amlodipine,Metoprolol, and Protonix   Do not wear jewelry,  Do not wear lotions, powders, or perfumes. You may wear deodorant.             Men may shave face and neck.  Do not bring valuables to the hospital.  Contacts, dentures or bridgework may not be worn into surgery.  Leave suitcase in the car. After surgery it may be brought to your room.  For patients admitted to the hospital, checkout time is 11:00 AM the day of discharge.   Patients discharged the day of surgery will not be allowed to drive home.    Special Instructions: Shower using CHG 2 nights before surgery and the night before surgery.  If you shower the day of surgery use CHG.  Use special wash - you have one bottle of CHG for all showers.  You should use approximately 1/3 of the bottle for each shower.   Please read over the following fact sheets that you were given: Pain Booklet, Coughing and Deep Breathing, Blood Transfusion Information, MRSA Information and Surgical Site Infection Prevention

## 2012-06-12 ENCOUNTER — Inpatient Hospital Stay (HOSPITAL_COMMUNITY): Payer: BC Managed Care – PPO | Admitting: Certified Registered"

## 2012-06-12 ENCOUNTER — Inpatient Hospital Stay (HOSPITAL_COMMUNITY)
Admission: RE | Admit: 2012-06-12 | Discharge: 2012-06-13 | DRG: 110 | Disposition: A | Discharge status: Home / Self Care | Payer: BC Managed Care – PPO | Source: Ambulatory Visit | Attending: Vascular Surgery | Admitting: Vascular Surgery

## 2012-06-12 ENCOUNTER — Encounter (HOSPITAL_COMMUNITY): Payer: Self-pay | Admitting: Surgery

## 2012-06-12 ENCOUNTER — Encounter (HOSPITAL_COMMUNITY): Payer: Self-pay | Admitting: Certified Registered"

## 2012-06-12 ENCOUNTER — Telehealth: Payer: Self-pay | Admitting: Vascular Surgery

## 2012-06-12 ENCOUNTER — Other Ambulatory Visit: Payer: Self-pay | Admitting: *Deleted

## 2012-06-12 ENCOUNTER — Inpatient Hospital Stay (HOSPITAL_COMMUNITY): Admit: 2012-06-12 | Payer: Self-pay

## 2012-06-12 ENCOUNTER — Inpatient Hospital Stay (HOSPITAL_COMMUNITY): Payer: BC Managed Care – PPO

## 2012-06-12 ENCOUNTER — Encounter (HOSPITAL_COMMUNITY)
Admission: RE | Disposition: A | Discharge status: Home / Self Care | Payer: Self-pay | Source: Ambulatory Visit | Attending: Vascular Surgery

## 2012-06-12 DIAGNOSIS — Z48812 Encounter for surgical aftercare following surgery on the circulatory system: Secondary | ICD-10-CM

## 2012-06-12 DIAGNOSIS — I708 Atherosclerosis of other arteries: Secondary | ICD-10-CM | POA: Diagnosis present

## 2012-06-12 DIAGNOSIS — I129 Hypertensive chronic kidney disease with stage 1 through stage 4 chronic kidney disease, or unspecified chronic kidney disease: Secondary | ICD-10-CM | POA: Diagnosis present

## 2012-06-12 DIAGNOSIS — J449 Chronic obstructive pulmonary disease, unspecified: Secondary | ICD-10-CM | POA: Diagnosis present

## 2012-06-12 DIAGNOSIS — I70219 Atherosclerosis of native arteries of extremities with intermittent claudication, unspecified extremity: Secondary | ICD-10-CM

## 2012-06-12 DIAGNOSIS — Z8249 Family history of ischemic heart disease and other diseases of the circulatory system: Secondary | ICD-10-CM

## 2012-06-12 DIAGNOSIS — I714 Abdominal aortic aneurysm, without rupture, unspecified: Principal | ICD-10-CM | POA: Diagnosis present

## 2012-06-12 DIAGNOSIS — Z951 Presence of aortocoronary bypass graft: Secondary | ICD-10-CM

## 2012-06-12 DIAGNOSIS — Z833 Family history of diabetes mellitus: Secondary | ICD-10-CM

## 2012-06-12 DIAGNOSIS — J4489 Other specified chronic obstructive pulmonary disease: Secondary | ICD-10-CM | POA: Diagnosis present

## 2012-06-12 DIAGNOSIS — N189 Chronic kidney disease, unspecified: Secondary | ICD-10-CM | POA: Diagnosis present

## 2012-06-12 DIAGNOSIS — I743 Embolism and thrombosis of arteries of the lower extremities: Secondary | ICD-10-CM

## 2012-06-12 DIAGNOSIS — F411 Generalized anxiety disorder: Secondary | ICD-10-CM | POA: Diagnosis present

## 2012-06-12 DIAGNOSIS — K219 Gastro-esophageal reflux disease without esophagitis: Secondary | ICD-10-CM | POA: Diagnosis present

## 2012-06-12 DIAGNOSIS — F17200 Nicotine dependence, unspecified, uncomplicated: Secondary | ICD-10-CM | POA: Diagnosis present

## 2012-06-12 DIAGNOSIS — E7800 Pure hypercholesterolemia, unspecified: Secondary | ICD-10-CM | POA: Diagnosis present

## 2012-06-12 DIAGNOSIS — I2510 Atherosclerotic heart disease of native coronary artery without angina pectoris: Secondary | ICD-10-CM | POA: Diagnosis present

## 2012-06-12 HISTORY — PX: ABDOMINAL AORTIC ANEURYSM REPAIR: SUR1152

## 2012-06-12 LAB — CBC
MCH: 30.3 pg (ref 26.0–34.0)
MCHC: 33.8 g/dL (ref 30.0–36.0)
Platelets: 211 10*3/uL (ref 150–400)
RBC: 4.29 MIL/uL (ref 4.22–5.81)

## 2012-06-12 LAB — BASIC METABOLIC PANEL
Calcium: 8.2 mg/dL — ABNORMAL LOW (ref 8.4–10.5)
GFR calc non Af Amer: 63 mL/min — ABNORMAL LOW (ref >90)
Potassium: 4.6 mEq/L (ref 3.5–5.1)
Sodium: 140 mEq/L (ref 135–145)

## 2012-06-12 LAB — PROTIME-INR
INR: 1.18 (ref 0.00–1.49)
Prothrombin Time: 14.8 seconds (ref 11.6–15.2)

## 2012-06-12 SURGERY — INSERTION, ENDOVASCULAR STENT GRAFT, AORTA, ABDOMINAL
Anesthesia: General | Site: Groin | Wound class: Clean

## 2012-06-12 MED ORDER — EPHEDRINE SULFATE 50 MG/ML IJ SOLN
INTRAMUSCULAR | Status: DC | PRN
  Administered 2012-06-12: 15 mg via INTRAVENOUS

## 2012-06-12 MED ORDER — MIDAZOLAM HCL 5 MG/5ML IJ SOLN
INTRAMUSCULAR | Status: DC | PRN
  Administered 2012-06-12: 2 mg via INTRAVENOUS
  Administered 2012-06-12 (×2): 0.5 mg via INTRAVENOUS

## 2012-06-12 MED ORDER — MORPHINE SULFATE 4 MG/ML IJ SOLN
INTRAMUSCULAR | Status: AC
  Administered 2012-06-12: 4 mg
  Filled 2012-06-12: qty 1

## 2012-06-12 MED ORDER — DOPAMINE-DEXTROSE 3.2-5 MG/ML-% IV SOLN: 3.0000 ug/kg/min | INTRAVENOUS | Status: DC

## 2012-06-12 MED ORDER — ARTIFICIAL TEARS OP OINT
TOPICAL_OINTMENT | OPHTHALMIC | Status: DC | PRN
  Administered 2012-06-12: 1 via OPHTHALMIC

## 2012-06-12 MED ORDER — 0.9 % SODIUM CHLORIDE (POUR BTL) OPTIME
TOPICAL | Status: DC | PRN
  Administered 2012-06-12 (×3): 1000 mL

## 2012-06-12 MED ORDER — SODIUM CHLORIDE 0.9 % IV SOLN
INTRAVENOUS | Status: DC
  Administered 2012-06-12: 18:00:00 via INTRAVENOUS

## 2012-06-12 MED ORDER — ROCURONIUM BROMIDE 100 MG/10ML IV SOLN
INTRAVENOUS | Status: DC | PRN
  Administered 2012-06-12 (×4): 10 mg via INTRAVENOUS
  Administered 2012-06-12: 50 mg via INTRAVENOUS
  Administered 2012-06-12: 10 mg via INTRAVENOUS

## 2012-06-12 MED ORDER — GUAIFENESIN-DM 100-10 MG/5ML PO SYRP: 15.0000 mL | ORAL_SOLUTION | ORAL | Status: DC | PRN

## 2012-06-12 MED ORDER — PANTOPRAZOLE SODIUM 40 MG PO TBEC
40.0000 mg | DELAYED_RELEASE_TABLET | Freq: Every day | ORAL | Status: DC
  Administered 2012-06-13: 40 mg via ORAL
  Filled 2012-06-12: qty 1

## 2012-06-12 MED ORDER — PROPOFOL 10 MG/ML IV EMUL
INTRAVENOUS | Status: DC | PRN
  Administered 2012-06-12: 200 mg via INTRAVENOUS

## 2012-06-12 MED ORDER — IODIXANOL 320 MG/ML IV SOLN
INTRAVENOUS | Status: DC | PRN
  Administered 2012-06-12: 150 mL via INTRA_ARTERIAL

## 2012-06-12 MED ORDER — EZETIMIBE 10 MG PO TABS
10.0000 mg | ORAL_TABLET | Freq: Every day | ORAL | Status: DC
  Administered 2012-06-13: 10 mg via ORAL
  Filled 2012-06-12: qty 1

## 2012-06-12 MED ORDER — HEPARIN SODIUM (PORCINE) 1000 UNIT/ML IJ SOLN
INTRAMUSCULAR | Status: DC | PRN
  Administered 2012-06-12 (×2): 10000 [IU] via INTRAVENOUS
  Administered 2012-06-12: 3000 [IU] via INTRAVENOUS

## 2012-06-12 MED ORDER — LACTATED RINGERS IV SOLN
INTRAVENOUS | Status: DC | PRN
  Administered 2012-06-12 (×3): via INTRAVENOUS

## 2012-06-12 MED ORDER — ATORVASTATIN CALCIUM 40 MG PO TABS
40.0000 mg | ORAL_TABLET | Freq: Every day | ORAL | Status: DC
  Administered 2012-06-13: 40 mg via ORAL
  Filled 2012-06-12: qty 1

## 2012-06-12 MED ORDER — IRBESARTAN 300 MG PO TABS
300.0000 mg | ORAL_TABLET | Freq: Every day | ORAL | Status: DC
  Filled 2012-06-12: qty 1

## 2012-06-12 MED ORDER — HYDRALAZINE HCL 20 MG/ML IJ SOLN
10.0000 mg | INTRAMUSCULAR | Status: DC | PRN
  Administered 2012-06-12: 10 mg via INTRAVENOUS

## 2012-06-12 MED ORDER — PHENOL 1.4 % MT LIQD: 1.0000 | OROMUCOSAL | Status: DC | PRN

## 2012-06-12 MED ORDER — FENTANYL CITRATE 0.05 MG/ML IJ SOLN
INTRAMUSCULAR | Status: DC | PRN
  Administered 2012-06-12: 100 ug via INTRAVENOUS
  Administered 2012-06-12 (×7): 50 ug via INTRAVENOUS
  Administered 2012-06-12 (×2): 100 ug via INTRAVENOUS
  Administered 2012-06-12: 50 ug via INTRAVENOUS

## 2012-06-12 MED ORDER — ACETAMINOPHEN 650 MG RE SUPP: 325.0000 mg | RECTAL | Status: DC | PRN

## 2012-06-12 MED ORDER — METOPROLOL TARTRATE 1 MG/ML IV SOLN: 2.0000 mg | INTRAVENOUS | Status: DC | PRN

## 2012-06-12 MED ORDER — MAGNESIUM SULFATE 40 MG/ML IJ SOLN
2.0000 g | Freq: Every day | INTRAMUSCULAR | Status: DC | PRN
  Filled 2012-06-12: qty 50

## 2012-06-12 MED ORDER — ACETAMINOPHEN 325 MG PO TABS: 325.0000 mg | ORAL_TABLET | ORAL | Status: DC | PRN

## 2012-06-12 MED ORDER — NITROGLYCERIN 0.4 MG SL SUBL: 0.4000 mg | SUBLINGUAL_TABLET | SUBLINGUAL | Status: DC | PRN

## 2012-06-12 MED ORDER — HYDRALAZINE HCL 20 MG/ML IJ SOLN
INTRAMUSCULAR | Status: AC
  Filled 2012-06-12: qty 1

## 2012-06-12 MED ORDER — THROMBIN 20000 UNITS EX SOLR
CUTANEOUS | Status: AC
  Filled 2012-06-12: qty 20000

## 2012-06-12 MED ORDER — DOCUSATE SODIUM 100 MG PO CAPS
100.0000 mg | ORAL_CAPSULE | Freq: Every day | ORAL | Status: DC
  Administered 2012-06-13: 100 mg via ORAL
  Filled 2012-06-12: qty 1

## 2012-06-12 MED ORDER — THROMBIN 20000 UNITS EX SOLR
CUTANEOUS | Status: DC | PRN
  Administered 2012-06-12: 14:00:00 via TOPICAL

## 2012-06-12 MED ORDER — POTASSIUM CHLORIDE CRYS ER 20 MEQ PO TBCR: 20.0000 meq | EXTENDED_RELEASE_TABLET | Freq: Every day | ORAL | Status: DC | PRN

## 2012-06-12 MED ORDER — INFLUENZA VIRUS VACC SPLIT PF IM SUSP
0.5000 mL | INTRAMUSCULAR | Status: AC
  Administered 2012-06-13: 0.5 mL via INTRAMUSCULAR
  Filled 2012-06-12: qty 0.5

## 2012-06-12 MED ORDER — MORPHINE SULFATE 2 MG/ML IJ SOLN
2.0000 mg | INTRAMUSCULAR | Status: DC | PRN
  Administered 2012-06-12 – 2012-06-13 (×3): 2 mg via INTRAVENOUS
  Filled 2012-06-12 (×2): qty 1

## 2012-06-12 MED ORDER — SODIUM CHLORIDE 0.9 % IR SOLN
Status: DC | PRN
  Administered 2012-06-12 (×2)

## 2012-06-12 MED ORDER — TRAMADOL HCL 50 MG PO TABS
50.0000 mg | ORAL_TABLET | Freq: Four times a day (QID) | ORAL | Status: DC | PRN
  Administered 2012-06-12 – 2012-06-13 (×2): 50 mg via ORAL
  Filled 2012-06-12 (×2): qty 1

## 2012-06-12 MED ORDER — ASPIRIN 325 MG PO TABS
325.0000 mg | ORAL_TABLET | Freq: Every day | ORAL | Status: DC
  Administered 2012-06-13: 325 mg via ORAL
  Filled 2012-06-12: qty 1

## 2012-06-12 MED ORDER — DOPAMINE-DEXTROSE 3.2-5 MG/ML-% IV SOLN: 2.0000 ug/kg/min | INTRAVENOUS | Status: DC

## 2012-06-12 MED ORDER — SODIUM CHLORIDE 0.9 % IV SOLN: 500.0000 mL | Freq: Once | INTRAVENOUS | Status: AC | PRN

## 2012-06-12 MED ORDER — ONDANSETRON HCL 4 MG/2ML IJ SOLN
INTRAMUSCULAR | Status: DC | PRN
  Administered 2012-06-12 (×2): 4 mg via INTRAVENOUS

## 2012-06-12 MED ORDER — AMLODIPINE BESYLATE 5 MG PO TABS
5.0000 mg | ORAL_TABLET | Freq: Every day | ORAL | Status: DC
  Administered 2012-06-13: 5 mg via ORAL
  Filled 2012-06-12: qty 1

## 2012-06-12 MED ORDER — FENTANYL CITRATE 0.05 MG/ML IJ SOLN: 25.0000 ug | INTRAMUSCULAR | Status: DC | PRN

## 2012-06-12 MED ORDER — LACTATED RINGERS IV SOLN
INTRAVENOUS | Status: DC | PRN
  Administered 2012-06-12 (×2): via INTRAVENOUS

## 2012-06-12 MED ORDER — LIDOCAINE HCL (CARDIAC) 20 MG/ML IV SOLN
INTRAVENOUS | Status: DC | PRN
  Administered 2012-06-12: 50 mg via INTRAVENOUS

## 2012-06-12 MED ORDER — MORPHINE SULFATE 2 MG/ML IJ SOLN
INTRAMUSCULAR | Status: AC
  Filled 2012-06-12: qty 1

## 2012-06-12 MED ORDER — SODIUM CHLORIDE 0.9 % IV SOLN
10.0000 mg | INTRAVENOUS | Status: DC | PRN
  Administered 2012-06-12: 20 ug/min via INTRAVENOUS

## 2012-06-12 MED ORDER — PNEUMOCOCCAL VAC POLYVALENT 25 MCG/0.5ML IJ INJ
0.5000 mL | INJECTION | INTRAMUSCULAR | Status: AC
  Administered 2012-06-13: 0.5 mL via INTRAMUSCULAR
  Filled 2012-06-12: qty 0.5

## 2012-06-12 MED ORDER — METOPROLOL SUCCINATE ER 100 MG PO TB24
100.0000 mg | ORAL_TABLET | Freq: Every day | ORAL | Status: DC
  Administered 2012-06-13: 100 mg via ORAL
  Filled 2012-06-12: qty 1

## 2012-06-12 MED ORDER — DEXTROSE 5 % IV SOLN
1.5000 g | Freq: Two times a day (BID) | INTRAVENOUS | Status: AC
  Administered 2012-06-12 – 2012-06-13 (×2): 1.5 g via INTRAVENOUS
  Filled 2012-06-12 (×3): qty 1.5

## 2012-06-12 MED ORDER — PROTAMINE SULFATE 10 MG/ML IV SOLN
INTRAVENOUS | Status: DC | PRN
  Administered 2012-06-12: 10 mg via INTRAVENOUS
  Administered 2012-06-12 (×3): 20 mg via INTRAVENOUS
  Administered 2012-06-12: 10 mg via INTRAVENOUS
  Administered 2012-06-12: 20 mg via INTRAVENOUS

## 2012-06-12 MED ORDER — LABETALOL HCL 5 MG/ML IV SOLN: 10.0000 mg | INTRAVENOUS | Status: DC | PRN

## 2012-06-12 MED ORDER — GLYCOPYRROLATE 0.2 MG/ML IJ SOLN
INTRAMUSCULAR | Status: DC | PRN
  Administered 2012-06-12 (×2): 0.2 mg via INTRAVENOUS

## 2012-06-12 MED ORDER — ONDANSETRON HCL 4 MG/2ML IJ SOLN: 4.0000 mg | Freq: Four times a day (QID) | INTRAMUSCULAR | Status: DC | PRN

## 2012-06-12 SURGICAL SUPPLY — 114 items
ADH SKN CLS APL DERMABOND .7 (GAUZE/BANDAGES/DRESSINGS) ×4
BAG BANDED W/RUBBER/TAPE 36X54 (MISCELLANEOUS) ×3 IMPLANT
BAG EQP BAND 135X91 W/RBR TAPE (MISCELLANEOUS) ×2
BAG ISL DRAPE 18X18 STRL (DRAPES) ×4
BAG ISOLATION DRAPE 18X18 (DRAPES) IMPLANT
BAG SNAP BAND KOVER 36X36 (MISCELLANEOUS) ×3 IMPLANT
BALLN CODA OCL 2-9.0-35-120-3 (BALLOONS) ×3
BALLN POWERFLEX 10X7X80 (BALLOONS) ×1 IMPLANT
BALLOON COD OCL 2-9.0-35-120-3 (BALLOONS) ×2 IMPLANT
BOOT SUTURE AID YELLOW STND (SUTURE) ×1 IMPLANT
CANISTER SUCTION 2500CC (MISCELLANEOUS) ×3 IMPLANT
CATH BEACON 5.038 65CM KMP-01 (CATHETERS) ×1 IMPLANT
CATH EMB 4FR 80CM (CATHETERS) ×1 IMPLANT
CATH OMNI FLUSH .035X70CM (CATHETERS) ×1 IMPLANT
CLIP TI MEDIUM 24 (CLIP) ×1 IMPLANT
CLIP TI WIDE RED SMALL 24 (CLIP) ×1 IMPLANT
CLOTH BEACON ORANGE TIMEOUT ST (SAFETY) ×3 IMPLANT
COVER DOME SNAP 22 D (MISCELLANEOUS) ×3 IMPLANT
COVER MAYO STAND STRL (DRAPES) ×3 IMPLANT
COVER PROBE W GEL 5X96 (DRAPES) ×3 IMPLANT
COVER SURGICAL LIGHT HANDLE (MISCELLANEOUS) ×3 IMPLANT
DERMABOND ADVANCED (GAUZE/BANDAGES/DRESSINGS) ×2
DERMABOND ADVANCED .7 DNX12 (GAUZE/BANDAGES/DRESSINGS) ×2 IMPLANT
DEVICE CLOSURE PERCLS PRGLD 6F (VASCULAR PRODUCTS) IMPLANT
DEVICE TORQUE H2O (MISCELLANEOUS) ×1 IMPLANT
DRAIN CHANNEL 10F 3/8 F FF (DRAIN) IMPLANT
DRAIN CHANNEL 10M FLAT 3/4 FLT (DRAIN) IMPLANT
DRAPE INCISE IOBAN 66X45 STRL (DRAPES) ×1 IMPLANT
DRAPE ISOLATION BAG 18X18 (DRAPES) ×2
DRAPE TABLE COVER HEAVY DUTY (DRAPES) ×3 IMPLANT
DRESSING OPSITE X SMALL 2X3 (GAUZE/BANDAGES/DRESSINGS) ×2 IMPLANT
DRYSEAL FLEXSHEATH 12FR 33CM (SHEATH) ×1
DRYSEAL FLEXSHEATH 18FR 33CM (SHEATH) ×1
ELECT CAUTERY BLADE 6.4 (BLADE) ×3 IMPLANT
ELECT REM PT RETURN 9FT ADLT (ELECTROSURGICAL) ×6
ELECTRODE REM PT RTRN 9FT ADLT (ELECTROSURGICAL) ×4 IMPLANT
EVACUATOR 3/16  PVC DRAIN (DRAIN)
EVACUATOR 3/16 PVC DRAIN (DRAIN) IMPLANT
EVACUATOR SILICONE 100CC (DRAIN) IMPLANT
EXCLUDER TRUNK (Endovascular Graft) ×1 IMPLANT
GAUZE SPONGE 2X2 8PLY STRL LF (GAUZE/BANDAGES/DRESSINGS) IMPLANT
GAUZE SPONGE 4X4 16PLY XRAY LF (GAUZE/BANDAGES/DRESSINGS) ×2 IMPLANT
GEL ULTRASOUND 20GR AQUASONIC (MISCELLANEOUS) ×1 IMPLANT
GLOVE BIO SURGEON STRL SZ 6 (GLOVE) ×2 IMPLANT
GLOVE BIO SURGEON STRL SZ 6.5 (GLOVE) ×2 IMPLANT
GLOVE BIO SURGEON STRL SZ7 (GLOVE) ×2 IMPLANT
GLOVE BIO SURGEON STRL SZ7.5 (GLOVE) ×5 IMPLANT
GLOVE BIOGEL PI IND STRL 6.5 (GLOVE) IMPLANT
GLOVE BIOGEL PI IND STRL 7.0 (GLOVE) IMPLANT
GLOVE BIOGEL PI IND STRL 7.5 (GLOVE) IMPLANT
GLOVE BIOGEL PI INDICATOR 6.5 (GLOVE) ×2
GLOVE BIOGEL PI INDICATOR 7.0 (GLOVE) ×1
GLOVE BIOGEL PI INDICATOR 7.5 (GLOVE) ×2
GLOVE ECLIPSE 6.0 STRL STRAW (GLOVE) ×1 IMPLANT
GLOVE SURG SS PI 7.0 STRL IVOR (GLOVE) ×3 IMPLANT
GOWN PREVENTION PLUS XLARGE (GOWN DISPOSABLE) ×4 IMPLANT
GOWN STRL NON-REIN LRG LVL3 (GOWN DISPOSABLE) ×12 IMPLANT
GOWN STRL REIN XL XLG (GOWN DISPOSABLE) ×3 IMPLANT
GRAFT BALLN CATH 65CM (STENTS) IMPLANT
GRAFT EXCLUDER LEG 14.5X14 (Endovascular Graft) ×1 IMPLANT
GUIDEWIRE ANGLED .035X150CM (WIRE) ×1 IMPLANT
KIT BASIN OR (CUSTOM PROCEDURE TRAY) ×3 IMPLANT
KIT ENCORE 26 ADVANTAGE (KITS) ×1 IMPLANT
KIT ROOM TURNOVER OR (KITS) ×3 IMPLANT
LOOP VESSEL MAXI BLUE (MISCELLANEOUS) ×3 IMPLANT
LOOP VESSEL MINI RED (MISCELLANEOUS) ×3 IMPLANT
NDL PERC 18GX7CM (NEEDLE) ×2 IMPLANT
NEEDLE PERC 18GX7CM (NEEDLE) ×3 IMPLANT
NS IRRIG 1000ML POUR BTL (IV SOLUTION) ×6 IMPLANT
PACK AORTA (CUSTOM PROCEDURE TRAY) ×3 IMPLANT
PACK UNIVERSAL I (CUSTOM PROCEDURE TRAY) ×1 IMPLANT
PAD ARMBOARD 7.5X6 YLW CONV (MISCELLANEOUS) ×6 IMPLANT
PATCH VASCULAR VASCU GUARD 1X6 (Vascular Products) ×2 IMPLANT
PENCIL BUTTON HOLSTER BLD 10FT (ELECTRODE) ×2 IMPLANT
PERCLOSE PROGLIDE 6F (VASCULAR PRODUCTS) ×15
PROTECTION STATION PRESSURIZED (MISCELLANEOUS) ×3
SHEATH AVANTI 11CM 8FR (MISCELLANEOUS) ×1 IMPLANT
SHEATH DRYSEAL FLEX 12FR 33CM (SHEATH) IMPLANT
SHEATH DRYSEAL FLEX 18FR 33CM (SHEATH) IMPLANT
SPONGE GAUZE 2X2 STER 10/PKG (GAUZE/BANDAGES/DRESSINGS)
SPONGE SURGIFOAM ABS GEL 100 (HEMOSTASIS) IMPLANT
STAPLER VISISTAT 35W (STAPLE) IMPLANT
STATION PROTECTION PRESSURIZED (MISCELLANEOUS) IMPLANT
STENT GRAFT BALLN CATH 65CM (STENTS) ×1
STOPCOCK 4 WAY LG BORE MALE ST (IV SETS) ×1 IMPLANT
STOPCOCK MORSE 400PSI 3WAY (MISCELLANEOUS) ×3 IMPLANT
SUT PROLENE 5 0 C 1 24 (SUTURE) ×7 IMPLANT
SUT PROLENE 6 0 CC (SUTURE) ×4 IMPLANT
SUT SILK 2 0 (SUTURE) ×3
SUT SILK 2-0 18XBRD TIE 12 (SUTURE) IMPLANT
SUT SILK 3 0 (SUTURE) ×3
SUT SILK 3-0 18XBRD TIE 12 (SUTURE) IMPLANT
SUT SILK 4 0 (SUTURE) ×3
SUT SILK 4-0 18XBRD TIE 12 (SUTURE) IMPLANT
SUT VIC AB 2-0 CT1 27 (SUTURE) ×6
SUT VIC AB 2-0 CT1 TAPERPNT 27 (SUTURE) IMPLANT
SUT VIC AB 3-0 SH 27 (SUTURE) ×6
SUT VIC AB 3-0 SH 27X BRD (SUTURE) IMPLANT
SUT VICRYL 4-0 PS2 18IN ABS (SUTURE) ×8 IMPLANT
SYR 20CC LL (SYRINGE) ×7 IMPLANT
SYR 20ML ECCENTRIC (SYRINGE) ×1 IMPLANT
SYR 30ML LL (SYRINGE) IMPLANT
SYR 3ML LL SCALE MARK (SYRINGE) ×1 IMPLANT
SYR 5ML LL (SYRINGE) ×3 IMPLANT
SYR MEDRAD MARK V 150ML (SYRINGE) ×3 IMPLANT
SYRINGE 10CC LL (SYRINGE) ×9 IMPLANT
TOWEL OR 17X24 6PK STRL BLUE (TOWEL DISPOSABLE) ×7 IMPLANT
TOWEL OR 17X26 10 PK STRL BLUE (TOWEL DISPOSABLE) ×6 IMPLANT
TRAY FOLEY CATH 14FRSI W/METER (CATHETERS) ×3 IMPLANT
TUBE CONNECTING 12X1/4 (SUCTIONS) ×1 IMPLANT
TUBING HIGH PRESSURE 120CM (CONNECTOR) ×3 IMPLANT
WATER STERILE IRR 1000ML POUR (IV SOLUTION) ×2 IMPLANT
WIRE AMPLATZ SS-J .035X180CM (WIRE) ×2 IMPLANT
WIRE BENTSON .035X145CM (WIRE) ×2 IMPLANT

## 2012-06-12 NOTE — Anesthesia Preprocedure Evaluation (Addendum)
Anesthesia Evaluation  Patient identified by MRN, date of birth, ID band Patient awake    Reviewed: Allergy & Precautions, H&P , NPO status , Patient's Chart, lab work & pertinent test results, reviewed documented beta blocker date and time   Airway Mallampati: I TM Distance: >3 FB Neck ROM: Full    Dental  (+) Teeth Intact and Dental Advisory Given   Pulmonary COPD breath sounds clear to auscultation        Cardiovascular hypertension, Pt. on medications and Pt. on home beta blockers + CAD and + Past MI Rhythm:Regular Rate:Bradycardia     Neuro/Psych    GI/Hepatic Neg liver ROS, GERD-  Medicated and Controlled,  Endo/Other    Renal/GU Renal diseasenegative Renal ROS     Musculoskeletal   Abdominal   Peds  Hematology   Anesthesia Other Findings   Reproductive/Obstetrics                          Anesthesia Physical Anesthesia Plan  ASA: III  Anesthesia Plan: General   Post-op Pain Management:    Induction: Intravenous  Airway Management Planned: Oral ETT  Additional Equipment:   Intra-op Plan:   Post-operative Plan: Possible Post-op intubation/ventilation  Informed Consent:   Plan Discussed with: CRNA and Anesthesiologist  Anesthesia Plan Comments:         Anesthesia Quick Evaluation

## 2012-06-12 NOTE — Op Note (Signed)
Procedure: Gore Excluder Stent Graft repair of abdominal aortic aneursym, Bilateral common femoral endarterectomy  Operative findings:   #1 Bilateral Proglide closure   #2 26x14x18 cm main body Gore Excluder device delivered via a right femoral system   #3 14 x 14 cm left iliac limb contralateral    #4 Bilateral bovine pericardial patch common femoral artery                          #5 90% stenosis left renal artery  PROCEDURE DETAIL: After obtaining informed consent the patient was taken to the operating room. The patient was placed in supine position the operating room table. After induction of general anesthesia and endotracheal intubation a Foley catheter was placed. Next the patient was prepped and draped in usual sterile fashion from the nipples down to the knees. Ultrasound was used to identify the right common femoral artery as well as the femoral bifurcation. There was some posterior plaque but I felt the artery was of reasonable quality to do the case percutaneously.  An 11 blade was used to make a small neck in the skin over the level of the right common femoral artery. An introducer needle was then used to cannulate the right common femoral artery without difficulty. A 0.035 Bentson wire was then threaded up into the abdominal aorta through the right femoral system. A short 9 French dilator was placed over the guidewire the right femoral system. This was used to dilate the tract. The dilator was then removed and a Proglide device inserted over the guidewire into the right femoral system and this was deployed at the 2:00 position. The Proglide device was then removed and an additional Proglide device was brought in operative field and deployed at the 10:00 position. The sutures were kept separate and tagged with suture tags. Next the short 9 French sheath was then placed back over the guidewire into the right femoral system and the dilator removed and the sheath thoroughly flushed with  heparinized saline. Attention was then turned to the left groin. Again using ultrasound the left common femoral artery was identified. The femoral bifurcation was also identified. A small nick was made in the skin with the 11 blade. A hemostat was used to dilate a tract down to the artery. An introducer needle was then used to cannulate the left common femoral artery without difficulty. A 0.035 Bentson wire was then threaded up into the abdominal aorta under fluoroscopic guidance. A 9 French dilator was then placed over the wire to dilate the tract. Two Proglide devices were again brought on operative field and these were fired sequentially in the 10:00 position followed by an additional Proglide at the 2:00 position. The Proglide delivery systems were removed and the long 9 French sheath placed back over the guidewire up to the level of the iliac of the aortic bifurcation.  At this point, a 5 [place] Omni flush catheter was placed over the guidewire and the left groin up and the abdominal aorta. An abdominal aortogram was obtained in the AP position to determine level of the left and right renal arteries. The patient was also noted to have a 90% stenosis of the left renal artery.  At this point a 26 x 14 x 18 Gore Excluder main body device was selected. The patient was given 10000 units of intravenous heparin.  The 9 Fr sheath on the right was exchanged for the 20 Fr Dry seal over the wire and  the left for the 18 Fr dry seal over the wire.  Both were thoroughly flushed with heparinized saline.  The Bentson wire from the right groin was advanced up into the descending thoracic aorta over a Kumpe catheter and the Bentsen wire replaced with an 035 Amplatz wire. The main body device was then placed over the Amplatz wire in the right groin and advanced up to the level of the renal arteries. Magnified views of the renal arteries with a slight cranial caudal angle were performed to make sure that we were not covering  these. The top portion of the stent graft was then deployed with the end of the stent just below the level of the right renal artery and this came over to just below the level of the left renal artery. The main body was delivered all the way down to the contralateral gate. Attention was then turned to the left groin. The Omni flush catheter was pulled down over a guidewire and removed and the Bentson wire placed in position to cannulate the contralateral gate. The Kumpe catheter was placed back over the guidewire in the left femoral system. A 5 [place] Kumpe catheter was placed over this and this was used to selectively catheterize the contralateral gate and the guidewire was then advanced into the descending thoracic aorta. The main body portion of the gate was confirmed by twirling the pigtail catheter. The pigtail  catheter was then placed in a location so that we could use its markers to determine the exact length to the iliac bifurcation. An Amplatz wire was placed back through the pigtail catheter. A retrograde contrast angiogram was performed to determine the level of the left internal iliac artery and a 14 x 14 cm iliac limb was selected. The pigtail catheter was removed over the guidewire and the 14 x 14cm limb advanced so there was full coverage of the long marker on the contralateral limb. This was then deployed in the usual fashion down to the iliac bifurcation. The delivery system was then removed. The remainder of the ipsilateral iliac limb was also deployed.  Attention was then turned back to the left iliac system and a Gore aortic balloon placed over the wire up to the level of the proximal end of the stent and this was ballooned to profile. The limb attachment site was also ballooned as well as the distal attachment site. Attention was then turned to the right groin and the balloon was advanced over the guidewire on the right side and the distal attachment site as well as the midportion of iliac limb  were also ballooned. The 5 [place] Omni flush catheter was then placed back to the guidewire on the right side and a completion arteriogram was obtained. This showed no evidence of proximal or distal type I endoleak. There was no type II endoleak. There is no filling of the aneurysm sac.  There was some narrowing of the common iliac artery bilateral so a 10 x40 mm angioplasty balloon was brought up on the operative field and first placed over the right femoral wire and the common iliac angioplastied to nominal pressure at 10 atm for one minute.  The procedure was then repeated on the contralateral side.  An additional completion angiogram performed showing no endoleak, with patent common internal external iliac and renal arteries bilaterally with no endoleak.    At this point the Omni flush catheter was removed over a guidewire. All delivery devices were removed. We then proceeded to remove the  large 18 French sheath from the right side with the guidewire in place. With pressure held above and below the insertion site the lateral Proglide closure was secured down.  An additional 12 oclock Proglide was then placed over the guidewire and deployed.  There was good hemostasis. The guidewire was removed from the right side. Attention was then turned to the left groin. In similar fashion the 12 French sheath was removed and the guidewire left in place. The lateral and medial Proglide was secured over the wire to obtain hemostasis.  There was good hemostasis and the Bentsen wire was removed from the left groin. The patient had been given 10000 units of heparin before introducing the main body device. Each groin puncture site was then closed with a running 4-0 Vicryl subcuticular stitch.  After taking the drapes down the patient was noted to have no palpable pedal pulses and no doppler signals.  At this point bilateral femoral cutdowns were performed.  The left groin operation is dictated separately by Dr Imogene Burn.  A  longitudinal incision was made in the right groin and carried down to the right common femoral artery.  There was a good pulse proximally but no pulse in the distal common femoral artery. The common femoral artery profunda and superficial femoral arteries were all dissected free circumferentially.  Vessel loops were placed around these.  The patient was given 10000 units of heparin and an additional 3000 units of heparin during the case.  A longitudinal opening was made in the right common femoral artery.  There was a large amount of posterior plaque and the proglide sutures had narrowed the artery even further obstructing flow.  I proceeded to do femoral endarterectomy extending from the proximal common femoral artery to the femoral bifurcation.  Good proximal and distal endpoints were obtained.  There was laceration extending halfway across the posterior wall of the artery and this was repaired with a running 6 0 Prolene suture.  All loose debris was removed from the arterial bed.  A bovine pericardial patch was brought up on the operative field and sewn on as a patch angioplasty.  Just prior to completion everything was forebled backbled and thoroughly flushed.  The anastomosis was secured and flow restored to the profunda followed by common femoral and SFA.  The patient had palpable PT and DP pulses bilaterally at this point and good doppler flow bilaterally as well.  The groin was closed in multiple layers of 2 0, 3 0 Vicryl in the subcutaneous tissue.  The skin was closed with a 4 0 Vicryl subcuticular stitch.  Dermabond was applied.  The patient was given 100 mg Protamine for heparin reversal.  The patient tolerated procedure well and there were no complications. Instrument sponge and needle counts correct in the case. Patient was awakened in the operating room extubated and taken to the recovery room in stable condition.  He had palpable DP and PT pulses at the end of the case.  Fabienne Bruns,  MD [facility] [place] Office: [phone] Pager: [phone]

## 2012-06-12 NOTE — H&P (View-Only) (Signed)
[facility] [place] HISTORY AND PHYSICAL   History of Present Illness:  Patient is a 61 y.o. year old male who presents for evaluation of abdominal aortic aneurysm.  The aneurysm is currently 5.8 cm in diameter by CT performed today.  The patient denies abdominal pain.  The patient denies back pain.  The patient hahas family history of AAA.  His maternal uncle had an abdominal aortic aneurysm. The aneurysm was first detected by Dr. Eldred Manges her today who evaluated the patient with ultrasound after hearing an abdominal bruit. The ultrasound at [facility] measured the aneurysm to be 5.9 x 6.4 cm. Other medical problems include hypertension, elevated cholesterol, COPD, coronary disease.  These are all currently stable. He also has a history of renal insufficiency with a baseline creatinine of 1.4. He did have slightly decreased GFR.  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 He has good exercise tolerance without significant shortness of breath or chest pain.  Past Medical History  Diagnosis Date  . Anxiety   . Renal insufficiency   . Hypertension   . Hypercholesteremia   . AAA (abdominal aortic aneurysm)   . Cancer 2013    skin cancer,   MOHS surgery  . COPD (chronic obstructive pulmonary disease)   . Myocardial infarction 1997    Past Surgical History  Procedure Date  . Coronary artery bypass graft 09/2005    x2 LIMA to LAD saphenous vein to RCA     Social History History  Substance Use Topics  . Smoking status: Current Some Day Smoker -- 0.5 packs/day for 44 years    Types: Cigarettes  . Smokeless tobacco: Never Used   Comment: less than 1 ppd  . Alcohol Use: No    Family History Family History  Problem Relation Age of Onset  . Heart disease Father   . Hypertension Father   . Hyperlipidemia Father   . Diabetes Father   . Heart disease Mother   . Hypertension Mother   . Hyperlipidemia Mother   . AAA (abdominal aortic aneurysm) Maternal  Uncle   . Cancer Maternal Uncle     Allergies  Allergies  Allergen Reactions  . Codeine   . Niacin      Current Outpatient Prescriptions  Medication Sig Dispense Refill  . ALPRAZolam (XANAX) 0.25 MG tablet Take 0.25 mg by mouth daily.        [name] Kitchen amLODipine (NORVASC) 5 MG tablet Take 1 tablet (5 mg total) by mouth daily.  30 tablet  6  . aspirin 325 MG tablet Take 325 mg by mouth daily.      [name] Kitchen atorvastatin (LIPITOR) 40 MG tablet Take 40 mg by mouth daily.        . metoprolol (TOPROL-XL) 100 MG 24 hr tablet Take 100 mg by mouth daily.        . Misc Natural Products (OSTEO BI-FLEX ADV TRIPLE ST) TABS Take 2 tablets by mouth daily.        . nitroGLYCERIN (NITROSTAT) 0.4 MG SL tablet Place 1 tablet (0.4 mg total) under the tongue every 5 (five) minutes as needed.  25 tablet  6  . pantoprazole (PROTONIX) 40 MG tablet Take 40 mg by mouth daily.        . valsartan (DIOVAN) 320 MG tablet Take 1 tablet (320 mg total) by mouth daily.  30 tablet  5  . ZETIA 10 MG tablet TAKE 1 TABLET BY MOUTH DAILY  30 tablet  6  No current facility-administered medications for this visit.   Facility-Administered Medications Ordered in Other Visits  Medication Dose Route Frequency Provider Last Rate Last Dose  . iohexol (OMNIPAQUE) 350 MG/ML injection 100 mL  100 mL Intravenous Once PRN Medication Radiologist, MD   100 mL at 06/06/12 1109    ROS:   General:  No weight loss, Fever, chills  HEENT: No recent headaches, no nasal bleeding, no visual changes, no sore throat  Neurologic: No dizziness, blackouts, seizures. No recent symptoms of stroke or mini- stroke. No recent episodes of slurred speech, or temporary blindness.  Cardiac: No recent episodes of chest pain/pressure, no shortness of breath at rest.  + shortness of breath with exertion.  Denies history of atrial fibrillation or irregular heartbeat  Vascular: No history of rest pain in feet.  No history of claudication.  No history of non-healing  ulcer, No history of DVT   Pulmonary: No home oxygen, no productive cough, no hemoptysis,  No asthma or wheezing  Musculoskeletal:  [ ]  Arthritis, [ ]  Low back pain,  [ ]  Joint pain  Hematologic:No history of hypercoagulable state.  No history of easy bleeding.  No history of anemia  Gastrointestinal: No hematochezia or melena,  No gastroesophageal reflux, no trouble swallowing  Urinary: [x ] chronic Kidney disease, [ ]  on HD - [ ]  MWF or [ ]  TTHS, [ ]  Burning with urination, [ ]  Frequent urination, [ ]  Difficulty urinating;   Skin: No rashes  Psychological: No history of anxiety,  No history of depression   Physical Examination General:  Alert and oriented, no acute distress HEENT: Normal Neck: No bruit or JVD Pulmonary: Clear to auscultation bilaterally Cardiac: Regular Rate and Rhythm without murmur Gastrointestinal: Soft, non-tender, non-distended, palpable pulsatile mass in epigastrium Skin: No rash Extremity Pulses:  2+ radial, brachial, femoral, dorsalis pedis, posterior tibial pulses bilaterally Musculoskeletal: No deformity or edema  Neurologic: Upper and lower extremity motor 5/5 and symmetric  DATA:   I reviewed the patient's CT angiogram the abdomen and pelvis today. He has bilateral single renal arteries. These are both patent. The aneurysm is 5.8 cm in diameter without evidence of rupture. The aortic neck diameter is between 22 and 23 mm. The right external iliac measures 9.7 mm. The left external iliac measures 9.0 millimeters. There is some calcification in the left common femoral artery.   ASSESSMENT:   Asymptomatic 5.8 cm infrarenal abdominal aortic aneurysm.   PLAN:  Review of CT scan shows the patient would be a candidate for Gore Excluder stent graft repair. I discussed with the patient today risks benefits possible complications of aneurysm stent graft in. Also discussed with the patient the risks benefits possible complications of open aneurysm repair. He has  elected to undergo stent graft repair. This is scheduled for October 23.   Fabienne Bruns, MD [facility] [place] Office: [phone] Pager: [phone]

## 2012-06-12 NOTE — Op Note (Signed)
OPERATIVE NOTE   PROCEDURE: 1. Bilateral iliofemoral endarterectomy with bovine patch angioplasty  PRE-OPERATIVE DIAGNOSIS: Acute occlusion of bilateral common femoral arteries after aortic endograft placement  POST-OPERATIVE DIAGNOSIS: same as above   CO-SURGEONS: Charles,Fields, MD;  Leonides Sake, MD  ASSISTANT(S): Doreatha Massed, PAC   ANESTHESIA: general  ESTIMATED BLOOD LOSS: 200 cc  FINDING(S): 1. Bilateral common femoral artery near occluded due to substantial posterior plaques in both legs 2. Palpable bilateral posterior tibial arteries at end of case  SPECIMEN(S):  Bilateral iliofemoral plaques  INDICATIONS:   Tom [name] is a 61 y.o. male who presents with acute ischemia of both leg after aortic endograft placement via pre-glide technique in bilateral common femoral artery.  I was felt that likely a complication from the Preglide technique had occurred in both groins, in the setting of known significant femoral artery disease.  The patient is aware the risks include but are not limited to: bleeding, infection, myocardial infarction, stroke, limb loss, nerve damage, need for additional procedures in the future, and wound complications.  The patient is aware of these risks and agreed to proceed.  DESCRIPTION: Prior to proceeding with the EVAR, this patient had previously been told he might need surgical exploration of both common femoral arteries in order to complete the aortic endograft placement.  The patient was already on the operating room table.  The prior drapes were removed and the patient was prepped and draped in the standard fashion for: bilateral femoropopliteal bypass.   Attention was turned to the left groin.  I made an incision over the common femoral artery on this side.  I was able to dissect out the left common femoral artery from the distal external iliac artery down to the femoral bifurcation.  There was pulse proximally in the external iliac artery but not  pulse distal to the proglide.  The patient was given 10000 units of Heparin intravenously.  I clamped off collateral vessels along with the distal external iliac artery, superficial femoral artery, and profunda femoral artery.  I made an arteriotomy and extended it with Pott's scissors.  It became readily apparent that there was a large posterior plaque which had compromised the lumen on this side, so an iliofemoral endarterectomy with bovine patch angioplasty would be necessary.  I extended the arteriotomy down to the femoral bifurcation and up to the external iliac artery.  Using a Cytogeneticist, I dissected out a posterior plaque which extended into the distal external iliac artery and down into the profunda femoral artery and superficial femoral artery.  I was able to get the plaque to feather out into the superficial femoral artery and was able to extract most of the plaque from the profunda artery orifice.  This plaque was dissected out of the left external iliac artery and removed.  I continued the endarterectomy until I felt it was not longer safe to endarterectomize the wall further.  I fashioned a previously soaking piece of bovine pericardial patch for this artery.  The patch was sewn to the left iliofemoral arterial segment with a two running stitch of 5-0 Prolene, taking bigger bites into the attenuated segments of the common femoral artery.  Prior to completing this patch angioplasty, I backbled the superficial femoral artery and profunda femoral artery getting good retrograde bleeding.  I also allowed the left external iliac artery to bleed in an antegrade fashion.  There was strong pulsatile antegrade bleeding.  The patch angioplasty was completed in the usual fashion.  The patient  was given Protamine to reverse anticoagulation.  Thrombin and gelfoam was applied to the surgical site.  A few areas of the patch angioplasty had to be repaired with interrupted 5-0 Prolene stitches.  At this point  there was a palpable left posterior tibial artery and dopplerable dorsalis pedis.  The left groin was repaired with a double layer of 2-0 Vicryl, a double layer of 3-0 Vicryl, and the skin was repaired with a running subcuticular of 4-0 Monocryl.  The skin was cleaned, dried, and reinforced with Dermabond.    In a similar fashion, Dr. Darrick Penna performed a similar procedure on the right iliofemoral arterial segment.  Details can be seen in his operative note.  COMPLICATIONS: none  CONDITION: stable  Leonides Sake, MD [facility] [place] Office: [phone] Pager: [phone]  06/12/2012, 2:46 PM

## 2012-06-12 NOTE — Progress Notes (Signed)
Pt admitted to 3300 from PACU.  VSS.  Bilateral DP/PT pulses palpable.  Bilateral groin incision sites OTA, soft, no drainage.  Oriented to unit, room, and call bell - placed at pt's side.  Family at bedside.  Roselie Awkward, RN

## 2012-06-12 NOTE — Op Note (Signed)
OPERATIVE NOTE   PROCEDURE:  1. Bilateral common femoral artery cannulation under ultrasound guidance 2. "Preclose" repair bilateral common femoral artery  3. Placement of catheter in aorta x 2 4. Aortogram 5. Repair of aorta with modular bifurcated prosthesis with one limb (26 mm x 14 mm x 18 cm) 6. Placement of left limb extension : 14 mm x 14 cm 7. Radiology S&I 8. Angioplasty of bilateral common iliac artery                                                                                         PRE-OPERATIVE DIAGNOSIS: abdominal aortic aneurysm and bilateral common iliac artery stenosis   POST-OPERATIVE DIAGNOSIS: same as above   CO-SURGEONS: Fabienne Bruns, MD, Leonides Sake, MD   ANESTHESIA: general   ESTIMATED BLOOD LOSS: 100 cc   FINDING(S):  1. Small type II endoleak leaks visualized 2. Bilateral renal arteries widely patent at end of case: L renal artery with >75% stenosis 3. Successful exclusion of abdominal aortic aneurysm without any obvious iliac arterial rupture 4. No Dopplerable bilateral pedal signals  INDICATIONS:  Tom [name] is a 61 y.o. male with large abdominal aortic aneurysm and bilateral common iliac artery stenosis. The patient is aware the risks of aortic surgery include but are not limited to: bleeding, need for transfusion, infection, death, stroke, paralysis, wound complications, impotence, bowel ischemia, extended ventilation and need for secondary procedures. Overall, I cited a mortality rate of 1-2% and morbidity rate of 15%. T  DESCRIPTION:After full informed written consent was obtained from the patient, he was brought back to the operating room, amd placed supine upon the operating table. He already had a A-line place. After obtaining adequate anesthesia, a Foley catheter was placed to monitor urine output in the operating room. He was prepped and draped in the standard fashion for either open aneurysm repair or endovascular aneurysm repair. I turned  my attention to the left groin. Under ultrasound guidance, Dr. Darrick Penna identified the right common femoral artery and made a stab incision over it.  He dissected down to the artery in the groin with a hemostat and then cannulated it with a 18-gauge needle. I then passed a Bentson wire up into the iliac system. Initially, a 8-French sheath was loaded over the wire and used to dilate up this tract. The first ProGlide was placed and rotated medially. The second ProGlide was placed and rotated laterally. In such fashion, Dr. Edilia Bo completed the pre-close technique on this side and then replaced the Bentson wire up into the aorta.  A short 8-Fr sheath was placed on this side.  A pigtail catheter was loaded over the wire up to L1.   At this point then the left groin was cannulated by myself in a similar fashion to the right side. The [facility] wire was passed into the aorta without difficulty.  The same pre-close technique was then utilized on this side. A long 8-Fr sheath was placed over the wire.  The pigtailed catheter was then connected to the power injector circuit.  An aortogram was completed to identify the length of main body needed.  26 mm x 14  mm x 18 cm was selected.  The right wire was exchanged an Amplatz wire. The sheath was then exchanged for a 18-Fr Dryseal sheath, which was advanced until hubbed.  The pigtail was loaded over the left wire to L1.  The patient was given a therapeutic bolus for the patient, 10000 units of Heparin.  Dr. Darrick Penna delivered the main body device, which was a 26-mm x 14-mm x 18-cm, which he delivered in the infrarenal position, the lowest renal artery being the right.  At this point, the sheath on the left side was exchanged for a 12-French sheath. The patient was given a therapeutic bolus for the patient.   A power injector aortogram was completed, which verified patency of both renal arteries.  The lowest artery was the right renal artery.   At this point, I pulled the catheter and  wire inferior to the contralateral gate.  The catheter was exchanged for KMP catheter.  Using the KMP and [facility] wire, I was able to cannulate the contralateral gate which was in a cross limb configuration. I was then able to advance both of these through the graft. I exchanged the catheter for a a pigtail catheter. The pigtail was then pulled down into the graft and rotated to demonstrate successful cannulation of the graft. I then pulled the catheter down to the flow bifurcation and then pulled the sheath back on the left side and did a retrograde injection from the sheath demonstrating the level of the internal iliac artery. Then subsequently based on the measurements, a 14-mm x 14 cm contralateral limb was selected. I replaced the dilator in the left sheath and advanced the sheath with dilator until hubbed.  The 14 mm x 14 cm contralateral limb was then placed through this sheath and then the sheath was pulled back to appropriate location. The iliac extension was then deployed with adequate overlap.   At this point, Dr. Darrick Penna fully deployed the main body, fully extending the partially constrained limb and also releasing the device from the delivery system. The delivery system was then removed. We then obtained a molding balloon which was used to mold the top, the flow divider and the ends of each iliac limb, and all overlapping segment. Additional inflation were completed by me to angioplasty the left common iliac artery stenosis. The pigtail catheter was replaced on the right side and then a completion aortogram was completed. This demonstrated a small type II endoleak. A repeat completion aortogram was completed which demonstrated bilateral common iliac artery stenoses.  A 10 mm x 40 mm angioplasty balloon was selected and used to treat the bilateral common iliac artery stenosis.  The completion angiogram demonstrated good exclusion of the abdominal aortic aneurysm and resolution of the bilateral common iliac  artery stenoses.   Subsequently, we turned our attention to repairing each groin. The left groin was repaired by cinching down the ProGlide devices in two stages. In the first stage, the wire was left in place after removing the sheath. There was absolutely no drop in pressure with removal of the sheath and then there was minimal blood loss with the wire in place. The wire was then removed from the right groin and the white tightening stitches was pulled to fully tighten the Proglide devices. All four sutures were held in place with a hemostat with tension on the skin. In a similar fashion, the right groin was repaired. There was no further significant bleeding from either groin cannulation site so the sutures were  transected with the suture transection device. Each groin was repaired with a U stitch of 4-0 Monocryl. Each foot was then tested: both had no dopplerable pedals signals throughout.  Subsequently, bilateral femoral artery exploration was planned immediately.  SPECIMEN(S): none   COMPLICATIONS: bilateral femoral occlusion  CONDITION: stable   Leonides Sake, MD [facility] [place] Office: [phone] Pager: [phone]  06/12/2012, 2:28 PM

## 2012-06-12 NOTE — Anesthesia Postprocedure Evaluation (Signed)
  Anesthesia Post-op Note  Patient: Tom [name]  Procedure(s) Performed: Procedure(s) (LRB) with comments: ABDOMINAL AORTIC ENDOVASCULAR STENT GRAFT (N/A) - GORE EXCLUDER; Ultrasound guided. PATCH ANGIOPLASTY (Bilateral) ENDARTERECTOMY FEMORAL (Bilateral)  Patient Location: PACU  Anesthesia Type: General  Level of Consciousness: awake  Airway and Oxygen Therapy: Patient Spontanous Breathing  Post-op Pain: mild  Post-op Assessment: Post-op Vital signs reviewed  Post-op Vital Signs: Reviewed  Complications: No apparent anesthesia complications

## 2012-06-12 NOTE — Preoperative (Signed)
Beta Blockers   Reason not to administer Beta Blockers:Not Applicable, last dose 06/12/12 at 05:30

## 2012-06-12 NOTE — Brief Op Note (Signed)
06/12/2012  2:22 PM  PATIENT:  Tom [name]  61 y.o. male  PRE-OPERATIVE DIAGNOSIS:  Abdominal Aortic Aneurysm  POST-OPERATIVE DIAGNOSIS:  Abdominal Aortic Aneurysm  PROCEDURE:  Procedure(s) (LRB) with comments: ABDOMINAL AORTIC ENDOVASCULAR STENT GRAFT (N/A) - GORE EXCLUDER; Ultrasound guided. PATCH ANGIOPLASTY (Bilateral) ENDARTERECTOMY FEMORAL (Bilateral)  SURGEON:  Surgeon(s) and Role:    * Sherren Kerns, MD - Co Surgeon    * Fransisco Hertz, MD - Co Surgeon  PHYSICIAN ASSISTANT: Rhyne PA-C  Palpable DP PT pulses at conclusion of case  Fabienne Bruns, MD [facility] [place] Office: [phone] Pager: [phone]

## 2012-06-12 NOTE — Anesthesia Procedure Notes (Signed)
Procedure Name: Intubation Date/Time: 06/12/2012 9:00 AM Performed by: Jefm Miles E Pre-anesthesia Checklist: Patient identified, Timeout performed, Emergency Drugs available, Suction available and Patient being monitored Patient Re-evaluated:Patient Re-evaluated prior to inductionOxygen Delivery Method: Circle system utilized Preoxygenation: Pre-oxygenation with 100% oxygen Intubation Type: IV induction Ventilation: Mask ventilation without difficulty Laryngoscope Size: Mac and 3 Grade View: Grade I Tube type: Oral Tube size: 7.5 mm Number of attempts: 1 Airway Equipment and Method: Stylet Placement Confirmation: ETT inserted through vocal cords under direct vision,  positive ETCO2 and breath sounds checked- equal and bilateral Secured at: 23 cm Tube secured with: Tape Dental Injury: Teeth and Oropharynx as per pre-operative assessment

## 2012-06-12 NOTE — Transfer of Care (Signed)
Immediate Anesthesia Transfer of Care Note  Patient: Tom [name]  Procedure(s) Performed: Procedure(s) (LRB) with comments: ABDOMINAL AORTIC ENDOVASCULAR STENT GRAFT (N/A) - GORE EXCLUDER; Ultrasound guided. PATCH ANGIOPLASTY (Bilateral) ENDARTERECTOMY FEMORAL (Bilateral)  Patient Location: PACU  Anesthesia Type: General  Level of Consciousness: awake  Airway & Oxygen Therapy: Patient Spontanous Breathing and Patient connected to face mask oxygen  Post-op Assessment: Report given to PACU RN, Post -op Vital signs reviewed and stable and Patient moving all extremities  Post vital signs: Reviewed and stable  Complications: No apparent anesthesia complications

## 2012-06-12 NOTE — Interval H&P Note (Signed)
History and Physical Interval Note:  06/12/2012 8:40 AM  Tom [name]  has presented today for surgery, with the diagnosis of AAA  The various methods of treatment have been discussed with the patient and family. After consideration of risks, benefits and other options for treatment, the patient has consented to  Procedure(s) (LRB) with comments: ABDOMINAL AORTIC ENDOVASCULAR STENT GRAFT (N/A) - GORE EXCLUDER as a surgical intervention .  The patient's history has been reviewed, patient examined, no change in status, stable for surgery.  I have reviewed the patient's chart and labs.  Questions were answered to the patient's satisfaction.     Walt Geathers E

## 2012-06-12 NOTE — Telephone Encounter (Addendum)
Message copied by Shari Prows on Wed Jun 12, 2012  3:42 PM ------      Message from: Melene Plan      Created: Wed Jun 12, 2012 12:10 PM                   ----- Message -----         From: Marlowe Shores, PA         Sent: 06/12/2012  10:54 AM           To: Melene Plan, RN            4 week F/U EVAR with CTA abd and pelvis - Fields  I scheduled an appt for the patient on 07/11/12 at 1:15pm with [facility]. And he is to have a cta prior at [address] location. I left a pt a message about the appt and also mailed a letter.awt

## 2012-06-13 LAB — CBC
Platelets: 194 10*3/uL (ref 150–400)
RBC: 3.77 MIL/uL — ABNORMAL LOW (ref 4.22–5.81)
WBC: 11.7 10*3/uL — ABNORMAL HIGH (ref 4.0–10.5)

## 2012-06-13 LAB — BASIC METABOLIC PANEL
CO2: 24 mEq/L (ref 19–32)
Calcium: 8.3 mg/dL — ABNORMAL LOW (ref 8.4–10.5)
Chloride: 103 mEq/L (ref 96–112)
Sodium: 137 mEq/L (ref 135–145)

## 2012-06-13 MED ORDER — TRAMADOL HCL 50 MG PO TABS: 50.0000 mg | ORAL_TABLET | Freq: Four times a day (QID) | ORAL | Status: DC | PRN

## 2012-06-13 NOTE — Progress Notes (Signed)
Utilization Review Completed.  

## 2012-06-13 NOTE — Progress Notes (Signed)
[facility] [place]  Subjective  - No complaints, some groin soreness  Objective 122/60 69 99.6 F (37.6 C) (Oral) 13 94%  Intake/Output Summary (Last 24 hours) at 06/13/12 0720 Last data filed at 06/13/12 0400  Gross per 24 hour  Intake   3100 ml  Output   2525 ml  Net    575 ml   No groin hematoma Abdomen soft non tender 2+ DP PT pulses bilat  Assessment/Planning: Foley out Ambulate  D/C today to home Will need follow up in 2 weeks Will address left renal stenosis after groins healed in 4-6 weeks  Saif Peter E 06/13/2012 7:20 AM --  Laboratory Lab Results:  Basename 06/13/12 0505 06/12/12 1430  WBC 11.7* 14.8*  HGB 11.2* 13.0  HCT 33.2* 38.5*  PLT 194 211   BMET  Basename 06/13/12 0505 06/12/12 1430  NA 137 140  K 3.8 4.6  CL 103 109  CO2 24 24  GLUCOSE 106* 91  BUN 13 14  CREATININE 1.28 1.21  CALCIUM 8.3* 8.2*    COAG Lab Results  Component Value Date   INR 1.18 06/12/2012   INR 0.96 06/11/2012   No results found for this basename: PTT    Antibiotics Anti-infectives     Start     Dose/Rate Route Frequency Ordered Stop   06/12/12 2000   cefUROXime (ZINACEF) 1.5 g in dextrose 5 % 50 mL IVPB        1.5 g 100 mL/hr over 30 Minutes Intravenous Every 12 hours 06/12/12 1814 06/13/12 1959   06/11/12 1426   cefUROXime (ZINACEF) 1.5 g in dextrose 5 % 50 mL IVPB        1.5 g 100 mL/hr over 30 Minutes Intravenous 30 min pre-op 06/11/12 1426 06/12/12 0905

## 2012-06-13 NOTE — Discharge Summary (Signed)
[facility] Discharge Summary   Patient ID:  Tom [name] MRN: [id number] DOB/AGE: 1951-07-31 61 y.o.  Admit date: 06/12/2012 Discharge date: 06/13/2012 Date of Surgery: 06/12/2012 Surgeon: Surgeon(s): Sherren Kerns, MD Fransisco Hertz, MD  Admission Diagnosis: AAA  Discharge Diagnoses:  AAA  Secondary Diagnoses: Past Medical History  Diagnosis Date  . Anxiety   . Renal insufficiency   . Hypertension   . Hypercholesteremia   . AAA (abdominal aortic aneurysm)   . Cancer 2013    skin cancer,   [facility] surgery  . COPD (chronic obstructive pulmonary disease)   . Myocardial infarction 1997  . Complication of anesthesia     hard to sedate  . Coronary artery disease   . GERD (gastroesophageal reflux disease)     Procedure(s): ABDOMINAL AORTIC ENDOVASCULAR STENT GRAFT PATCH ANGIOPLASTY ENDARTERECTOMY FEMORAL  Discharged Condition: good  HPI: Patient is a 61 y.o. year old male who presents for evaluation of abdominal aortic aneurysm. The aneurysm is currently 5.8 cm in diameter by CT performed today. The patient denies abdominal pain. The patient denies back pain. The patient hahas family history of AAA. His maternal uncle had an abdominal aortic aneurysm. The aneurysm was first detected by Dr. Eldred Manges her today who evaluated the patient with ultrasound after hearing an abdominal bruit. [facility] at [facility] measured the aneurysm to be 5.9 x 6.4 cm. Other medical problems include hypertension, elevated cholesterol, COPD, coronary disease. These are all currently stable. He also has a history of renal insufficiency with a baseline creatinine of 1.4. He did have slightly decreased GFR. 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 He has good exercise tolerance without significant shortness of breath or chest pain.    Hospital Course:  Tom [name] is a 61 y.o. male is S/P Bilateral Procedure(s): ABDOMINAL AORTIC ENDOVASCULAR STENT GRAFT PATCH  ANGIOPLASTY ENDARTERECTOMY FEMORAL Extubated: POD # 0 Post-op wounds clean, dry, intact or healing well Pt. Ambulating, voiding and taking PO diet without difficulty. Pt pain controlled with PO pain meds. Labs as below Complications:none  Consults:     Significant Diagnostic Studies: CBC Lab Results  Component Value Date   WBC 11.7* 06/13/2012   HGB 11.2* 06/13/2012   HCT 33.2* 06/13/2012   MCV 88.1 06/13/2012   PLT 194 06/13/2012    BMET    Component Value Date/Time   NA 137 06/13/2012 0505   K 3.8 06/13/2012 0505   CL 103 06/13/2012 0505   CO2 24 06/13/2012 0505   GLUCOSE 106* 06/13/2012 0505   BUN 13 06/13/2012 0505   CREATININE 1.28 06/13/2012 0505   CALCIUM 8.3* 06/13/2012 0505   GFRNONAA 59* 06/13/2012 0505   GFRAA 69* 06/13/2012 0505   COAG Lab Results  Component Value Date   INR 1.18 06/12/2012   INR 0.96 06/11/2012     Disposition:  Discharge to :Home Discharge Orders    Future Appointments: Provider: Department: Dept Phone: Center:   07/11/2012 1:15 PM Sherren Kerns, MD [facility] [phone] [facility]     Future Orders Please Complete By Expires   Resume previous diet      Driving Restrictions      Comments:   No driving for 4 weeks   Lifting restrictions      Comments:   No lifting for 6 weeks   Call MD for:  temperature >100.5      Call MD for:  redness, tenderness, or signs of infection (pain, swelling, bleeding, redness,  odor or green/yellow discharge around incision site)      Call MD for:  severe or increased pain, loss or decreased feeling  in affected limb(s)      Increase activity slowly      Comments:   Walk with assistance use walker or cane as needed   May shower       Scheduling Instructions:   Friday   Remove dressing in 24 hours      may wash over wound with mild soap and water      ABDOMINAL PROCEDURE/ANEURYSM REPAIR/AORTO-BIFEMORAL BYPASS:  Call MD for increased abdominal pain; cramping diarrhea; nausea/vomiting       Discharge patient      Comments:   Discharge pt to home      Tom [name], Tom [name]  Home Medication Instructions [id number]   Printed on:06/13/12 0749  Medication Information                    metoprolol (TOPROL-XL) 100 MG 24 hr tablet Take 100 mg by mouth daily.            atorvastatin (LIPITOR) 40 MG tablet Take 40 mg by mouth daily.             pantoprazole (PROTONIX) 40 MG tablet Take 40 mg by mouth daily.             Misc Natural Products (OSTEO BI-FLEX ADV TRIPLE ST) TABS Take 2 tablets by mouth daily.             aspirin 325 MG tablet Take 325 mg by mouth daily.           alprazolam (XANAX) 2 MG tablet Take 2 mg by mouth at bedtime.           ezetimibe (ZETIA) 10 MG tablet Take 10 mg by mouth daily.           fish oil-omega-3 fatty acids 1000 MG capsule Take 1 g by mouth 2 (two) times daily.           OVER THE COUNTER MEDICATION Take 1 capsule by mouth daily as needed. Store brand allergy medication.           amLODipine (NORVASC) 5 MG tablet Take 5 mg by mouth daily.           valsartan (DIOVAN) 320 MG tablet Take 320 mg by mouth daily.           nitroGLYCERIN (NITROSTAT) 0.4 MG SL tablet Place 0.4 mg under the tongue every 5 (five) minutes as needed. Chest pain           traMADol (ULTRAM) 50 MG tablet Take 1 tablet (50 mg total) by mouth every 6 (six) hours as needed.            Verbal and written Discharge instructions given to the patient. Wound care per Discharge AVS Follow-up Information    Follow up with Sherren Kerns, MD. In 1 month. (office will arrange - sent)    Contact information:   [address] [place] [address] [phone]          Signed: Clinton Gallant [facility] 06/13/2012, 7:49 AM

## 2012-06-13 NOTE — Progress Notes (Signed)
Patient d/c'd home per MD order.  Patient's assessment WNL and VVS; BPP palpable.  Patient ambulated in hallway with no assist on room air an o2 stats remained in the 90's with no c/o SOB. Patient and family given d/c instructions/prescriptions and all questions answered. Patient d/c'd with family per wheelchair with NS/MT.

## 2012-07-10 ENCOUNTER — Encounter: Payer: Self-pay | Admitting: Vascular Surgery

## 2012-07-11 ENCOUNTER — Ambulatory Visit
Admission: RE | Admit: 2012-07-11 | Discharge: 2012-07-11 | Disposition: A | Discharge status: Home / Self Care | Payer: BC Managed Care – PPO | Source: Ambulatory Visit | Attending: Vascular Surgery | Admitting: Vascular Surgery

## 2012-07-11 ENCOUNTER — Encounter: Payer: Self-pay | Admitting: Vascular Surgery

## 2012-07-11 ENCOUNTER — Ambulatory Visit (INDEPENDENT_AMBULATORY_CARE_PROVIDER_SITE_OTHER): Payer: BC Managed Care – PPO | Admitting: Vascular Surgery

## 2012-07-11 VITALS — BP 124/74 | HR 52 | Temp 97.5°F | Resp 16 | Ht 70.0 in | Wt 183.0 lb

## 2012-07-11 DIAGNOSIS — Z48812 Encounter for surgical aftercare following surgery on the circulatory system: Secondary | ICD-10-CM

## 2012-07-11 DIAGNOSIS — I714 Abdominal aortic aneurysm, without rupture: Secondary | ICD-10-CM

## 2012-07-11 MED ORDER — IOHEXOL 350 MG/ML SOLN
80.0000 mL | Freq: Once | INTRAVENOUS | Status: AC | PRN
  Administered 2012-07-11: 80 mL via INTRAVENOUS

## 2012-07-11 NOTE — Progress Notes (Signed)
Patient is a [age]-year-old male proximally one month status post placement of a Gore Excluder stent graft with bilateral femoral endarterectomies. He states his incisions are healing. The soreness is improving. He still has a patchy area of numbness in his left thigh. He has had no drainage from the incisions. He has no abdominal or back pain. He wishes to return to work. Of note he also had a left renal artery stenosis found at the time of his stent graft repair. We are considering intervention for this.  Review of systems: He denies shortness of breath. He denies chest pain. He denies claudication symptoms.  Physical exam: Filed Vitals:   07/11/12 1337  BP: 124/74  Pulse: 52  Temp: 97.5 F (36.4 C)  TempSrc: Oral  Resp: 16  Height: 5\' 10"  (1.778 m)  Weight: 183 lb (83.008 kg)  SpO2: 96%   Abdomen: Soft nontender nondistended no pulsatile mass, bilateral groin incisions healing well 2+ femoral pulses  Extremities: 2+ dorsalis pedis pulses bilaterally  Data: The patient has CT Angio the abdomen and pelvis today. This again shows a left renal artery stenosis. He has had some decrease in her left renal mass with cortical thinning. The stent graft is in good position with no evidence of migration. The aneurysm diameter was 5.7 cm. There is no endoleak. Left and right external/internal and common iliac arteries are widely patent.  Assessment: Doing well status post aneurysm stent graft repair. #2 left renal artery stenosis asymptomatic with well-controlled blood pressure but evidence of decreasing renal mass. I believe the patient needs to wait a few more weeks to allow his groin incisions to heal before we consider intervention for his renal artery. However the patient wishes to defer this and that left her duct hunting season.  Plan: Left renal angiogram possible angioplasty and stenting the first Friday in February 2014           Repeat CT angiogram abdomen and pelvis to review his aneurysm  stent graft in 5 months.  Fabienne Bruns, MD [facility] [place] Office: [phone] Pager: [phone]

## 2012-07-11 NOTE — Addendum Note (Signed)
Addended by: Sharee Pimple on: 07/11/2012 03:02 PM   Modules accepted: Orders

## 2012-07-12 ENCOUNTER — Other Ambulatory Visit: Payer: Self-pay | Admitting: *Deleted

## 2012-07-12 ENCOUNTER — Encounter: Payer: Self-pay | Admitting: *Deleted

## 2012-07-23 ENCOUNTER — Other Ambulatory Visit: Payer: Self-pay | Admitting: *Deleted

## 2012-07-23 MED ORDER — VALSARTAN 320 MG PO TABS: 320.0000 mg | ORAL_TABLET | Freq: Every day | ORAL | Status: DC

## 2012-07-23 NOTE — Telephone Encounter (Signed)
Fax Received. Refill Completed. Amarilys Lyles Chowoe (R.M.A)   

## 2012-07-24 ENCOUNTER — Other Ambulatory Visit: Payer: Self-pay | Admitting: *Deleted

## 2012-07-24 NOTE — Telephone Encounter (Signed)
Opened in Error.

## 2012-08-01 ENCOUNTER — Encounter (HOSPITAL_COMMUNITY): Payer: Self-pay | Admitting: Pharmacy Technician

## 2012-08-09 ENCOUNTER — Encounter (HOSPITAL_COMMUNITY)
Admission: RE | Disposition: A | Discharge status: Home / Self Care | Payer: Self-pay | Source: Ambulatory Visit | Attending: Vascular Surgery

## 2012-08-09 ENCOUNTER — Ambulatory Visit (HOSPITAL_COMMUNITY)
Admission: RE | Admit: 2012-08-09 | Discharge: 2012-08-09 | Disposition: A | Discharge status: Home / Self Care | Payer: BC Managed Care – PPO | Source: Ambulatory Visit | Attending: Vascular Surgery | Admitting: Vascular Surgery

## 2012-08-09 ENCOUNTER — Other Ambulatory Visit: Payer: Self-pay | Admitting: *Deleted

## 2012-08-09 ENCOUNTER — Telehealth: Payer: Self-pay | Admitting: Vascular Surgery

## 2012-08-09 DIAGNOSIS — I701 Atherosclerosis of renal artery: Secondary | ICD-10-CM | POA: Insufficient documentation

## 2012-08-09 DIAGNOSIS — I714 Abdominal aortic aneurysm, without rupture: Secondary | ICD-10-CM

## 2012-08-09 DIAGNOSIS — Z48812 Encounter for surgical aftercare following surgery on the circulatory system: Secondary | ICD-10-CM

## 2012-08-09 HISTORY — PX: PERCUTANEOUS STENT INTERVENTION: SHX5500

## 2012-08-09 HISTORY — PX: RENAL ANGIOGRAM: SHX5509

## 2012-08-09 LAB — POCT I-STAT, CHEM 8
Calcium, Ion: 1.26 mmol/L (ref 1.13–1.30)
Creatinine, Ser: 1.2 mg/dL (ref 0.50–1.35)
Glucose, Bld: 107 mg/dL — ABNORMAL HIGH (ref 70–99)
Hemoglobin: 15.3 g/dL (ref 13.0–17.0)
Potassium: 4 mEq/L (ref 3.5–5.1)
TCO2: 24 mmol/L (ref 0–100)

## 2012-08-09 LAB — POCT ACTIVATED CLOTTING TIME
Activated Clotting Time: 219 seconds
Activated Clotting Time: 171 seconds

## 2012-08-09 SURGERY — RENAL ANGIOGRAM
Anesthesia: LOCAL

## 2012-08-09 MED ORDER — CLOPIDOGREL BISULFATE 75 MG PO TABS: 75.0000 mg | ORAL_TABLET | Freq: Every day | ORAL | Status: DC

## 2012-08-09 MED ORDER — SODIUM CHLORIDE 0.9 % IV SOLN
INTRAVENOUS | Status: DC
  Administered 2012-08-09: 08:00:00 via INTRAVENOUS

## 2012-08-09 MED ORDER — ACETAMINOPHEN 325 MG PO TABS: 325.0000 mg | ORAL_TABLET | ORAL | Status: DC | PRN

## 2012-08-09 MED ORDER — SODIUM CHLORIDE 0.45 % IV SOLN
INTRAVENOUS | Status: DC
  Administered 2012-08-09: 10:00:00 via INTRAVENOUS

## 2012-08-09 MED ORDER — LIDOCAINE HCL (PF) 1 % IJ SOLN
INTRAMUSCULAR | Status: AC
  Filled 2012-08-09: qty 30

## 2012-08-09 MED ORDER — LABETALOL HCL 5 MG/ML IV SOLN: 10.0000 mg | INTRAVENOUS | Status: DC | PRN

## 2012-08-09 MED ORDER — FENTANYL CITRATE 0.05 MG/ML IJ SOLN
INTRAMUSCULAR | Status: AC
  Filled 2012-08-09: qty 2

## 2012-08-09 MED ORDER — ONDANSETRON HCL 4 MG/2ML IJ SOLN: 4.0000 mg | Freq: Four times a day (QID) | INTRAMUSCULAR | Status: DC | PRN

## 2012-08-09 MED ORDER — CLOPIDOGREL BISULFATE 300 MG PO TABS: 300.0000 mg | ORAL_TABLET | Freq: Once | ORAL | Status: DC

## 2012-08-09 MED ORDER — HYDRALAZINE HCL 20 MG/ML IJ SOLN: 10.0000 mg | INTRAMUSCULAR | Status: DC | PRN

## 2012-08-09 MED ORDER — MIDAZOLAM HCL 2 MG/2ML IJ SOLN
INTRAMUSCULAR | Status: AC
  Filled 2012-08-09: qty 2

## 2012-08-09 MED ORDER — DOCUSATE SODIUM 100 MG PO CAPS: 100.0000 mg | ORAL_CAPSULE | Freq: Every day | ORAL | Status: DC

## 2012-08-09 MED ORDER — ACETAMINOPHEN 325 MG RE SUPP: 325.0000 mg | RECTAL | Status: DC | PRN

## 2012-08-09 MED ORDER — HEPARIN SODIUM (PORCINE) 1000 UNIT/ML IJ SOLN
INTRAMUSCULAR | Status: AC
  Filled 2012-08-09: qty 1

## 2012-08-09 MED ORDER — METOPROLOL TARTRATE 1 MG/ML IV SOLN: 2.0000 mg | INTRAVENOUS | Status: DC | PRN

## 2012-08-09 NOTE — Op Note (Signed)
Procedure: #1 left renal angiogram #2 left renal stent  Preoperative diagnosis: Left renal artery stenosis  Postoperative diagnosis: Same  Anesthesia: Local with IV sedation  Operative findings: 6.5 x 1.5 balloon expandable stent left renal artery origin  Operative details: After obtaining informed consent, the patient was taken to the PV lab. The patient was placed in supine position the Angio table. Both groins were prepped and draped in the usual sterile fashion. Local anesthesia was infiltrated over the right common femoral artery. An introducer needle was used to cannulate the right common femoral artery and an 035 versacore wire was threaded into the abdominal aorta under fluoroscopic guidance. A 6 French sheath was then placed over the guidewire into the right common femoral artery. A pigtail catheter was then placed over the guidewire into the abdominal aorta and abdominal aortogram obtained to determine the location of the left renal artery. There was a 90% stenosis of the left renal artery with post stenotic dilatation. The distal renal artery measures 7 mm. An oblique view was also performed. The right renal artery was patent. The proximal portion of an endovascular aortic stent graft was visualized and there was no evidence of type I proximal endoleak. Next the patient was given 5000 units of intravenous heparin. The pigtail catheter was exchanged over a guidewire for a 6 [place] IMA guide cath. An 014 Spartacore wire was then advanced up through the guide sheath and easily across the left renal artery stenosis. This was advanced deep into the renal hilum. A 6.5 x 1.5 balloon expandable stent was then brought up and positioned at the left renal origin. This was then deployed to 11 atmospheres for 30 seconds. Multiple angiographic pictures were performed to make sure that the stent was in good position. The stent delivery system was pulled back and a completion arteriogram obtained. This showed  the stent was in good position and was widely patent and fully deployed. There was some poststenotic dilatation of the left renal artery which was approximately 7 mm. There was good wall apposition of the stent over most of its portion. There was no evidence of dissection or distal embolization. The guide sheath was pulled back over guidewire.  6 French sheath was thoroughly flushed with heparinized saline and left in place to be pulled in the holding area after the ACT is less than 175. The patient tolerated the procedure well and there were no complications.  Fabienne Bruns, MD [facility] [place] Office: [phone] Pager: [phone]

## 2012-08-09 NOTE — Telephone Encounter (Addendum)
Message copied by Shari Prows on Fri Aug 09, 2012  1:42 PM ------      Message from: Sherren Kerns      Created: Fri Aug 09, 2012  9:42 AM       Left renal angio and stent      He needs renal duplex in office and CT angio abdomen pelvis for his aortic stent graft in 5 months.Leonette Most  I scheduled an appt for the pt for 12/19/12 at 11:45am to see Dr.Fields. Once he is discharged from the hospital I will also schedule the cta and mail the pt the appt information.awt

## 2012-08-29 ENCOUNTER — Other Ambulatory Visit: Payer: Self-pay | Admitting: *Deleted

## 2012-08-29 MED ORDER — AMLODIPINE BESYLATE 5 MG PO TABS: 5.0000 mg | ORAL_TABLET | Freq: Every day | ORAL | Status: DC

## 2012-08-29 NOTE — Telephone Encounter (Signed)
Fax Received. Refill Completed. Angline Schweigert Chowoe (R.M.A)   

## 2012-10-18 ENCOUNTER — Other Ambulatory Visit: Payer: Self-pay | Admitting: *Deleted

## 2012-10-18 DIAGNOSIS — R0989 Other specified symptoms and signs involving the circulatory and respiratory systems: Secondary | ICD-10-CM

## 2012-10-24 ENCOUNTER — Other Ambulatory Visit (INDEPENDENT_AMBULATORY_CARE_PROVIDER_SITE_OTHER): Payer: BC Managed Care – PPO | Admitting: *Deleted

## 2012-10-24 DIAGNOSIS — R0989 Other specified symptoms and signs involving the circulatory and respiratory systems: Secondary | ICD-10-CM

## 2012-11-05 ENCOUNTER — Telehealth: Payer: Self-pay | Admitting: Cardiovascular Disease

## 2012-11-05 MED ORDER — EZETIMIBE 10 MG PO TABS: 10.0000 mg | ORAL_TABLET | Freq: Every day | ORAL | Status: DC

## 2012-11-05 NOTE — Telephone Encounter (Signed)
[facility] requesting refill of zetia, sent fax to wrong number, they called here with info

## 2012-11-22 ENCOUNTER — Encounter: Payer: Self-pay | Admitting: Cardiovascular Disease

## 2012-11-22 ENCOUNTER — Ambulatory Visit (INDEPENDENT_AMBULATORY_CARE_PROVIDER_SITE_OTHER): Payer: BC Managed Care – PPO | Admitting: Cardiovascular Disease

## 2012-11-22 ENCOUNTER — Other Ambulatory Visit (INDEPENDENT_AMBULATORY_CARE_PROVIDER_SITE_OTHER): Payer: BC Managed Care – PPO

## 2012-11-22 ENCOUNTER — Other Ambulatory Visit: Payer: Self-pay | Admitting: *Deleted

## 2012-11-22 VITALS — BP 108/74 | HR 59 | Ht 70.0 in | Wt 197.0 lb

## 2012-11-22 DIAGNOSIS — E785 Hyperlipidemia, unspecified: Secondary | ICD-10-CM

## 2012-11-22 DIAGNOSIS — E7800 Pure hypercholesterolemia, unspecified: Secondary | ICD-10-CM

## 2012-11-22 LAB — LIPID PANEL
Cholesterol: 106 mg/dL (ref 0–200)
HDL: 27 mg/dL — ABNORMAL LOW (ref >39.00)
Triglycerides: 93 mg/dL (ref 0.0–149.0)
VLDL: 18.6 mg/dL (ref 0.0–40.0)

## 2012-11-22 LAB — BASIC METABOLIC PANEL
BUN: 17 mg/dL (ref 6–23)
CO2: 25 mEq/L (ref 19–32)
Calcium: 8.7 mg/dL (ref 8.4–10.5)
Chloride: 107 mEq/L (ref 96–112)
Creatinine, Ser: 1.3 mg/dL (ref 0.4–1.5)
GFR: 62.47 mL/min (ref >60.00)
Glucose, Bld: 99 mg/dL (ref 70–99)
Potassium: 4.2 mEq/L (ref 3.5–5.1)
Sodium: 137 mEq/L (ref 135–145)

## 2012-11-22 LAB — HEPATIC FUNCTION PANEL
ALT: 24 U/L (ref 0–53)
AST: 25 U/L (ref 0–37)
Total Protein: 7.2 g/dL (ref 6.0–8.3)

## 2012-11-22 MED ORDER — PROPRANOLOL HCL 10 MG PO TABS: 10.0000 mg | ORAL_TABLET | Freq: Four times a day (QID) | ORAL | Status: DC

## 2012-11-22 NOTE — Patient Instructions (Addendum)
Your physician recommends that you return for a FASTING lipid profile: 6 months ... Come fasting 8 hours but drink lots of water  Your physician wants you to follow-up in: 6 months You will receive a reminder letter in the mail two months in advance. If you don't receive a letter, please call our office to schedule the follow-up appointment.   Your physician has recommended you make the following change in your medication:  Start Propranolol 10 mg as needed for palpitations you can take one every 30 minutes up to 4 doses.  REDUCE HIGH SODIUM FOODS LIKE CANNED SOUP, GRAVY, SAUCES, READY PREPARED FOODS LIKE FROZEN FOODS; LEAN CUISINE, LASAGNA. BACON, SAUSAGE, LUNCH MEAT, FAST FOODS.[name] Kitchen  DASH Diet The DASH diet stands for "Dietary Approaches to Stop Hypertension." It is a healthy eating plan that has been shown to reduce high blood pressure (hypertension) in as little as 14 days, while also possibly providing other significant health benefits. These other health benefits include reducing the risk of breast cancer after menopause and reducing the risk of type 2 diabetes, heart disease, colon cancer, and stroke. Health benefits also include weight loss and slowing kidney failure in patients with chronic kidney disease.  DIET GUIDELINES  Limit salt (sodium). Your diet should contain less than 1500 mg of sodium daily.  Limit refined or processed carbohydrates. Your diet should include mostly whole grains. Desserts and added sugars should be used sparingly.  Include small amounts of heart-healthy fats. These types of fats include nuts, oils, and tub margarine. Limit saturated and trans fats. These fats have been shown to be harmful in the body. CHOOSING FOODS  The following food groups are based on a 2000 calorie diet. See your Registered Dietitian for individual calorie needs. Grains and Grain Products (6 to 8 servings daily)  Eat More Often: Whole-wheat bread, brown rice, whole-grain or wheat pasta,  quinoa, popcorn without added fat or salt (air popped).  Eat Less Often: White bread, white pasta, white rice, cornbread. Vegetables (4 to 5 servings daily)  Eat More Often: Fresh, frozen, and canned vegetables. Vegetables may be raw, steamed, roasted, or grilled with a minimal amount of fat.  Eat Less Often/Avoid: Creamed or fried vegetables. Vegetables in a cheese sauce. Fruit (4 to 5 servings daily)  Eat More Often: All fresh, canned (in natural juice), or frozen fruits. Dried fruits without added sugar. One hundred percent fruit juice ( cup [237 mL] daily).  Eat Less Often: Dried fruits with added sugar. Canned fruit in light or heavy syrup. [employer], Fish, and Poultry (2 servings or less daily. One serving is 3 to 4 oz [85-114 g]).  Eat More Often: Ninety percent or leaner ground beef, tenderloin, sirloin. Round cuts of beef, chicken breast, [place] breast. All fish. Grill, bake, or broil your meat. Nothing should be fried.  Eat Less Often/Avoid: Fatty cuts of meat, [place], or chicken leg, thigh, or wing. Fried cuts of meat or fish. Dairy (2 to 3 servings)  Eat More Often: Low-fat or fat-free milk, low-fat plain or light yogurt, reduced-fat or part-skim cheese.  Eat Less Often/Avoid: Milk (whole, 2%).Whole milk yogurt. Full-fat cheeses. Nuts, Seeds, and Legumes (4 to 5 servings per week)  Eat More Often: All without added salt.  Eat Less Often/Avoid: Salted nuts and seeds, canned beans with added salt. Fats and Sweets (limited)  Eat More Often: Vegetable oils, tub margarines without trans fats, sugar-free gelatin. Mayonnaise and salad dressings.  Eat Less Often/Avoid: Coconut oils, palm oils, butter,  stick margarine, cream, half and half, cookies, candy, pie. FOR MORE INFORMATION The Dash Diet Eating Plan: www.dashdiet.org Document Released: 07/27/2011 Document Revised: 10/30/2011 Document Reviewed: 07/27/2011 [facility] Patient Information 2013 [place], [place].

## 2012-11-22 NOTE — Assessment & Plan Note (Signed)
And he seems to be doing okay. He's having some occasional episodes of chest tightness that correspond to elevated blood pressure. I suspect that he is eating a little bit more salt limitation. He found that he could relieve these episodes but taking the Toprol. We'll prescribe propranolol 10 mg to be taken 4 times a day as needed for these episodes of hypertension and fast heart rate.  He had a Myoview study last year which was read as low risk. I don't think he has recurrent coronary artery disease at this time.  I'll see him again in 6 months. We'll check fasting labs and EKG at that time.

## 2012-11-22 NOTE — Assessment & Plan Note (Signed)
We'll check fasting lipids, liver enzymes, and basic metabolic profile at his next visit.

## 2012-11-22 NOTE — Progress Notes (Signed)
Fasting labs ordered

## 2012-11-22 NOTE — Progress Notes (Signed)
Tom [name] Date of Birth  1950/08/25 [facility] [address]    [address] [place], [place]  [address] [phone]  Fax  [phone]  Problem list: 1. Coronary artery disease-status post CABG. February 2007. He had a LIMA  to the LAD and a SVG to the right coronary artery. 2. Hypertension 3. Hyperlipidemia 4. Renal insufficiency 5. Peripheral Vascular disease:  S/p AAA stent graft repair ( Oct. 2013)  s/p renal artery stenting  ( Dec. 2013)  History of Present Illness:  62 y.o. year-old gentleman with a history of coronary disease. He status post coronary artery bypass grafting in February 2007. He had a left internal mammary artery to the LAD and a saphenous vein graft to the right coronary artery. He also has a history of hypercholesterolemia, hypertension, and ongoing cigarette smoking.  He's not had any episodes of chest pain, shortness breath, syncope, or presyncope.  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 He does not do any regular aerobic exercise.  He's not had any recurrent episodes of chest pain. He tries to watch his salt intake.  He still smokes about a half a pack of cigarettes a day but has cut back from 2 packs a day.  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  November 22, 2012:  He has had an AAA stent graft repair and renal artery stenting since I last saw him.  He has had several episodes of chest pressure / tightness.  He found his BP and Hr  to be elevated.  He took and extra Toprol and ASA and felt better within an hour.  Current Outpatient Prescriptions on File Prior to Visit  Medication Sig Dispense Refill  . alprazolam (XANAX) 2 MG tablet Take 2 mg by mouth at bedtime.      [name] Kitchen amLODipine (NORVASC) 5 MG tablet Take 1 tablet (5 mg total) by mouth daily.  30 tablet  6  . aspirin 325 MG tablet Take 325 mg by mouth daily.      [name] Kitchen atorvastatin (LIPITOR) 40 MG tablet Take 40 mg by mouth daily.        [name] Kitchen ezetimibe  (ZETIA) 10 MG tablet Take 1 tablet (10 mg total) by mouth daily.  30 tablet  3  . metoprolol (TOPROL-XL) 100 MG 24 hr tablet Take 100 mg by mouth daily.       . Misc Natural Products (OSTEO BI-FLEX ADV TRIPLE ST) TABS Take 2 tablets by mouth daily.       . nitroGLYCERIN (NITROSTAT) 0.4 MG SL tablet Place 0.4 mg under the tongue every 5 (five) minutes as needed. Chest pain      . OVER THE COUNTER MEDICATION Take 1 capsule by mouth daily as needed. Store brand allergy medication.      . pantoprazole (PROTONIX) 40 MG tablet Take 40 mg by mouth daily.        . valsartan (DIOVAN) 320 MG tablet Take 1 tablet (320 mg total) by mouth daily.  30 tablet  5   No current facility-administered medications on file prior to visit.    Allergies  Allergen Reactions  . Codeine Other (See Comments)    GI upset  . Niacin Hives    Past Medical History  Diagnosis Date  . Anxiety   . Renal insufficiency   . Hypertension   . Hypercholesteremia   . AAA (abdominal aortic aneurysm)   . Cancer 2013  skin cancer,   MOHS surgery  . COPD (chronic obstructive pulmonary disease)   . Myocardial infarction 1997  . Complication of anesthesia     hard to sedate  . Coronary artery disease   . GERD (gastroesophageal reflux disease)     Past Surgical History  Procedure Laterality Date  . Coronary artery bypass graft  09/2005    x2 LIMA to LAD saphenous vein to RCA  . Abdominal aortic aneurysm repair      EVAR    History  Smoking status  . Current Some Day Smoker -- 0.50 packs/day for 44 years  . Types: Cigarettes  Smokeless tobacco  . Never Used    Comment: less than 1 ppd    History  Alcohol Use No    Family History  Problem Relation Age of Onset  . Heart disease Father   . Hypertension Father   . Hyperlipidemia Father   . Diabetes Father   . Heart disease Mother   . Hypertension Mother   . Hyperlipidemia Mother   . AAA (abdominal aortic aneurysm) Maternal Uncle   . Cancer Maternal Uncle      Reviw of Systems:  Reviewed in the HPI.  All other systems are negative.  Physical Exam: BP 108/74  Pulse 59  Ht 5\' 10"  (1.778 m)  Wt 197 lb (89.359 kg)  BMI 28.27 kg/m2 The patient is alert and oriented x 3.  The mood and affect are normal.   Skin: warm and dry.  Color is normal.    HEENT:   the sclera are nonicteric.  The mucous membranes are moist.  The carotids are 2+ without bruits.  There is no thyromegaly.  There is no JVD.    Lungs: clear.  The chest wall is non tender.    Heart: regular rate with a normal S1 and S2.  There are no murmurs, gallops, or rubs. The PMI is not displaced.     Abdomen: good bowel sounds.  There is no guarding or rebound.    Extremities:  no clubbing, cyanosis, or edema.  The legs are without rashes.  The distal pulses are intact.   Neuro:  Cranial nerves II - XII are intact.  Motor and sensory functions are intact.    The gait is normal.  ECG:    Assessment / Plan:

## 2012-12-18 ENCOUNTER — Encounter: Payer: Self-pay | Admitting: Vascular Surgery

## 2012-12-19 ENCOUNTER — Ambulatory Visit
Admission: RE | Admit: 2012-12-19 | Discharge: 2012-12-19 | Disposition: A | Discharge status: Home / Self Care | Payer: BC Managed Care – PPO | Source: Ambulatory Visit | Attending: Vascular Surgery | Admitting: Vascular Surgery

## 2012-12-19 ENCOUNTER — Ambulatory Visit (INDEPENDENT_AMBULATORY_CARE_PROVIDER_SITE_OTHER): Payer: BC Managed Care – PPO | Admitting: Vascular Surgery

## 2012-12-19 ENCOUNTER — Encounter: Payer: Self-pay | Admitting: Vascular Surgery

## 2012-12-19 VITALS — BP 111/71 | HR 63 | Resp 16 | Ht 70.0 in | Wt 198.0 lb

## 2012-12-19 DIAGNOSIS — Z48812 Encounter for surgical aftercare following surgery on the circulatory system: Secondary | ICD-10-CM

## 2012-12-19 DIAGNOSIS — I714 Abdominal aortic aneurysm, without rupture: Secondary | ICD-10-CM

## 2012-12-19 DIAGNOSIS — I701 Atherosclerosis of renal artery: Secondary | ICD-10-CM | POA: Insufficient documentation

## 2012-12-19 MED ORDER — IOHEXOL 350 MG/ML SOLN
80.0000 mL | Freq: Once | INTRAVENOUS | Status: AC | PRN
  Administered 2012-12-19: 80 mL via INTRAVENOUS

## 2012-12-19 NOTE — Addendum Note (Signed)
Addended by: Adria Dill L on: 12/19/2012 12:51 PM   Modules accepted: Orders

## 2012-12-19 NOTE — Progress Notes (Signed)
Patient is a 62 year old male approximately 5 months status post placement of a Gore Excluder stent graft with bilateral femoral endarterectomies in November 2013. The soreness is improving. He still has a patchy area of numbness in his left thigh. He has had no drainage from the incisions. He has no abdominal or back pain. Of note he also had a left renal artery stenosis found at the time of his stent graft repair. He underwent left renal artery stenting 08/09/2012.  He remains on 3 medications for blood pressure control. He was started on Plavix after his renal artery stenting. He is going to continue this for one more month and then changed back to his aspirin.  Current Outpatient Prescriptions on File Prior to Visit  Medication Sig Dispense Refill  . alprazolam (XANAX) 2 MG tablet Take 2 mg by mouth at bedtime.      [name] Kitchen amLODipine (NORVASC) 5 MG tablet Take 1 tablet (5 mg total) by mouth daily.  30 tablet  6  . aspirin 325 MG tablet Take 325 mg by mouth daily.      [name] Kitchen atorvastatin (LIPITOR) 40 MG tablet Take 40 mg by mouth daily.        [name] Kitchen ezetimibe (ZETIA) 10 MG tablet Take 1 tablet (10 mg total) by mouth daily.  30 tablet  3  . metoprolol (TOPROL-XL) 100 MG 24 hr tablet Take 100 mg by mouth daily.       . Misc Natural Products (OSTEO BI-FLEX ADV TRIPLE ST) TABS Take 2 tablets by mouth daily.       . nitroGLYCERIN (NITROSTAT) 0.4 MG SL tablet Place 0.4 mg under the tongue every 5 (five) minutes as needed. Chest pain      . OVER THE COUNTER MEDICATION Take 1 capsule by mouth daily as needed. Store brand allergy medication.      . pantoprazole (PROTONIX) 40 MG tablet Take 40 mg by mouth daily.        . propranolol (INDERAL) 10 MG tablet Take 1 tablet (10 mg total) by mouth 4 (four) times daily. As needed for palpitations  30 tablet  11  . valsartan (DIOVAN) 320 MG tablet Take 1 tablet (320 mg total) by mouth daily.  30 tablet  5   No current facility-administered medications on file prior to visit.     Review of systems: He denies shortness of breath. He denies chest pain. He denies claudication symptoms.  Physical exam:  Filed Vitals:   12/19/12 1151  BP: 111/71  Pulse: 63  Resp: 16  Height: 5\' 10"  (1.778 m)  Weight: 198 lb (89.812 kg)  SpO2: 94%   Extremities: Well-healed groin incisions, 2+ DP pulses bilaterally  Abdomen: Soft nontender nondistended no mass  Data: CT angiogram of the abdomen and pelvis is reviewed today. He has no evidence of endoleak. Aneurysm has decreased in size from 5.7 5.4 cm. Left renal stent is patent.  Assessment: Doing well status post Gore Excluder stent graft repair of abdominal aortic aneurysm as well as left renal artery stenting  Plan: Followup in 6 months with repeat CT Angio at that time. If his stent graft in renal stent continued to do well we will consider switching ultrasound after that. Patient will stop his Plavix after his current 30 a prescription.  Fabienne Bruns, MD [facility] Office: [phone] Pager: [phone]

## 2013-01-23 ENCOUNTER — Other Ambulatory Visit: Payer: Self-pay | Admitting: *Deleted

## 2013-01-23 MED ORDER — VALSARTAN 320 MG PO TABS: 320.0000 mg | ORAL_TABLET | Freq: Every day | ORAL | Status: DC

## 2013-01-23 NOTE — Telephone Encounter (Signed)
Fax Received. Refill Completed. Tom [name] (R.M.A)   

## 2013-02-26 ENCOUNTER — Encounter: Payer: Self-pay | Admitting: *Deleted

## 2013-02-26 DIAGNOSIS — I10 Essential (primary) hypertension: Secondary | ICD-10-CM | POA: Insufficient documentation

## 2013-03-07 ENCOUNTER — Other Ambulatory Visit: Payer: Self-pay | Admitting: Cardiovascular Disease

## 2013-03-20 ENCOUNTER — Other Ambulatory Visit: Payer: Self-pay | Admitting: Cardiovascular Disease

## 2013-04-04 IMAGING — CT CT CTA ABD/PEL W/CM AND/OR W/O CM
1 of 9 series · 10 of 46 positions shown, 16 images · IV contrast (80CC OMNI 350)
Comparison: CT abdomen pelvis - 06/06/2012

CLINICAL DATA: Markeliano Gabet

CT ANGIOGRAPHY ABDOMEN AND PELVIS
TECHNIQUE: Multidetector CT imaging of the abdomen and pelvis was
performed using the standard protocol during bolus administration
of intravenous contrast.  Multiplanar reconstructed images
including MIPs were obtained and reviewed to evaluate the vascular
anatomy.
Contrast: 80mL OMNIPAQUE IOHEXOL 350 MG/ML SOLN

[Series 4: angio · axial · 0.82mm/px · z∈[-362,+8]mm · 10 of 231 slices shown, 16 images]
[im 21/231  soft-tissue]
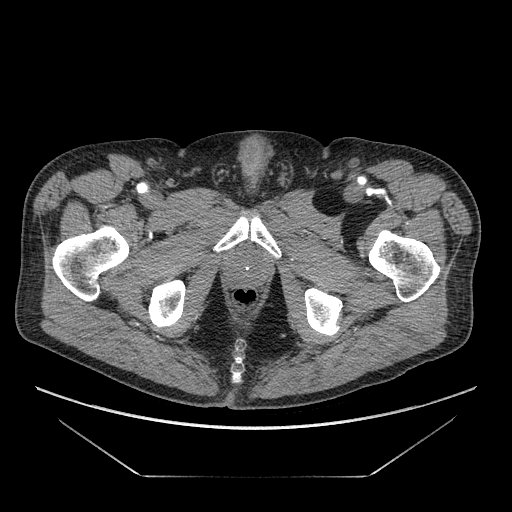
[im 21/231  bone]
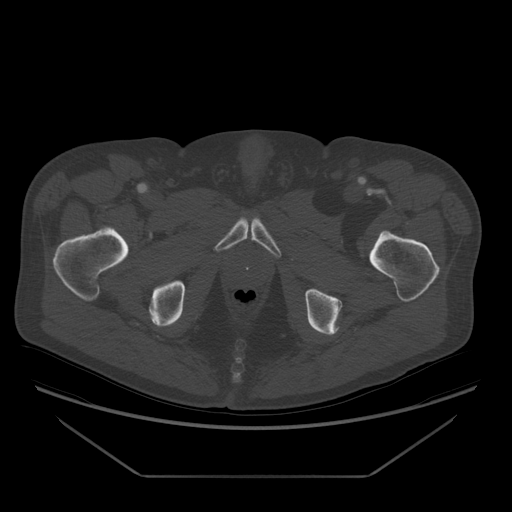
[im 42/231  soft-tissue]
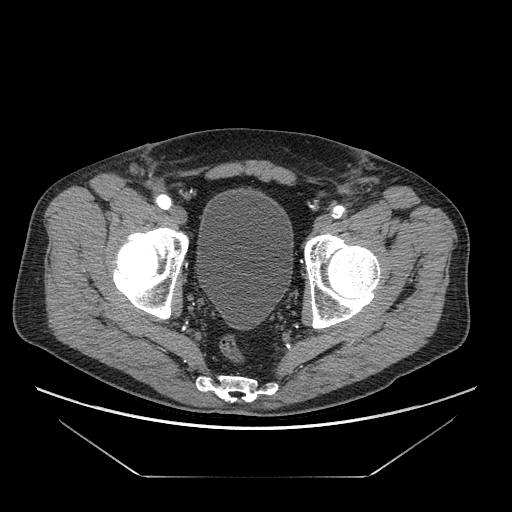
[im 63/231  soft-tissue]
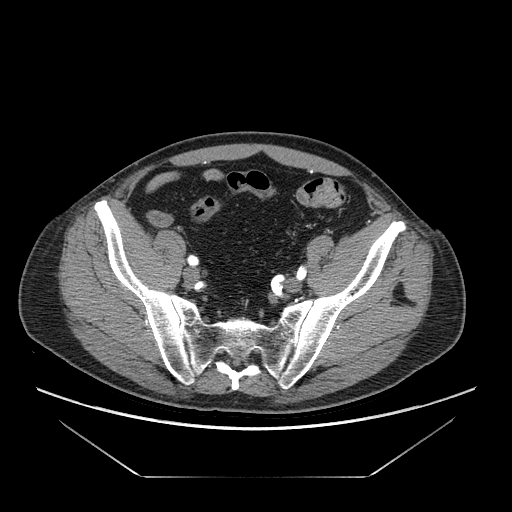
[im 84/231  soft-tissue]
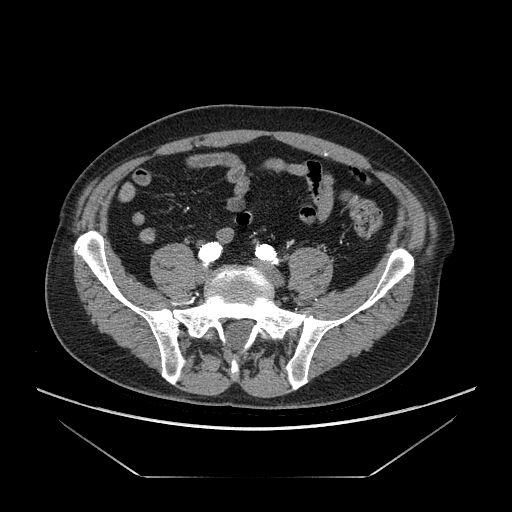
[im 105/231  soft-tissue]
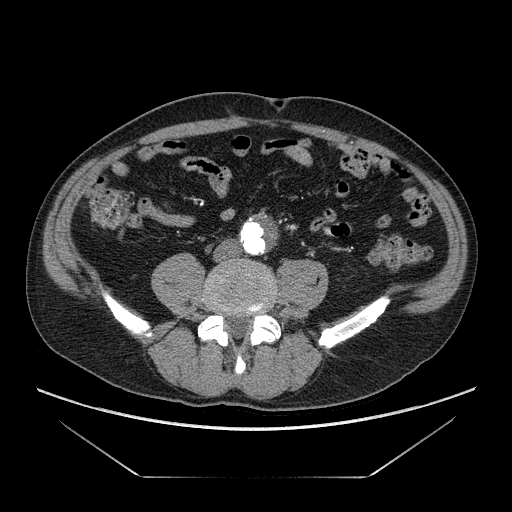
[im 126/231  soft-tissue]
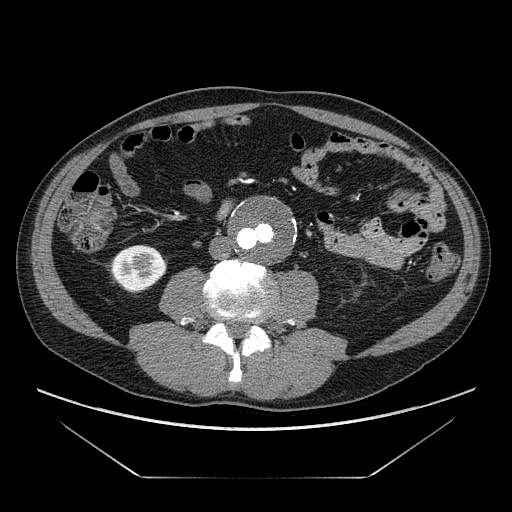
[im 147/231  soft-tissue]
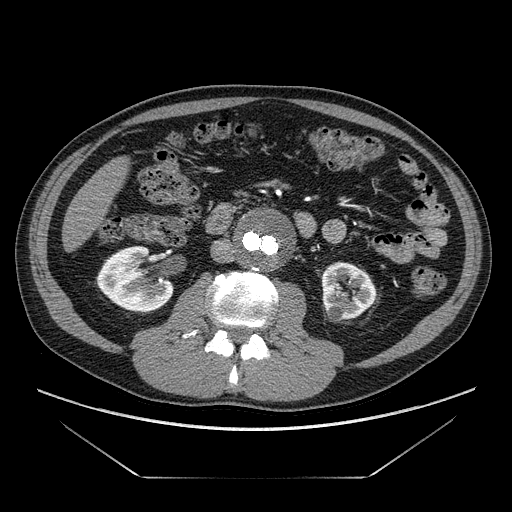
[im 147/231  lung]
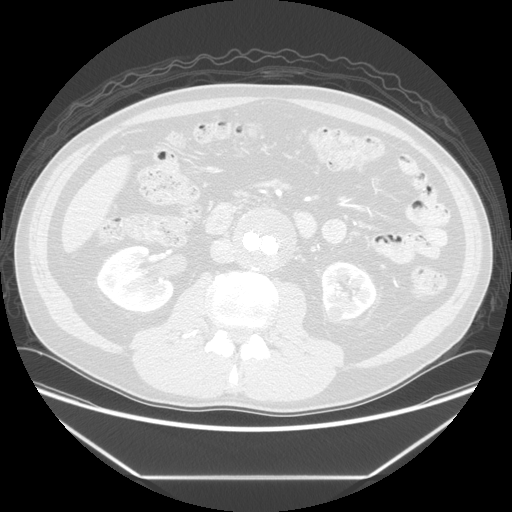
[im 168/231  soft-tissue]
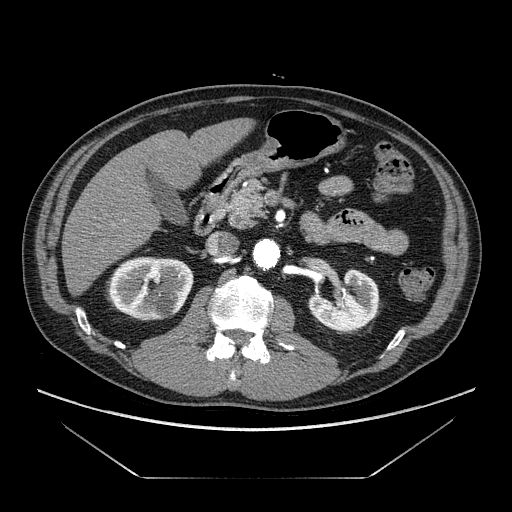
[im 168/231  lung]
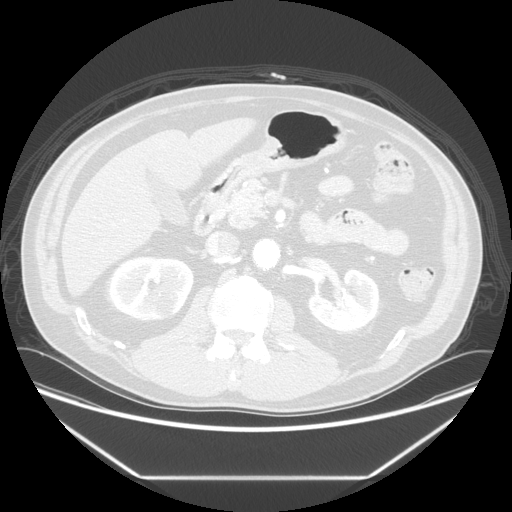
[im 189/231  soft-tissue]
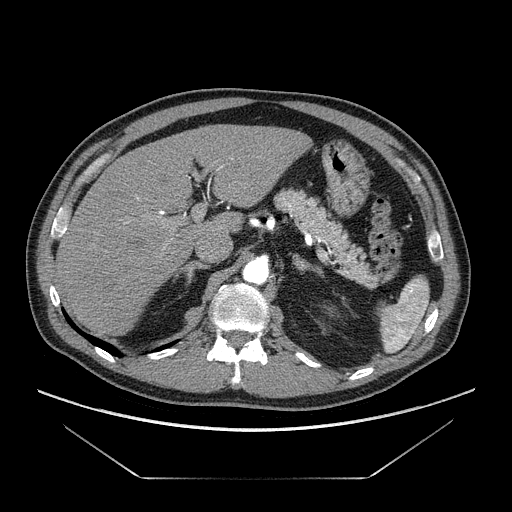
[im 189/231  lung]
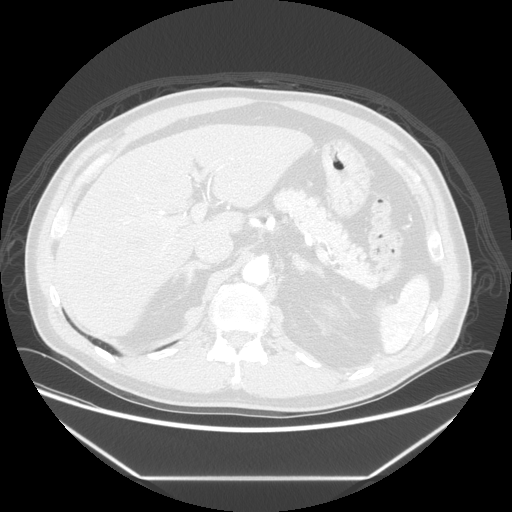
[im 189/231  bone]
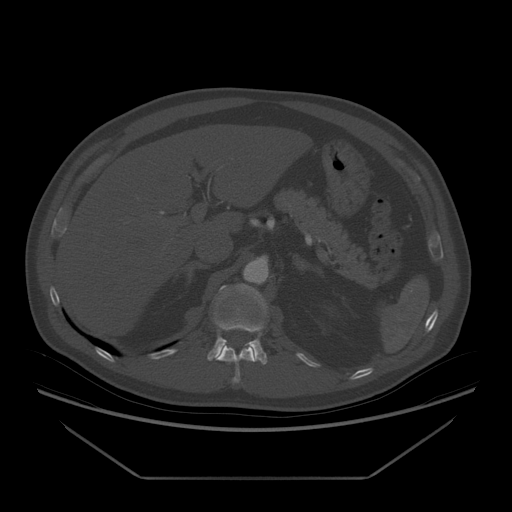
[im 210/231  soft-tissue]
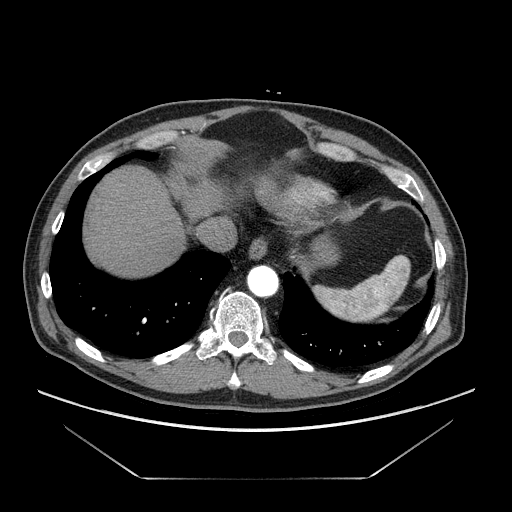
[im 210/231  lung]
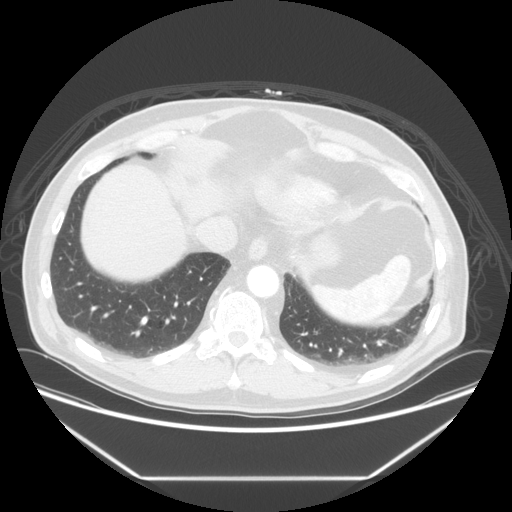

[10 of 46 positions shown; findings below may reference images not displayed]

Chest Findings:

Abdominal aorta: Post endovascular repair of previously identified
infrarenal abdominal aortic aneurysm.  The proximal aspects of the
stent graft is well apposed against the walls of the pararenal
abdominal aorta.  Both distal limbs of the stent graft appear well
apposed against the walls of a respective common iliac arteries.
There is no definitive opacification of the excluded native
abdominal aorta to suggest the presence of an Endo leak.  The now
excluded native abdominal aortic aneurysm sac is grossly unchanged
measuring approximately 5.4 x 5.7 cm in greatest oblique axial
dimension (image 82, series four) previously, 5.5 x 5.7 cm.  No
periaortic stranding.

Celiac artery:  Widely patent; conventional branching pattern.

SMA:  Widely patent; conventional branching pattern.

Right renal artery:  Solitary with early proximal bifurcation;
widely patent without hemodynamically significant stenosis.

Left renal artery:  There is grossly unchanged eccentric
atherosclerotic plaque involving the origin of the left renal
artery resulting in approximately 70% luminal narrowing (coronal
image 62, series 601).  This finding is associated with possible
mild downstream poststenotic dilatation with the proximal aspect of
the left renal artery measuring approximately 9 mm in greatest
oblique coronal dimension (image 63, series 601).]   This finding
is also associated with mild asymmetric atrophy and cortical
thinning of the left kidney, with the left kidney measuring
approximately 9.4 cm in greatest oblique coronal dimension (image
71), the right kidney measuring approximately 10.6 cm.

IMA: The IMA remains occluded at its origin with early
reconstitution via a collateral supply from the marginal artery
[place].

Pelvic vasculature:  The distal limbs of the abdominal aortic stent
graft appeared well apposed against the walls of the respective
common iliac arteries.  There is scattered atherosclerotic plaque
within the bilateral external iliac arteries, not resulting in
hemodynamically significant stenosis.

Review of the MIP images confirms the above findings.

-------------------------------------------------------------------

Nonvascular findings:

Evaluation of the abdominal organs is largely limited to the
arterial phase enhancement.

Normal hepatic contour.  Sub centimeter hypoattenuating lesions
within the left lobe of the liver (images 38 and 41) are too small
to fully characterize but may represent hepatic cysts.  No discrete
hyperenhancing hepatic lesions.  Normal gallbladder.  No definite
intra or extrahepatic biliary ductal dilatation.  No ascites.

There is symmetric enhancement and excretion of the bilateral
kidneys.  The unchanged appearance of a nonenhancing approximately
1 cm hyperdense cyst arising from the superior pole left kidney
(images 14, series 2; image 194, series four).  Additional
bilateral renal cysts are unchanged, the largest of which within
the posterior superior aspect of the right kidney measures
approximately 2.6 cm (image 195, series four).  Grossly unchanged
minimal amounts of perinephric stranding, left greater than right.
No urinary obstruction.  No renal stones.  There is mild diffuse
thickening of the crux of the right adrenal gland and lateral limb
of the left adrenal gland without discrete nodule.  Normal
appearance of the pancreas and spleen.

Extensive colonic diverticulosis without evidence of
diverticulitis.  Bowel is otherwise normal in course and caliber
without wall thickening or evidence of obstruction.  Normal
appearance of the appendix.

No retroperitoneal, mesenteric, pelvic or inguinal lymphadenopathy.

Borderline enlarged prostate.  Prostatic calcifications.  No free
fluid in the pelvis.  Small bilateral mesenteric fat containing
inguinal hernias.

No acute or aggressive osseous abnormalities.  Incidental note is
made of an intramuscular lipoma within the left abductor
musculature.
IMPRESSION: 1.  Post endovascular repair of infrarenal abdominal aortic
aneurysm without evidence of complication.  Specifically, no
evidence of Endo leak.  Grossly unchanged size of the now excluded
abdominal aortic aneurysm sac, measuring approximately 5.4 x
cm.
2.  Unchanged high-grade stenosis (approximately 70%) of the origin
of the left renal artery with associated mild asymmetric left-sided
renal atrophy and cortical thinning.

3.  Colonic diverticulosis without evidence of diverticulitis.

## 2013-05-28 ENCOUNTER — Ambulatory Visit (INDEPENDENT_AMBULATORY_CARE_PROVIDER_SITE_OTHER): Payer: BC Managed Care – PPO | Admitting: *Deleted

## 2013-05-28 ENCOUNTER — Encounter: Payer: Self-pay | Admitting: Radiology

## 2013-05-28 ENCOUNTER — Telehealth: Payer: Self-pay | Admitting: Cardiovascular Disease

## 2013-05-28 ENCOUNTER — Encounter (INDEPENDENT_AMBULATORY_CARE_PROVIDER_SITE_OTHER): Payer: BC Managed Care – PPO

## 2013-05-28 VITALS — BP 128/70 | HR 56 | Wt 197.0 lb

## 2013-05-28 DIAGNOSIS — I2510 Atherosclerotic heart disease of native coronary artery without angina pectoris: Secondary | ICD-10-CM

## 2013-05-28 DIAGNOSIS — R002 Palpitations: Secondary | ICD-10-CM

## 2013-05-28 DIAGNOSIS — I10 Essential (primary) hypertension: Secondary | ICD-10-CM

## 2013-05-28 NOTE — Progress Notes (Signed)
Pt has had episodes of fast heart rates lasting up to 4 hours. He has had this prior but now has had these episodes 3 x in 1 week. Pt feels well today, no irregularity.  During episodes pt will take extra propranolol/ toprol/ xanax/ asa. Pt states his hr during is 97-111 bpm. His bp monitor states his HR is irregular. Today's reading is SR brady 56 bpm.  [name] / DOD reviewed, 30 day event monitor ordered.  Pt has f/u app with [name] this month.

## 2013-05-28 NOTE — Progress Notes (Signed)
Patient ID: Tom [name], male   DOB: [birth year], 62 y.o.   MRN: [id number] E Cardio Braemer  30 day monitor applied

## 2013-05-28 NOTE — Telephone Encounter (Signed)
Wife states his bp has been elevated 129/111, 112/101 but she is concerned because his pulse has been high 91-111 and irregular. Pt has been taking meds as prescribed. C/o chest tightness at times of heart irregularity. I requested he come in for EKG. She will call him and will call back with date and time.

## 2013-05-28 NOTE — Telephone Encounter (Signed)
New Problem  Pt is experiencing High BP.[name] Kitchen Pt states that its taking longer to get his blood pressure and high pulse down after taking the mediation. Pt is also experiencing chest tightness and Irregular heart beats . Please call.

## 2013-05-28 NOTE — Telephone Encounter (Signed)
Pt will have ekg today.

## 2013-05-28 NOTE — Patient Instructions (Signed)
Event monitor ordered

## 2013-06-13 ENCOUNTER — Ambulatory Visit (INDEPENDENT_AMBULATORY_CARE_PROVIDER_SITE_OTHER): Payer: BC Managed Care – PPO | Admitting: Cardiovascular Disease

## 2013-06-13 ENCOUNTER — Encounter: Payer: Self-pay | Admitting: Cardiovascular Disease

## 2013-06-13 ENCOUNTER — Other Ambulatory Visit: Payer: BC Managed Care – PPO

## 2013-06-13 VITALS — BP 138/77 | HR 68 | Wt 193.0 lb

## 2013-06-13 DIAGNOSIS — R079 Chest pain, unspecified: Secondary | ICD-10-CM

## 2013-06-13 DIAGNOSIS — E785 Hyperlipidemia, unspecified: Secondary | ICD-10-CM

## 2013-06-13 DIAGNOSIS — I2510 Atherosclerotic heart disease of native coronary artery without angina pectoris: Secondary | ICD-10-CM

## 2013-06-13 LAB — BASIC METABOLIC PANEL
BUN: 15 mg/dL (ref 6–23)
CO2: 26 mEq/L (ref 19–32)
Calcium: 9 mg/dL (ref 8.4–10.5)
Chloride: 106 mEq/L (ref 96–112)
Creatinine, Ser: 1.3 mg/dL (ref 0.4–1.5)
GFR: 59.07 mL/min — ABNORMAL LOW (ref >60.00)
Glucose, Bld: 99 mg/dL (ref 70–99)
Potassium: 3.9 mEq/L (ref 3.5–5.1)
Sodium: 138 mEq/L (ref 135–145)

## 2013-06-13 LAB — LIPID PANEL
Cholesterol: 112 mg/dL (ref 0–200)
HDL: 31.1 mg/dL — ABNORMAL LOW (ref >39.00)
LDL Cholesterol: 66 mg/dL (ref 0–99)
Total CHOL/HDL Ratio: 4
Triglycerides: 77 mg/dL (ref 0.0–149.0)
VLDL: 15.4 mg/dL (ref 0.0–40.0)

## 2013-06-13 LAB — HEPATIC FUNCTION PANEL
ALT: 23 U/L (ref 0–53)
AST: 25 U/L (ref 0–37)
Bilirubin, Direct: 0.1 mg/dL (ref 0.0–0.3)
Total Protein: 7 g/dL (ref 6.0–8.3)

## 2013-06-13 NOTE — Progress Notes (Signed)
Tom [name] Date of Birth  1951/05/04 [facility] [address]    [address] [place], [place]  [address] [phone]  Fax  [phone]  Problem list: 1. Coronary artery disease-status post CABG. February 2007. He had a LIMA  to the LAD and a SVG to the right coronary artery. 2. Hypertension 3. Hyperlipidemia 4. Renal insufficiency 5. Peripheral Vascular disease:  S/p AAA stent graft repair ( Oct. 2013)  s/p renal artery stenting  ( Dec. 2013)  History of Present Illness:  62 y.o. year-old gentleman with a history of coronary disease. He status post coronary artery bypass grafting in February 2007. He had a left internal mammary artery to the LAD and a saphenous vein graft to the right coronary artery. He also has a history of hypercholesterolemia, hypertension, and ongoing cigarette smoking.  He's not had any episodes of chest pain, shortness breath, syncope, or presyncope.  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 He does not do any regular aerobic exercise.  He's not had any recurrent episodes of chest pain. He tries to watch his salt intake.  He still smokes about a half a pack of cigarettes a day but has cut back from 2 packs a day.  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  November 22, 2012:  He has had an AAA stent graft repair and renal artery stenting since I last saw him.  He has had several episodes of chest pressure / tightness.  He found his BP and Hr  to be elevated.  He took and extra Toprol and ASA and felt better within an hour.  Oct. 24, 2014:  Kizer is having CP and palpitations .  Some relief with Inderal but occasionally has to take some NTG.  Symptoms seen to occur more at night - not during the day while he is working.  stil smoking 1/2 ppd.  Current Outpatient Prescriptions on File Prior to Visit  Medication Sig Dispense Refill  . alprazolam (XANAX) 2 MG tablet Take 2 mg by mouth at bedtime.      [name] Kitchen  amLODipine (NORVASC) 5 MG tablet TAKE 1 TABLET (5 MG TOTAL) BY MOUTH DAILY.  30 tablet  5  . aspirin 325 MG tablet Take 325 mg by mouth daily.      [name] Kitchen atorvastatin (LIPITOR) 40 MG tablet Take 40 mg by mouth daily.        . clopidogrel (PLAVIX) 75 MG tablet Take 75 mg by mouth daily.       . metoprolol (TOPROL-XL) 100 MG 24 hr tablet Take 100 mg by mouth daily.       . Misc Natural Products (OSTEO BI-FLEX ADV TRIPLE ST) TABS Take 2 tablets by mouth daily.       . nitroGLYCERIN (NITROSTAT) 0.4 MG SL tablet Place 0.4 mg under the tongue every 5 (five) minutes as needed. Chest pain      . OVER THE COUNTER MEDICATION Take 1 capsule by mouth daily as needed. Store brand allergy medication.      . pantoprazole (PROTONIX) 40 MG tablet Take 40 mg by mouth daily.        . propranolol (INDERAL) 10 MG tablet Take 1 tablet (10 mg total) by mouth 4 (four) times daily. As needed for palpitations  30 tablet  11  . valsartan (DIOVAN) 320 MG tablet Take 1 tablet (320 mg total) by mouth daily.  30 tablet  5  .  ZETIA 10 MG tablet TAKE 1 TABLET (10 MG TOTAL) BY MOUTH DAILY.  30 tablet  3   No current facility-administered medications on file prior to visit.    Allergies  Allergen Reactions  . Codeine Other (See Comments)    GI upset ONLY in the cough syrup   . Niacin Hives    Past Medical History  Diagnosis Date  . Anxiety   . Renal insufficiency   . Hypertension   . Hypercholesteremia   . AAA (abdominal aortic aneurysm)   . Cancer 2013    skin cancer,   MOHS surgery  . COPD (chronic obstructive pulmonary disease)   . Myocardial infarction 1997  . Complication of anesthesia     hard to sedate  . Coronary artery disease   . GERD (gastroesophageal reflux disease)     Past Surgical History  Procedure Laterality Date  . Coronary artery bypass graft  09/2005    x2 LIMA to LAD saphenous vein to RCA  . Abdominal aortic aneurysm repair      EVAR  . Renal angiogram Left Dec. 20, 2013    Left Renal  angiogram and  Left Renal stent    History  Smoking status  . Current Some Day Smoker -- 0.50 packs/day for 44 years  . Types: Cigarettes  Smokeless tobacco  . Never Used    Comment: less than 1 ppd    History  Alcohol Use No    Family History  Problem Relation Age of Onset  . Heart disease Father   . Hypertension Father   . Hyperlipidemia Father   . Diabetes Father   . Heart disease Mother   . Hypertension Mother   . Hyperlipidemia Mother   . AAA (abdominal aortic aneurysm) Maternal Uncle   . Cancer Maternal Uncle     Reviw of Systems:  Reviewed in the HPI.  All other systems are negative.  Physical Exam: BP 138/77  Pulse 68  Wt 193 lb (87.544 kg)  BMI 27.69 kg/m2 The patient is alert and oriented x 3.  The mood and affect are normal.   Skin: warm and dry.  Color is normal.    HEENT:   the sclera are nonicteric.  The mucous membranes are moist.  The carotids are 2+ without bruits.  There is no thyromegaly.  There is no JVD.    Lungs: clear.  The chest wall is non tender.    Heart: regular rate with a normal S1 and S2.  There are no murmurs, gallops, or rubs. The PMI is not displaced.     Abdomen: good bowel sounds.  There is no guarding or rebound.    Extremities:  no clubbing, cyanosis, or edema.  The legs are without rashes.  The distal pulses are intact.   Neuro:  Cranial nerves II - XII are intact.  Motor and sensory functions are intact.    The gait is normal.  ECG:  Assessment / Plan:

## 2013-06-13 NOTE — Patient Instructions (Signed)
Your physician has requested that you have a lexiscan myoview. Please follow instruction sheet, as given.  Your physician wants you to follow-up in: 6 months  You will receive a reminder letter in the mail two months in advance. If you don't receive a letter, please call our office to schedule the follow-up appointment.   REDUCE HIGH SODIUM FOODS LIKE CANNED SOUP, GRAVY, SAUCES, READY PREPARED FOODS LIKE FROZEN FOODS; LEAN CUISINE, LASAGNA. BACON, SAUSAGE, LUNCH MEAT, FAST FOODS, HOT DOGS, CHIPS, PIZZA, CHINESE FOOD, SOY SAUCE, STORE BOUGHT FRIED CHICKEN= [place] FRIED CHICKEN/ BOJANGLES.

## 2013-06-13 NOTE — Assessment & Plan Note (Signed)
Tom [name] has continued to have episodes of chest discomfort as well some palpitations. These palpitations typically occur at night. He says there is associated with elevated blood pressure. At this point I cannot tell whether he is having episodes of angina, episodes of atrial tachycardia-which we have documented on the monitor. Or perhaps he is having intermittent episodes of paroxysmal atrial fibrillation.  We'll schedule him for a stress Myoview study. His Myoview study was normal last year prior to his abdominal aortic aneurysm surgery. We'll have him decrease the salt in his diet. We'll have him continue to wear the monitor to make an is not having episodes of paroxysmal atrial fibrillation.

## 2013-06-25 ENCOUNTER — Encounter: Payer: Self-pay | Admitting: Vascular Surgery

## 2013-06-26 ENCOUNTER — Encounter: Payer: Self-pay | Admitting: Vascular Surgery

## 2013-06-26 ENCOUNTER — Ambulatory Visit
Admission: RE | Admit: 2013-06-26 | Discharge: 2013-06-26 | Disposition: A | Discharge status: Home / Self Care | Payer: BC Managed Care – PPO | Source: Ambulatory Visit | Attending: Vascular Surgery | Admitting: Vascular Surgery

## 2013-06-26 ENCOUNTER — Ambulatory Visit (INDEPENDENT_AMBULATORY_CARE_PROVIDER_SITE_OTHER): Payer: BC Managed Care – PPO | Admitting: Vascular Surgery

## 2013-06-26 VITALS — BP 128/71 | HR 56 | Ht 70.0 in | Wt 194.0 lb

## 2013-06-26 DIAGNOSIS — I701 Atherosclerosis of renal artery: Secondary | ICD-10-CM

## 2013-06-26 DIAGNOSIS — Z48812 Encounter for surgical aftercare following surgery on the circulatory system: Secondary | ICD-10-CM

## 2013-06-26 DIAGNOSIS — I714 Abdominal aortic aneurysm, without rupture, unspecified: Secondary | ICD-10-CM

## 2013-06-26 MED ORDER — IOHEXOL 350 MG/ML SOLN
80.0000 mL | Freq: Once | INTRAVENOUS | Status: AC | PRN
  Administered 2013-06-26: 80 mL via INTRAVENOUS

## 2013-06-26 NOTE — Progress Notes (Signed)
[facility] HISTORY AND PHYSICAL    History of Present Illness:  Patient is a 62 y.o.  male who presents for follow-up evaluation of AAA. He underwent Gore Excluder aneurysm stent graft repair in October 2013. He has also had a left renal artery stent and has known carotid stenosis of 40%. The patient denies new abdominal or back pain.  He denies any symptoms of TIA amaurosis or stroke. He has not required any additions to his blood pressure medications although he is taking extra beta blocker when he has symptoms of his heart racing. The patient's atherosclerotic risk factors remain tobacco abuse hypertension elevated cholesterol.  He has been having some episodes of tachycardia with hypertension and is currently under evaluation by Dr. Elease Hashimoto for this. Otherwise his medical problems are stable and followed by his primary care physician.   Past Medical History  Diagnosis Date  . Anxiety   . Renal insufficiency   . Hypertension   . Hypercholesteremia   . AAA (abdominal aortic aneurysm)   . Cancer 2013    skin cancer,   MOHS surgery  . COPD (chronic obstructive pulmonary disease)   . Myocardial infarction 1997  . Complication of anesthesia     hard to sedate  . Coronary artery disease   . GERD (gastroesophageal reflux disease)      Past Surgical History  Procedure Laterality Date  . Coronary artery bypass graft  09/2005    x2 LIMA to LAD saphenous vein to RCA  . Abdominal aortic aneurysm repair      EVAR  . Renal angiogram Left Dec. 20, 2013    Left Renal angiogram and  Left Renal stent       Review of Systems:  Neurologic: denies symptoms of TIA, amaurosis, or stroke Cardiac:denies shortness of breath or chest pain Pulmonary: denies cough or wheeze Abdomen: denies abdominal pain nausea or vomiting  History   Social History  . Marital Status: Married    Spouse Name: N/A    Number of Children: 1 s  . Years of Education: N/A   Occupational  History  . Not on file.   Social History Main Topics  . Smoking status: Current Every Day Smoker -- 0.50 packs/day for 44 years    Types: Cigarettes  . Smokeless tobacco: Never Used     Comment: less than 1 ppd  . Alcohol Use: No  . Drug Use: No  . Sexual Activity: Not on file   Other Topics Concern  . Not on file   Social History Narrative  . No narrative on file    Allergies  Allergen Reactions  . Codeine Other (See Comments)    GI upset ONLY in the cough syrup   . Niacin Hives    Current Outpatient Prescriptions on File Prior to Visit  Medication Sig Dispense Refill  . alprazolam (XANAX) 2 MG tablet Take 2 mg by mouth at bedtime.      [name] Kitchen amLODipine (NORVASC) 5 MG tablet TAKE 1 TABLET (5 MG TOTAL) BY MOUTH DAILY.  30 tablet  5  . aspirin 325 MG tablet Take 325 mg by mouth daily.      [name] Kitchen atorvastatin (LIPITOR) 40 MG tablet Take 40 mg by mouth daily.        . Coenzyme Q10 (CO Q 10 PO) Take 1 tablet by mouth daily.      . metoprolol (TOPROL-XL) 100 MG 24 hr tablet Take 100 mg by mouth daily.       [name] Kitchen  Misc Natural Products (OSTEO BI-FLEX ADV TRIPLE ST) TABS Take 2 tablets by mouth daily.       . nitroGLYCERIN (NITROSTAT) 0.4 MG SL tablet Place 0.4 mg under the tongue every 5 (five) minutes as needed. Chest pain      . OVER THE COUNTER MEDICATION Take 1 capsule by mouth daily as needed. Store brand allergy medication.      . pantoprazole (PROTONIX) 40 MG tablet Take 40 mg by mouth daily.        . propranolol (INDERAL) 10 MG tablet Take 1 tablet (10 mg total) by mouth 4 (four) times daily. As needed for palpitations  30 tablet  11  . valsartan (DIOVAN) 320 MG tablet Take 1 tablet (320 mg total) by mouth daily.  30 tablet  5  . ZETIA 10 MG tablet TAKE 1 TABLET (10 MG TOTAL) BY MOUTH DAILY.  30 tablet  3  . clopidogrel (PLAVIX) 75 MG tablet Take 75 mg by mouth daily.        No current facility-administered medications on file prior to visit.       Physical Examination    Filed  Vitals:   06/26/13 1054  BP: 128/71  Pulse: 56  Height: 5\' 10"  (1.778 m)  Weight: 194 lb (87.998 kg)  SpO2: 98%     General:  Alert and oriented, no acute distress HEENT: Normal Neck: No bruit or JVD Pulmonary: Clear to auscultation bilaterally Cardiac: Regular Rate and Rhythm without murmur Abdomen: Soft, non-tender, non-distended, normal bowel sounds, no pulsatile mass Extremities: 2+ femoral pulses   DATA:  CT angiogram the abdomen and pelvis images were reviewed today.  Current aneurysm diameter is  5.1.  This is decreased from 5.7 cm preoperatively There no  evidence of endoleak. The top portion of the stent graft is adjacent to the renal arteries and there is no evidence of migration.   ASSESSMENT:  Doing well status post Gore Excluder stent graft repair aneurysm   PLAN: Patient will return in April 2015 to review his stent graft. If the findings are overall stable at that point we will consider going to ultrasound only. He will also need repeat carotid duplex scan in a year or so.  Fabienne Bruns, MD [facility] Office: [phone] Pager: [phone]  VASCULAR QUALITY INITIATIVE FOLLOW UP DATA:  Current smoker: [ x ] yes  [  ] no  Living status: [x ]  Home  [  ] Nursing home  [  ] Homeless    MEDS:  ASA [ x ] yes  [  ] no- [  ] medical reason  [  ] non compliant  STATIN  [ x] yes  [  ] no- [  ] medical reason  [  ] non compliant  Beta blocker [ x ] yes  [  ] no- [  ] medical reason  [  ] non compliant  ACE inhibitor [  ] yes  [ x ] no- [x]  medical reason  [  ] non compliant  P2Y12 Antagonist [ x ] none  [  ] clopidogrel-Plavix  [  ] ticlopidine-Ticlid   [  ] prasugrel-Effient  [  ] ticagrelor- Brilinta    Anticoagulant [ x ] None  [  ] warfarin  [  ] rivaroxaban-Xarelto [  ] dabigatran- Pradaxa  Current Max AAA = 51 mm  Endoleak:  [ x ] no  [  ] yes- [  ] type I  [  ]  type II  [  ] type III  [  ] indeterminate  Number  of new interventions: 0 -   Date:      Why:  [  ] endoleak  [  ] limb occlusion   [  ] growth  [  ]  Migration   [  ] symtomatic/ rupture    Conversion to open repair: [  ] yes   [x  ] no   Date:   Other operation related to EVAR:  [  ] yes   [ x ] no

## 2013-07-02 ENCOUNTER — Telehealth: Payer: Self-pay | Admitting: *Deleted

## 2013-07-02 ENCOUNTER — Ambulatory Visit (HOSPITAL_COMMUNITY): Payer: BC Managed Care – PPO | Attending: Cardiology | Admitting: Radiology

## 2013-07-02 ENCOUNTER — Encounter: Payer: Self-pay | Admitting: Cardiology

## 2013-07-02 ENCOUNTER — Other Ambulatory Visit: Payer: Self-pay | Admitting: Cardiovascular Disease

## 2013-07-02 VITALS — BP 107/79 | HR 51 | Ht 70.0 in | Wt 189.0 lb

## 2013-07-02 DIAGNOSIS — I2510 Atherosclerotic heart disease of native coronary artery without angina pectoris: Secondary | ICD-10-CM | POA: Insufficient documentation

## 2013-07-02 DIAGNOSIS — F17200 Nicotine dependence, unspecified, uncomplicated: Secondary | ICD-10-CM | POA: Insufficient documentation

## 2013-07-02 DIAGNOSIS — R000 Tachycardia, unspecified: Secondary | ICD-10-CM | POA: Insufficient documentation

## 2013-07-02 DIAGNOSIS — I252 Old myocardial infarction: Secondary | ICD-10-CM | POA: Insufficient documentation

## 2013-07-02 DIAGNOSIS — R0789 Other chest pain: Secondary | ICD-10-CM | POA: Insufficient documentation

## 2013-07-02 DIAGNOSIS — R0609 Other forms of dyspnea: Secondary | ICD-10-CM | POA: Insufficient documentation

## 2013-07-02 DIAGNOSIS — Z8249 Family history of ischemic heart disease and other diseases of the circulatory system: Secondary | ICD-10-CM | POA: Insufficient documentation

## 2013-07-02 DIAGNOSIS — I739 Peripheral vascular disease, unspecified: Secondary | ICD-10-CM | POA: Insufficient documentation

## 2013-07-02 DIAGNOSIS — R002 Palpitations: Secondary | ICD-10-CM | POA: Insufficient documentation

## 2013-07-02 DIAGNOSIS — R0989 Other specified symptoms and signs involving the circulatory and respiratory systems: Secondary | ICD-10-CM | POA: Insufficient documentation

## 2013-07-02 DIAGNOSIS — I779 Disorder of arteries and arterioles, unspecified: Secondary | ICD-10-CM | POA: Insufficient documentation

## 2013-07-02 DIAGNOSIS — R61 Generalized hyperhidrosis: Secondary | ICD-10-CM | POA: Insufficient documentation

## 2013-07-02 DIAGNOSIS — R079 Chest pain, unspecified: Secondary | ICD-10-CM

## 2013-07-02 DIAGNOSIS — Z951 Presence of aortocoronary bypass graft: Secondary | ICD-10-CM | POA: Insufficient documentation

## 2013-07-02 DIAGNOSIS — E785 Hyperlipidemia, unspecified: Secondary | ICD-10-CM

## 2013-07-02 DIAGNOSIS — I10 Essential (primary) hypertension: Secondary | ICD-10-CM | POA: Insufficient documentation

## 2013-07-02 DIAGNOSIS — R0602 Shortness of breath: Secondary | ICD-10-CM

## 2013-07-02 MED ORDER — ASPIRIN 81 MG PO TABS: 81.0000 mg | ORAL_TABLET | Freq: Every day | ORAL | Status: DC

## 2013-07-02 MED ORDER — RIVAROXABAN 20 MG PO TABS: 20.0000 mg | ORAL_TABLET | Freq: Every day | ORAL | Status: DC

## 2013-07-02 MED ORDER — REGADENOSON 0.4 MG/5ML IV SOLN
0.4000 mg | Freq: Once | INTRAVENOUS | Status: AC
  Administered 2013-07-02: 0.4 mg via INTRAVENOUS

## 2013-07-02 MED ORDER — TECHNETIUM TC 99M SESTAMIBI GENERIC - CARDIOLITE
33.0000 | Freq: Once | INTRAVENOUS | Status: AC | PRN
  Administered 2013-07-02: 33 via INTRAVENOUS

## 2013-07-02 MED ORDER — TECHNETIUM TC 99M SESTAMIBI GENERIC - CARDIOLITE
11.0000 | Freq: Once | INTRAVENOUS | Status: AC | PRN
  Administered 2013-07-02: 11 via INTRAVENOUS

## 2013-07-02 NOTE — Telephone Encounter (Signed)
ecardio 30 day monitor showed paroxysmal atrial fibrillation. [name] recommends Xarelto 20 mg daily,

## 2013-07-02 NOTE — Telephone Encounter (Signed)
Spoke with pt/ he will reduce asa to 81 mg, he will start xarelto 20 mg with evening meal.  Pt is aware when he has PAF.  Pt was told to call in,explaining that his meds may need to be adjusted. He was offered an app 11/18 but declined for an app 11/21.  xarelto was explained and precautions were discussed.

## 2013-07-02 NOTE — Progress Notes (Signed)
[facility] SITE 3 NUCLEAR MED [address] [place], [place] [address] [phone]    Cardiology Nuclear Med Study  Tom [name] is a 62 y.o. male     MRN : [id number]     DOB: May 17, 1951  Procedure Date: 07/02/2013  Nuclear Med Background Indication for Stress Test:  Evaluation for Ischemia and Graft Patency History:  CAD; MI; CABG; AAA Repair; '13 [id number], EF=60% Cardiac Risk Factors: Carotid Disease, Family History - CAD, Hypertension, Lipids, PVD and Smoker  Symptoms:  Chest Pressure with and without Exercise.  (last episode of chest discomfort was last night), Diaphoresis, DOE, Palpitations and Rapid HR   Nuclear Pre-Procedure Caffeine/Decaff Intake:  None > 12 hrs NPO After: 6:00pm   Lungs:  Slight rhonchi, but no wheezing. O2 Sat: 97% on room air. IV 0.9% NS with Angio Cath:  20g  IV Site: R Antecubital x 1, tolerated well IV Started by:  Irean Hong, RN  Chest Size (in):  44 Cup Size: n/a  Height: 5\' 10"  (1.778 m)  Weight:  189 lb (85.73 kg)  BMI:  Body mass index is 27.12 kg/(m^2). Tech Comments:  Took Toprol this am; held inderal.    Nuclear Med Study 1 or 2 day study: 1 day  Stress Test Type:  Treadmill/Lexiscan  Reading MD: Tobias Alexander, MD  Order Authorizing Provider:  Kristeen Miss, MD  Resting Radionuclide: Technetium 58m Sestamibi  Resting Radionuclide Dose: 11.0 mCi   Stress Radionuclide:  Technetium 62m Sestamibi  Stress Radionuclide Dose: 33.0 mCi           Stress Protocol Rest HR: 51 Stress HR: 93  Rest BP: 107/79 Stress BP: 147/86  Exercise Time (min): 2:00 METS: n/a   Predicted Max HR: 159 bpm % Max HR: 58.49 bpm Rate Pressure Product: 46962   Dose of Adenosine (mg):  n/a Dose of Lexiscan: 0.4 mg  Dose of Atropine (mg): n/a Dose of Dobutamine: n/a mcg/kg/min (at max HR)  Stress Test Technologist: Smiley Houseman, CMA-N  Nuclear Technologist:  Domenic Polite, CNMT     Rest Procedure:  Myocardial perfusion imaging was  performed at rest 45 minutes following the intravenous administration of Technetium 15m Sestamibi.  Rest ECG: Sinus bradycardia  Stress Procedure:  The patient received IV Lexiscan 0.4 mg over 15-seconds with concurrent low level exercise and then Technetium 85m Sestamibi was injected at 30-seconds while the patient continued walking one more minute.  He c/o slight chest tightness, shortness of breath and a "tad" lightheaded with Lexiscan.  Quantitative spect images were obtained after a 45-minute delay.  Stress ECG: No significant change from baseline ECG  QPS Raw Data Images:  Normal; no motion artifact; normal heart/lung ratio. Extracardiac activity affecting counts in the inferolateral wall.  Stress Images:  Decrease uptake in the basal and mid inferior and basal inferolateral walls.  Rest Images:  Decrease uptake in the basal and mid inferior and basal inferolateral walls.  Subtraction (SDS):  No evidence of ischemia. Transient Ischemic Dilatation (Normal <1.22):  1.07 Lung/Heart Ratio (Normal <0.45):  0.30  Quantitative Gated Spect Images QGS EDV:  124 ml QGS ESV:  57 ml  Impression Exercise Capacity:  Lexiscan with low level exercise. BP Response:  Normal blood pressure response. Clinical Symptoms:  There is dyspnea. ECG Impression:  No significant ST segment change suggestive of ischemia. Comparison with Prior Nuclear Study: No significant change from previous study  Overall Impression:  Low risk stress nuclear study with medium size scar in  the RCA territory. .  LV Ejection Fraction: 53%.  LV Wall Motion:  Hypokinesis in the basal and mid inferior wall.    Tobias Alexander, Rexene Edison 07/02/2013

## 2013-07-11 ENCOUNTER — Encounter: Payer: Self-pay | Admitting: Cardiovascular Disease

## 2013-07-11 ENCOUNTER — Ambulatory Visit (INDEPENDENT_AMBULATORY_CARE_PROVIDER_SITE_OTHER): Payer: BC Managed Care – PPO | Admitting: Cardiovascular Disease

## 2013-07-11 VITALS — BP 110/68 | HR 64 | Wt 195.0 lb

## 2013-07-11 DIAGNOSIS — I2510 Atherosclerotic heart disease of native coronary artery without angina pectoris: Secondary | ICD-10-CM

## 2013-07-11 DIAGNOSIS — I4891 Unspecified atrial fibrillation: Secondary | ICD-10-CM

## 2013-07-11 HISTORY — DX: Unspecified atrial fibrillation: I48.91

## 2013-07-11 NOTE — Progress Notes (Signed)
Tom [name] Date of Birth  Jan 07, 1951 [facility] [address]    [address] [place], [place]  [address] [phone]  Fax  [phone]  Problem list: 1. Coronary artery disease-status post CABG. February 2007. He had a LIMA  to the LAD and a SVG to the right coronary artery. 2. Hypertension 3. Hyperlipidemia 4. Renal insufficiency 5. Peripheral Vascular disease:  S/p AAA stent graft repair ( Oct. 2013)  s/p renal artery stenting  ( Dec. 2013)  History of Present Illness:  62 y.o. year-old gentleman with a history of coronary disease. He status post coronary artery bypass grafting in February 2007. He had a left internal mammary artery to the LAD and a saphenous vein graft to the right coronary artery. He also has a history of hypercholesterolemia, hypertension, and ongoing cigarette smoking.  He's not had any episodes of chest pain, shortness breath, syncope, or presyncope.  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 He does not do any regular aerobic exercise.  He's not had any recurrent episodes of chest pain. He tries to watch his salt intake.  He still smokes about a half a pack of cigarettes a day but has cut back from 2 packs a day.  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  November 22, 2012:  He has had an AAA stent graft repair and renal artery stenting since I last saw him.  He has had several episodes of chest pressure / tightness.  He found his BP and Hr  to be elevated.  He took and extra Toprol and ASA and felt better within an hour.  Oct. 24, 2014:  Khaza is having CP and palpitations .  Some relief with Inderal but occasionally has to take some NTG.  Symptoms seen to occur more at night - not during the day while he is working.  stil smoking 1/2 ppd.  Nov. 21, 2014:  Trevel is doing ok.  He was diagnosed with atrial fib by event monitor.  He has had several episodes of PAF - resolved after several  propranolol   Current Outpatient Prescriptions on File Prior to Visit  Medication Sig Dispense Refill  . alprazolam (XANAX) 2 MG tablet Take 2 mg by mouth at bedtime.      [name] Kitchen amLODipine (NORVASC) 5 MG tablet TAKE 1 TABLET (5 MG TOTAL) BY MOUTH DAILY.  30 tablet  5  . aspirin 81 MG tablet Take 1 tablet (81 mg total) by mouth daily.      [name] Kitchen atorvastatin (LIPITOR) 40 MG tablet Take 40 mg by mouth daily.        . Coenzyme Q10 (CO Q 10 PO) Take 1 tablet by mouth daily.      . metoprolol (TOPROL-XL) 100 MG 24 hr tablet Take 100 mg by mouth daily.       . Misc Natural Products (OSTEO BI-FLEX ADV TRIPLE ST) TABS Take 2 tablets by mouth daily.       . nitroGLYCERIN (NITROSTAT) 0.4 MG SL tablet Place 0.4 mg under the tongue every 5 (five) minutes as needed. Chest pain      . OVER THE COUNTER MEDICATION Take 1 capsule by mouth daily as needed. Store brand allergy medication.      . pantoprazole (PROTONIX) 40 MG tablet Take 40 mg by mouth daily.        . Rivaroxaban (XARELTO) 20 MG TABS tablet Take 1 tablet (20 mg  total) by mouth daily with supper.  30 tablet  6  . valsartan (DIOVAN) 320 MG tablet Take 1 tablet (320 mg total) by mouth daily.  30 tablet  5  . ZETIA 10 MG tablet TAKE 1 TABLET (10 MG TOTAL) BY MOUTH DAILY.  30 tablet  3   No current facility-administered medications on file prior to visit.    Allergies  Allergen Reactions  . Codeine Other (See Comments)    GI upset ONLY in the cough syrup   . Niacin Hives    Past Medical History  Diagnosis Date  . Anxiety   . Renal insufficiency   . Hypertension   . Hypercholesteremia   . AAA (abdominal aortic aneurysm)   . Cancer 2013    skin cancer,   MOHS surgery  . COPD (chronic obstructive pulmonary disease)   . Myocardial infarction 1997  . Complication of anesthesia     hard to sedate  . Coronary artery disease   . GERD (gastroesophageal reflux disease)     Past Surgical History  Procedure Laterality Date  . Coronary artery  bypass graft  09/2005    x2 LIMA to LAD saphenous vein to RCA  . Abdominal aortic aneurysm repair      EVAR  . Renal angiogram Left Dec. 20, 2013    Left Renal angiogram and  Left Renal stent    History  Smoking status  . Current Every Day Smoker -- 0.50 packs/day for 44 years  . Types: Cigarettes  Smokeless tobacco  . Never Used    Comment: less than 1 ppd    History  Alcohol Use No    Family History  Problem Relation Age of Onset  . Heart disease Father   . Hypertension Father   . Hyperlipidemia Father   . Diabetes Father   . Heart disease Mother   . Hypertension Mother   . Hyperlipidemia Mother   . AAA (abdominal aortic aneurysm) Maternal Uncle   . Cancer Maternal Uncle     Reviw of Systems:  Reviewed in the HPI.  All other systems are negative.  Physical Exam: BP 110/68  Pulse 64  Wt 195 lb (88.451 kg) The patient is alert and oriented x 3.  The mood and affect are normal.   Skin: warm and dry.  Color is normal.    HEENT:   the sclera are nonicteric.  The mucous membranes are moist.  The carotids are 2+ without bruits.  There is no thyromegaly.  There is no JVD.    Lungs: clear.  The chest wall is non tender.    Heart: regular rate with a normal S1 and S2.  There are no murmurs, gallops, or rubs. The PMI is not displaced.     Abdomen: good bowel sounds.  There is no guarding or rebound.    Extremities:  no clubbing, cyanosis, or edema.  The legs are without rashes.  The distal pulses are intact.   Neuro:  Cranial nerves II - XII are intact.  Motor and sensory functions are intact.    The gait is normal.  ECG:  Assessment / Plan:

## 2013-07-11 NOTE — Assessment & Plan Note (Signed)
His has PAF.  In NSR today.  conintue xarelto.  No additional meds at this point.

## 2013-07-11 NOTE — Assessment & Plan Note (Signed)
Stable, no angina  

## 2013-07-11 NOTE — Patient Instructions (Signed)
Your physician wants you to follow-up in: 6 months  You will receive a reminder letter in the mail two months in advance. If you don't receive a letter, please call our office to schedule the follow-up appointment.  Your physician recommends that you continue on your current medications as directed. Please refer to the Current Medication list given to you today.  

## 2013-07-12 ENCOUNTER — Other Ambulatory Visit: Payer: Self-pay | Admitting: Cardiovascular Disease

## 2013-07-27 ENCOUNTER — Other Ambulatory Visit: Payer: Self-pay | Admitting: Cardiovascular Disease

## 2013-09-27 ENCOUNTER — Other Ambulatory Visit: Payer: Self-pay | Admitting: Cardiovascular Disease

## 2013-11-19 ENCOUNTER — Other Ambulatory Visit: Payer: Self-pay | Admitting: Vascular Surgery

## 2013-11-19 LAB — BUN: BUN: 21 mg/dL (ref 6–23)

## 2013-11-19 LAB — CREATININE, SERUM: Creat: 1.42 mg/dL — ABNORMAL HIGH (ref 0.50–1.35)

## 2013-11-21 ENCOUNTER — Other Ambulatory Visit: Payer: Self-pay | Admitting: Cardiovascular Disease

## 2013-11-26 ENCOUNTER — Encounter: Payer: Self-pay | Admitting: Vascular Surgery

## 2013-11-27 ENCOUNTER — Ambulatory Visit
Admission: RE | Admit: 2013-11-27 | Discharge: 2013-11-27 | Disposition: A | Discharge status: Home / Self Care | Payer: BC Managed Care – PPO | Source: Ambulatory Visit | Attending: Vascular Surgery | Admitting: Vascular Surgery

## 2013-11-27 ENCOUNTER — Ambulatory Visit (INDEPENDENT_AMBULATORY_CARE_PROVIDER_SITE_OTHER): Payer: BC Managed Care – PPO | Admitting: Vascular Surgery

## 2013-11-27 ENCOUNTER — Encounter: Payer: Self-pay | Admitting: Vascular Surgery

## 2013-11-27 VITALS — BP 146/78 | HR 58 | Temp 97.8°F | Resp 16 | Ht 70.0 in | Wt 199.0 lb

## 2013-11-27 DIAGNOSIS — I701 Atherosclerosis of renal artery: Secondary | ICD-10-CM

## 2013-11-27 DIAGNOSIS — Z48812 Encounter for surgical aftercare following surgery on the circulatory system: Secondary | ICD-10-CM

## 2013-11-27 DIAGNOSIS — I714 Abdominal aortic aneurysm, without rupture, unspecified: Secondary | ICD-10-CM

## 2013-11-27 MED ORDER — IOHEXOL 350 MG/ML SOLN
75.0000 mL | Freq: Once | INTRAVENOUS | Status: AC | PRN
  Administered 2013-11-27: 75 mL via INTRAVENOUS

## 2013-11-27 NOTE — Progress Notes (Signed)
History of Present Illness:  Patient is a 63 y.o.  male who presents for follow-up evaluation of AAA. He underwent Gore Excluder aneurysm stent graft repair in October 2013. He has also had a left renal artery stent and has known carotid stenosis of 40%. The patient denies new abdominal or back pain.  He denies any symptoms of TIA amaurosis or stroke. He has not required any additions to his blood pressure medications although he is taking extra beta blocker when he has symptoms of his heart racing. He is now on Xarelto for atrial fibrillation. The patient's atherosclerotic risk factors remain tobacco abuse hypertension elevated cholesterol.  He has been having some episodes of tachycardia with hypertension and is currently under evaluation by Dr. Acie Fredrickson for this. Otherwise his medical problems are stable and followed by his primary care physician. Greater than 3 minutes they spent regarding smoking cessation counseling.    Past Medical History  Diagnosis Date  . Anxiety   . Renal insufficiency   . Hypertension   . Hypercholesteremia   . AAA (abdominal aortic aneurysm)   . Cancer 2013    skin cancer,   MOHS surgery  . COPD (chronic obstructive pulmonary disease)   . Myocardial infarction 1997  . Complication of anesthesia     hard to sedate  . Coronary artery disease   . GERD (gastroesophageal reflux disease)   . Atrial fibrillation     Review of Systems:  Neurologic: denies symptoms of TIA, amaurosis, or stroke Cardiac:denies shortness of breath or chest pain Pulmonary: denies cough or wheeze Abdomen: denies abdominal pain nausea or vomiting    History      Social History   .  Marital Status:  Married       Spouse Name:  N/A       Number of Children:  1 s   .  Years of Education:  N/A      Occupational History   .  Not on file.      Social History Main Topics   .  Smoking status:  Current Every Day Smoker -- 0.50 packs/day for 44 years       Types:  Cigarettes   .  Smokeless  tobacco:  Never Used         Comment: less than 1 ppd   .  Alcohol Use:  No   .  Drug Use:  No   .  Sexual Activity:  Not on file      Other Topics  Concern   .  Not on file      Social History Narrative   .  No narrative on file       Allergies   Allergen  Reactions   .  Codeine  Other (See Comments)       GI upset ONLY in the cough syrup    .  Niacin  Hives       Current Outpatient Prescriptions  Medication Sig Dispense Refill  . alprazolam (XANAX) 2 MG tablet Take 2 mg by mouth at bedtime.      [name] Kitchen amLODipine (NORVASC) 5 MG tablet TAKE 1 TABLET (5 MG TOTAL) BY MOUTH DAILY.  30 tablet  3  . aspirin 81 MG tablet Take 1 tablet (81 mg total) by mouth daily.      [name] Kitchen atorvastatin (LIPITOR) 40 MG tablet Take 40 mg by mouth daily.        . Coenzyme Q10 (CO Q 10 PO) Take 1 tablet  by mouth daily.      . metoprolol (TOPROL-XL) 100 MG 24 hr tablet Take 100 mg by mouth daily.       . Misc Natural Products (OSTEO BI-FLEX ADV TRIPLE ST) TABS Take 2 tablets by mouth daily.       . nitroGLYCERIN (NITROSTAT) 0.4 MG SL tablet Place 0.4 mg under the tongue every 5 (five) minutes as needed. Chest pain      . OVER THE COUNTER MEDICATION Take 1 capsule by mouth daily as needed. Store brand allergy medication.      . pantoprazole (PROTONIX) 40 MG tablet Take 40 mg by mouth daily.        . propranolol (INDERAL) 10 MG tablet Take 10 mg by mouth. As needed for palpitations      . Rivaroxaban (XARELTO) 20 MG TABS tablet Take 1 tablet (20 mg total) by mouth daily with supper.  30 tablet  6  . valsartan (DIOVAN) 320 MG tablet TAKE 1 TABLET (320 MG TOTAL) BY MOUTH DAILY.  30 tablet  5  . ZETIA 10 MG tablet TAKE 1 TABLET (10 MG TOTAL) BY MOUTH DAILY.  30 tablet  3   No current facility-administered medications for this visit.       Physical Examination     Filed Vitals:   11/27/13 0956  BP: 146/78  Pulse: 58  Temp: 97.8 F (36.6 C)  TempSrc: Oral  Resp: 16  Height: 5\' 10"  (1.778 m)  Weight: 199 lb  (90.266 kg)  SpO2: 99%    General:  Alert and oriented, no acute distress HEENT: Normal Neck: Left-sided carotid bruit, no right carotid bruit 2+ carotid pulses Pulmonary: Clear to auscultation bilaterally Cardiac: Regular Rate and Rhythm without murmur Abdomen: Soft, non-tender, non-distended, normal bowel sounds, no pulsatile mass Extremities: 2+ femoral pulses, 1+ popliteal 2+ dorsalis pedis pulses bilaterally   DATA:   CT angiogram the abdomen and pelvis images were reviewed today.  Current aneurysm diameter is  4.9 decreased from 5.2 cm.  This is decreased from 5.7 cm preoperatively There no  evidence of endoleak. The top portion of the stent graft is adjacent to the renal arteries and there is no evidence of migration. He does have some neointimal changes within the stent graft as well as a left renal stent a left renal stent is patent  Serum creatinine was 1.4 increased from 1.3 a year ago.     ASSESSMENT:   Doing well status post Gore Excluder stent graft repair aneurysm   PLAN: Patient will return in April 2016 to review his stent graft and left renal stent with ultrasound.He will also need repeat carotid duplex scan in a year or so. He will again try to quit smoking.  Ruta Hinds, MD [facility] Office: [phone] Pager: [phone]  VASCULAR QUALITY INITIATIVE FOLLOW UP DATA:  Current smoker: [ x ] yes  [  ] no  Living status: [x ]  Home  [  ] Nursing home  [  ] Homeless     MEDS:  ASA [ x ] yes  [  ] no- [  ] medical reason  [  ] non compliant  STATIN  [ x] yes  [  ] no- [  ] medical reason  [  ] non compliant  Beta blocker [ x ] yes  [  ] no- [  ] medical reason  [  ] non compliant  ACE inhibitor [  ] yes  [  x ] no- [x]  medical reason  [  ] non compliant  P2Y12 Antagonist [ x ] none  [  ] clopidogrel-Plavix  [  ] ticlopidine-Ticlid    [  ] prasugrel-Effient  [  ] ticagrelor- Brilinta    Anticoagulant [  ] None  [  ]  warfarin  [ x ] rivaroxaban-Xarelto [  ] dabigatran- Pradaxa  Current Max AAA = 49 mm  Endoleak:  [ x ] no  [  ] yes- [  ] type I  [  ] type II  [  ] type III  [  ] indeterminate  Number of new interventions: 0 -   Date:      Why:  [  ] endoleak  [  ] limb occlusion    [  ] growth  [  ]  Migration   [  ] symtomatic/ rupture    Conversion to open repair: [  ] yes   [x  ] no   Date:   Other operation related to EVAR:  [  ] yes   [ x ] no

## 2013-11-28 NOTE — Addendum Note (Signed)
Addended by: Mena Goes on: 11/28/2013 09:43 AM   Modules accepted: Orders

## 2013-12-29 ENCOUNTER — Telehealth: Payer: Self-pay | Admitting: Cardiovascular Disease

## 2013-12-29 DIAGNOSIS — E785 Hyperlipidemia, unspecified: Secondary | ICD-10-CM

## 2013-12-29 NOTE — Telephone Encounter (Signed)
New Message:  Pt's wife is calling to see if her husband needs to have fasting blood work drawn. I informed the pt's wife that her husband has no lab orders or a lab appt scheduled... She is requesting to speak to the nurse to find out if her husband needs labs drawn.

## 2013-12-29 NOTE — Telephone Encounter (Signed)
Spoke with patient's wife who called to find out if patient needs fasting lab work when he comes in for office visit this Friday.  I reviewed patient's chart and ordered bmet, lipid, and liver and advised wife that patient should fast for this appointment.  Patient's wife verbalized understanding and agreement.  Orders in epic

## 2014-01-02 ENCOUNTER — Encounter (INDEPENDENT_AMBULATORY_CARE_PROVIDER_SITE_OTHER): Payer: Self-pay

## 2014-01-02 ENCOUNTER — Ambulatory Visit (INDEPENDENT_AMBULATORY_CARE_PROVIDER_SITE_OTHER): Payer: BC Managed Care – PPO | Admitting: Cardiovascular Disease

## 2014-01-02 ENCOUNTER — Other Ambulatory Visit: Payer: BC Managed Care – PPO

## 2014-01-02 ENCOUNTER — Encounter: Payer: Self-pay | Admitting: Cardiovascular Disease

## 2014-01-02 VITALS — BP 128/71 | HR 49 | Ht 70.0 in | Wt 197.0 lb

## 2014-01-02 DIAGNOSIS — I4891 Unspecified atrial fibrillation: Secondary | ICD-10-CM

## 2014-01-02 DIAGNOSIS — I2510 Atherosclerotic heart disease of native coronary artery without angina pectoris: Secondary | ICD-10-CM

## 2014-01-02 DIAGNOSIS — E7800 Pure hypercholesterolemia, unspecified: Secondary | ICD-10-CM

## 2014-01-02 DIAGNOSIS — I714 Abdominal aortic aneurysm, without rupture, unspecified: Secondary | ICD-10-CM

## 2014-01-02 DIAGNOSIS — I10 Essential (primary) hypertension: Secondary | ICD-10-CM

## 2014-01-02 LAB — LIPID PANEL
CHOL/HDL RATIO: 4
Cholesterol: 118 mg/dL (ref 0–200)
HDL: 33.4 mg/dL — AB (ref >39.00)
LDL Cholesterol: 66 mg/dL (ref 0–99)
Triglycerides: 93 mg/dL (ref 0.0–149.0)
VLDL: 18.6 mg/dL (ref 0.0–40.0)

## 2014-01-02 LAB — HEPATIC FUNCTION PANEL
ALK PHOS: 67 U/L (ref 39–117)
ALT: 24 U/L (ref 0–53)
AST: 25 U/L (ref 0–37)
Albumin: 4.2 g/dL (ref 3.5–5.2)
BILIRUBIN TOTAL: 0.7 mg/dL (ref 0.2–1.2)
Bilirubin, Direct: 0.1 mg/dL (ref 0.0–0.3)
Total Protein: 7.3 g/dL (ref 6.0–8.3)

## 2014-01-02 LAB — BASIC METABOLIC PANEL
BUN: 19 mg/dL (ref 6–23)
CALCIUM: 9.4 mg/dL (ref 8.4–10.5)
CO2: 27 mEq/L (ref 19–32)
CREATININE: 1.3 mg/dL (ref 0.4–1.5)
Chloride: 104 mEq/L (ref 96–112)
GFR: 59.49 mL/min — ABNORMAL LOW (ref >60.00)
GLUCOSE: 102 mg/dL — AB (ref 70–99)
Potassium: 4.6 mEq/L (ref 3.5–5.1)
Sodium: 138 mEq/L (ref 135–145)

## 2014-01-02 NOTE — Progress Notes (Signed)
Tom [name] Date of Birth  11-Dec-1950  [facility] [address]    [place], [place]  [address] [phone]  Fax  [phone]  Problem list: 1. Coronary artery disease-status post CABG. February 2007. He had a LIMA  to the LAD and a SVG to the right coronary artery. 2. Hypertension 3. Hyperlipidemia 4. Renal insufficiency 5. Peripheral Vascular disease:  S/p AAA stent graft repair ( Oct. 2013)  s/p renal artery stenting  ( Dec. 2013)  History of Present Illness:  63 y.o. year-old gentleman with a history of coronary disease. He status post coronary artery bypass grafting in February 2007. He had a left internal mammary artery to the LAD and a saphenous vein graft to the right coronary artery. He also has a history of hypercholesterolemia, hypertension, and ongoing cigarette smoking. He's not had any episodes of chest pain, shortness breath, syncope, or presyncope.  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 He does not do any regular aerobic exercise. He's not had any recurrent episodes of chest pain. He tries to watch his salt intake.  He still smokes about a half a pack of cigarettes a day but has cut back from 2 packs a day. 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  November 22, 2012:  He has had an AAA stent graft repair and renal artery stenting since I last saw him.  He has had several episodes of chest pressure / tightness.  He found his BP and Hr  to be elevated.  He took and extra Toprol and ASA and felt better within an hour.  Oct. 24, 2014:  Tom [name] is having CP and palpitations .  Some relief with Inderal but occasionally has to take some NTG.  Symptoms seen to occur more at night - not during the day while he is working.  stil smoking 1/2 ppd.  Nov. 21, 2014:  Tom [name] is doing ok.  He was diagnosed with atrial fib by event monitor.  He has had several episodes of PAF - resolved after several propranolol  Jan 02, 2014:  He is doing well.  Still have PAF - probably once a month.  Will last 4-6 hours.   He does not drink ETOH.   still smoking some .  Works as a Curator.    Current Outpatient Prescriptions on File Prior to Visit  Medication Sig Dispense Refill  . alprazolam (XANAX) 2 MG tablet Take 2 mg by mouth at bedtime.      [name] Kitchen amLODipine (NORVASC) 5 MG tablet TAKE 1 TABLET (5 MG TOTAL) BY MOUTH DAILY.  30 tablet  3  . aspirin 81 MG tablet Take 1 tablet (81 mg total) by mouth daily.      [name] Kitchen atorvastatin (LIPITOR) 40 MG tablet Take 40 mg by mouth daily.        . Coenzyme Q10 (CO Q 10 PO) Take 1 tablet by mouth daily.      . metoprolol (TOPROL-XL) 100 MG 24 hr tablet Take 100 mg by mouth daily.       . Misc Natural Products (OSTEO BI-FLEX ADV TRIPLE ST) TABS Take 2 tablets by mouth daily.       . nitroGLYCERIN (NITROSTAT) 0.4 MG SL tablet Place 0.4 mg under the tongue every 5 (five) minutes as needed. Chest pain      . OVER THE COUNTER MEDICATION Take 1 capsule by mouth daily as needed. Store  brand allergy medication.      . pantoprazole (PROTONIX) 40 MG tablet Take 40 mg by mouth daily.        . propranolol (INDERAL) 10 MG tablet Take 10 mg by mouth. As needed for palpitations      . Rivaroxaban (XARELTO) 20 MG TABS tablet Take 1 tablet (20 mg total) by mouth daily with supper.  30 tablet  6  . valsartan (DIOVAN) 320 MG tablet TAKE 1 TABLET (320 MG TOTAL) BY MOUTH DAILY.  30 tablet  5  . ZETIA 10 MG tablet TAKE 1 TABLET (10 MG TOTAL) BY MOUTH DAILY.  30 tablet  3   No current facility-administered medications on file prior to visit.    Allergies  Allergen Reactions  . Codeine Other (See Comments)    GI upset ONLY in the cough syrup   . Niacin Hives    Past Medical History  Diagnosis Date  . Anxiety   . Renal insufficiency   . Hypertension   . Hypercholesteremia   . AAA (abdominal aortic aneurysm)   . Cancer 2013    skin cancer,   MOHS surgery  . COPD (chronic obstructive pulmonary  disease)   . Myocardial infarction 1997  . Complication of anesthesia     hard to sedate  . Coronary artery disease   . GERD (gastroesophageal reflux disease)   . Atrial fibrillation     Past Surgical History  Procedure Laterality Date  . Coronary artery bypass graft  09/2005    x2 LIMA to LAD saphenous vein to RCA  . Abdominal aortic aneurysm repair      EVAR  . Renal angiogram Left Dec. 20, 2013    Left Renal angiogram and  Left Renal stent    History  Smoking status  . Current Every Day Smoker -- 0.50 packs/day for 44 years  . Types: Cigarettes  Smokeless tobacco  . Never Used    Comment: less than 1 ppd    History  Alcohol Use No    Family History  Problem Relation Age of Onset  . Heart disease Father   . Hypertension Father   . Hyperlipidemia Father   . Diabetes Father   . Heart disease Mother   . Hypertension Mother   . Hyperlipidemia Mother   . AAA (abdominal aortic aneurysm) Maternal Uncle   . Cancer Maternal Uncle     Reviw of Systems:  Reviewed in the HPI.  All other systems are negative.  Physical Exam: There were no vitals taken for this visit. The patient is alert and oriented x 3.  The mood and affect are normal.   Skin: warm and dry.  Color is normal.   HEENT:   the sclera are nonicteric.  The mucous membranes are moist.  The carotids are 2+ without bruits.  There is no thyromegaly.  There is no JVD.   Lungs: clear.  The chest wall is non tender.   Heart: regular rate with a normal S1 and S2.  There are no murmurs, gallops, or rubs. The PMI is not displaced.    Abdomen: good bowel sounds.  There is no guarding or rebound.   Extremities:  no clubbing, cyanosis, or edema.  The legs are without rashes.  The distal pulses are intact.  Neuro:  Cranial nerves II - XII are intact.  Motor and sensory functions are intact.   The gait is normal.  ECG: Jan 02, 2014:  Marked sinus brady 49, otherwise normal ECG  Assessment / Plan:

## 2014-01-02 NOTE — Assessment & Plan Note (Signed)
No  symptoms of angina. Continue current medications

## 2014-01-02 NOTE — Assessment & Plan Note (Signed)
He continues to have PAF.  Does not tolerate it all that well. Unfortunately he has significant heart disease and is not a candidate for flecainide. He has a long history of cigarette smoking and so I hesitate to use amiodarone. We could consider Multac . His left ventricle systolic function is normal.  He's not had any symptoms of congestive heart failure.  We'll continue with conservative therapy for now. He is to continue with Xarelto.

## 2014-01-02 NOTE — Patient Instructions (Signed)
Your physician recommends that you continue on your current medications as directed. Please refer to the Current Medication list given to you today.  Your physician recommends that you have lab work:  TODAY  Your physician wants you to follow-up in: 6 months with Dr. Acie Fredrickson.  You will receive a reminder letter in the mail two months in advance. If you don't receive a letter, please call our office to schedule the follow-up appointment. You will need fasting lab work at the next office visit - please do not eat or drink after midnight the night before this appointment except water

## 2014-01-25 ENCOUNTER — Other Ambulatory Visit: Payer: Self-pay | Admitting: Cardiovascular Disease

## 2014-01-27 ENCOUNTER — Other Ambulatory Visit: Payer: Self-pay | Admitting: Cardiovascular Disease

## 2014-01-28 ENCOUNTER — Other Ambulatory Visit: Payer: Self-pay

## 2014-01-28 MED ORDER — AMLODIPINE BESYLATE 5 MG PO TABS: ORAL_TABLET | ORAL | Status: DC

## 2014-01-30 ENCOUNTER — Other Ambulatory Visit: Payer: Self-pay | Admitting: Cardiovascular Disease

## 2014-02-20 ENCOUNTER — Other Ambulatory Visit: Payer: Self-pay | Admitting: Cardiovascular Disease

## 2014-04-02 ENCOUNTER — Other Ambulatory Visit: Payer: Self-pay | Admitting: Cardiovascular Disease

## 2014-04-08 ENCOUNTER — Other Ambulatory Visit: Payer: Self-pay | Admitting: Cardiovascular Disease

## 2014-04-15 ENCOUNTER — Telehealth: Payer: Self-pay | Admitting: Nurse Practitioner

## 2014-04-15 ENCOUNTER — Encounter: Payer: Self-pay | Admitting: Cardiovascular Disease

## 2014-04-15 NOTE — Telephone Encounter (Signed)
Cecille Rubin s/w with pt's wife was advised to make appointment with Cecille Rubin when Dr. Acie Fredrickson is in the office.  Appointment made today. Will route to Christen Bame, RN to let her know.

## 2014-04-15 NOTE — Telephone Encounter (Signed)
Spoke to wife today. She is calling for [name]. Having more spells of AF. Took 8 to 10 Inderal yesterday. Remains on Xarelto. Already on beta blocker therapy with resting bradycardia. Suggested OV with me and Dr. Acie Fredrickson. Appointment arranged.   Burtis Junes, RN, [place] [address] [place], [place]  [address] [phone]

## 2014-04-15 NOTE — Telephone Encounter (Signed)
New Message  Pt wife called states that the pt is having problems with his afib.. Wife states the pt feels comfortable with Cecille Rubin and would like to discuss this with her. Please call back to assist

## 2014-04-15 NOTE — Telephone Encounter (Signed)
New Message  New Message   Pt wife called states that the pt is having problems with his afib.. Please call back to assist

## 2014-04-15 NOTE — Telephone Encounter (Signed)
This encounter was created in error - please disregard.

## 2014-04-15 NOTE — Telephone Encounter (Signed)
Close Encounter Wrong Dr.  Abbott Pao wanted to speak with Truitt Merle.. Notes indicate the pt has never seen Cecille Rubin.. Re routing note//sr

## 2014-04-28 ENCOUNTER — Encounter: Payer: Self-pay | Admitting: Nurse Practitioner

## 2014-04-28 ENCOUNTER — Ambulatory Visit (INDEPENDENT_AMBULATORY_CARE_PROVIDER_SITE_OTHER): Payer: BC Managed Care – PPO | Admitting: Nurse Practitioner

## 2014-04-28 VITALS — BP 140/80 | HR 62 | Ht 70.0 in | Wt 196.0 lb

## 2014-04-28 DIAGNOSIS — R002 Palpitations: Secondary | ICD-10-CM

## 2014-04-28 DIAGNOSIS — I4891 Unspecified atrial fibrillation: Secondary | ICD-10-CM

## 2014-04-28 DIAGNOSIS — I480 Paroxysmal atrial fibrillation: Secondary | ICD-10-CM

## 2014-04-28 DIAGNOSIS — I10 Essential (primary) hypertension: Secondary | ICD-10-CM

## 2014-04-28 DIAGNOSIS — I2510 Atherosclerotic heart disease of native coronary artery without angina pectoris: Secondary | ICD-10-CM

## 2014-04-28 LAB — BASIC METABOLIC PANEL
BUN: 17 mg/dL (ref 6–23)
CO2: 27 mEq/L (ref 19–32)
Calcium: 9.3 mg/dL (ref 8.4–10.5)
Chloride: 105 mEq/L (ref 96–112)
Creatinine, Ser: 1.4 mg/dL (ref 0.4–1.5)
GFR: 54.11 mL/min — ABNORMAL LOW (ref >60.00)
Glucose, Bld: 73 mg/dL (ref 70–99)
Potassium: 4.8 mEq/L (ref 3.5–5.1)
Sodium: 138 mEq/L (ref 135–145)

## 2014-04-28 LAB — MAGNESIUM: Magnesium: 2.2 mg/dL (ref 1.5–2.5)

## 2014-04-28 NOTE — Patient Instructions (Signed)
Stay on your current medicines  You may take an extra 1/2 of metoprolol if needed  We will check lab today  We will arrange for an echocardiogram  We will arrange for an EP consult   Call the [place] office at [phone] if you have any questions, problems or concerns.

## 2014-04-28 NOTE — Progress Notes (Signed)
Tom [name] Date of Birth: 10-15-1950 Medical Record #[id number]  History of Present Illness: Tom [name] is seen back today for a work in visit. Seen for Dr. Acie Fredrickson. He is a 63 year old male that has known CAD with past CABG in 2007 with LIMA to the LAD and SVG to the RCA, HTN, HLD, renal insufficiency and PVD with past AAA stent graft repair in 2013 with prior renal artery stenting as well in 2013. He continues to smoke. He has had PAF noted on past event monitor from November of 2014.  Called here over the past several weeks - having more and more AF - basically eating Inderal "like candy". Feels pretty washed out after his spells. Visit advised.   Comes in today. Here alone. He is having more AF - now about every 2 weeks. Lasting longer - last spell was about 9 hours. Feels bad. BP and HR up with these spells. No real trigger but alcohol may have triggered his last spell. No chest pain. This has not happened at work - he does go up and down ladders with his job. No passing out. Frustrated.   Current Outpatient Prescriptions  Medication Sig Dispense Refill  . alprazolam (XANAX) 2 MG tablet Take 2 mg by mouth at bedtime.      [name] Kitchen amLODipine (NORVASC) 5 MG tablet TAKE 1 TABLET (5 MG TOTAL) BY MOUTH DAILY.  30 tablet  3  . aspirin 81 MG tablet Take 1 tablet (81 mg total) by mouth daily.      [name] Kitchen atorvastatin (LIPITOR) 40 MG tablet Take 40 mg by mouth daily.        . Coenzyme Q10 (CO Q 10 PO) Take 1 tablet by mouth daily.      . metoprolol (TOPROL-XL) 100 MG 24 hr tablet Take 100 mg by mouth daily.       . nitroGLYCERIN (NITROSTAT) 0.4 MG SL tablet Place 0.4 mg under the tongue every 5 (five) minutes as needed. Chest pain      . OVER THE COUNTER MEDICATION Take 1 capsule by mouth daily as needed. Store brand allergy medication.      . pantoprazole (PROTONIX) 40 MG tablet Take 40 mg by mouth daily.        . propranolol (INDERAL) 10 MG tablet Take 10 mg by mouth. As needed for palpitations      .  valsartan (DIOVAN) 320 MG tablet TAKE 1 TABLET (320 MG TOTAL) BY MOUTH DAILY.  30 tablet  5  . XARELTO 20 MG TABS tablet TAKE 1 TABLET (20 MG TOTAL) BY MOUTH DAILY WITH SUPPER.  30 tablet  3  . ZETIA 10 MG tablet TAKE 1 TABLET (10 MG TOTAL) BY MOUTH DAILY.  30 tablet  2   No current facility-administered medications for this visit.    Allergies  Allergen Reactions  . Codeine Other (See Comments)    GI upset ONLY in the cough syrup   . Niacin Hives    Past Medical History  Diagnosis Date  . Anxiety   . Renal insufficiency   . Hypertension   . Hypercholesteremia   . AAA (abdominal aortic aneurysm)   . Cancer 2013    skin cancer,   MOHS surgery  . COPD (chronic obstructive pulmonary disease)   . Myocardial infarction 1997  . Complication of anesthesia     hard to sedate  . Coronary artery disease   . GERD (gastroesophageal reflux disease)   . Atrial fibrillation  Past Surgical History  Procedure Laterality Date  . Coronary artery bypass graft  09/2005    x2 LIMA to LAD saphenous vein to RCA  . Abdominal aortic aneurysm repair      EVAR  . Renal angiogram Left Dec. 20, 2013    Left Renal angiogram and  Left Renal stent    History  Smoking status  . Current Every Day Smoker -- 0.50 packs/day for 44 years  . Types: Cigarettes  Smokeless tobacco  . Never Used    Comment: less than 1 ppd    History  Alcohol Use No    Family History  Problem Relation Age of Onset  . Heart disease Father   . Hypertension Father   . Hyperlipidemia Father   . Diabetes Father   . Heart disease Mother   . Hypertension Mother   . Hyperlipidemia Mother   . AAA (abdominal aortic aneurysm) Maternal Uncle   . Cancer Maternal Uncle     Review of Systems: The review of systems is per the HPI.  All other systems were reviewed and are negative.  Physical Exam: BP 140/80  Pulse 62  Ht 5\' 10"  (1.778 m)  Wt 196 lb (88.905 kg)  BMI 28.12 kg/m2 Patient is very pleasant and in no  acute distress. Skin is warm and dry. Color is normal.  HEENT is unremarkable. Normocephalic/atraumatic. PERRL. Sclera are nonicteric. Neck is supple. No masses. No JVD. Lungs are clear. Cardiac exam shows a regular rate and rhythm. Abdomen is soft. Extremities are without edema. Gait and ROM are intact. No gross neurologic deficits noted.  Wt Readings from Last 3 Encounters:  04/28/14 196 lb (88.905 kg)  01/02/14 197 lb (89.359 kg)  11/27/13 199 lb (90.266 kg)    LABORATORY DATA/PROCEDURES:  Lab Results  Component Value Date   WBC 11.7* 06/13/2012   HGB 15.3 08/09/2012   HCT 45.0 08/09/2012   PLT 194 06/13/2012   GLUCOSE 102* 01/02/2014   CHOL 118 01/02/2014   TRIG 93.0 01/02/2014   HDL 33.40* 01/02/2014   LDLCALC 66 01/02/2014   ALT 24 01/02/2014   AST 25 01/02/2014   NA 138 01/02/2014   K 4.6 01/02/2014   CL 104 01/02/2014   CREATININE 1.3 01/02/2014   BUN 19 01/02/2014   CO2 27 01/02/2014   INR 1.18 06/12/2012    BNP (last 3 results) No results found for this basename: PROBNP,  in the last 8760 hours  Myoview Impression from November 2014  Exercise Capacity: Lexiscan with low level exercise.  BP Response: Normal blood pressure response.  Clinical Symptoms: There is dyspnea.  ECG Impression: No significant ST segment change suggestive of ischemia.  Comparison with Prior Nuclear Study: No significant change from previous study  Overall Impression: Low risk stress nuclear study with medium size scar in the RCA territory. .  LV Ejection Fraction: 53%. LV Wall Motion: Hypokinesis in the basal and mid inferior wall.  Ena Dawley, H  07/02/2013    Assessment / Plan: 1. CAD - remote CABG.  2. PAF - noted on past event monitor. Discussed with Dr. Acie Fredrickson today - will refer on to EP - looks to [place] that amiodarone and Tikosyn are his only real options given that he has CAD. He is relatively young for amiodarone. Would like EP's recommendation. Will get an echo and labs today. Further  disposition to follow. He may use an extra half of Metoprolol prn.   3. Tobacco abuse  4. PVD  Patient is agreeable to this plan and will call if any problems develop in the interim.   Burtis Junes, RN, [place] [address] [place], [place]  [address] [phone]

## 2014-05-01 ENCOUNTER — Ambulatory Visit (HOSPITAL_COMMUNITY): Payer: BC Managed Care – PPO | Attending: Cardiology | Admitting: Cardiology

## 2014-05-01 DIAGNOSIS — E785 Hyperlipidemia, unspecified: Secondary | ICD-10-CM | POA: Insufficient documentation

## 2014-05-01 DIAGNOSIS — I10 Essential (primary) hypertension: Secondary | ICD-10-CM

## 2014-05-01 DIAGNOSIS — I480 Paroxysmal atrial fibrillation: Secondary | ICD-10-CM

## 2014-05-01 DIAGNOSIS — R002 Palpitations: Secondary | ICD-10-CM

## 2014-05-01 DIAGNOSIS — I4891 Unspecified atrial fibrillation: Secondary | ICD-10-CM | POA: Diagnosis present

## 2014-05-01 DIAGNOSIS — F17200 Nicotine dependence, unspecified, uncomplicated: Secondary | ICD-10-CM | POA: Insufficient documentation

## 2014-05-01 DIAGNOSIS — I2510 Atherosclerotic heart disease of native coronary artery without angina pectoris: Secondary | ICD-10-CM

## 2014-05-01 NOTE — Progress Notes (Signed)
Echo performed. 

## 2014-05-04 ENCOUNTER — Telehealth: Payer: Self-pay | Admitting: Nurse Practitioner

## 2014-05-04 NOTE — Telephone Encounter (Signed)
New message ° ° ° ° ° °Returning Danielle's call °

## 2014-05-26 ENCOUNTER — Ambulatory Visit (INDEPENDENT_AMBULATORY_CARE_PROVIDER_SITE_OTHER): Payer: BC Managed Care – PPO | Admitting: Internal Medicine

## 2014-05-26 ENCOUNTER — Encounter: Payer: Self-pay | Admitting: Internal Medicine

## 2014-05-26 VITALS — BP 130/80 | HR 56 | Ht 70.0 in | Wt 199.2 lb

## 2014-05-26 DIAGNOSIS — I480 Paroxysmal atrial fibrillation: Secondary | ICD-10-CM

## 2014-05-26 MED ORDER — DRONEDARONE HCL 400 MG PO TABS: 400.0000 mg | ORAL_TABLET | Freq: Two times a day (BID) | ORAL | Status: DC

## 2014-05-26 NOTE — Assessment & Plan Note (Signed)
He has been increasingly symptomatic. We discussed the treatment options. Amiodarone, Multaq, tikosyn and sotalol are all possible treatment options. The risks and benefits of all 4 medications were reviewed in detail. After a lengthy discussion the patient would like to try Dronenderone. He has been given samples. If it appears that this medication works for him, and his insurance is affordable for him to have the medication filled we will do so. I'll see him back in several weeks.

## 2014-05-26 NOTE — Progress Notes (Signed)
HPI Tom [name] returns today for followup. He is a pleasant middle aged man with a h/o CAD, s/p MI, HTN, who has had recurrent episodes of atrial fibrillation which have been increasingly symptomatic. He has been treated with beta blockers but has had increasingly frequent episodes. He has worn a cardiac monitor which demonstrates PAF with an RVR. Since then he has had increasingly long episodes which have increased in severity. Allergies  Allergen Reactions  . Codeine Other (See Comments)    GI upset ONLY in the cough syrup   . Niacin Hives     Current Outpatient Prescriptions  Medication Sig Dispense Refill  . alprazolam (XANAX) 2 MG tablet Take 2 mg by mouth at bedtime.      [name] Kitchen amLODipine (NORVASC) 5 MG tablet TAKE 1 TABLET (5 MG TOTAL) BY MOUTH DAILY.  30 tablet  3  . aspirin 81 MG tablet Take 1 tablet (81 mg total) by mouth daily.      [name] Kitchen atorvastatin (LIPITOR) 40 MG tablet Take 40 mg by mouth daily.        . metoprolol (TOPROL-XL) 100 MG 24 hr tablet Take 100 mg by mouth daily.       . nitroGLYCERIN (NITROSTAT) 0.4 MG SL tablet Place 0.4 mg under the tongue every 5 (five) minutes as needed. Chest pain      . OVER THE COUNTER MEDICATION Take 1 capsule by mouth daily as needed. Store brand allergy medication.      . pantoprazole (PROTONIX) 40 MG tablet Take 40 mg by mouth daily.        . propranolol (INDERAL) 10 MG tablet Take 10 mg by mouth. As needed for palpitations      . valsartan (DIOVAN) 320 MG tablet TAKE 1 TABLET (320 MG TOTAL) BY MOUTH DAILY.  30 tablet  5  . XARELTO 20 MG TABS tablet TAKE 1 TABLET (20 MG TOTAL) BY MOUTH DAILY WITH SUPPER.  30 tablet  3  . ZETIA 10 MG tablet TAKE 1 TABLET (10 MG TOTAL) BY MOUTH DAILY.  30 tablet  2  . dronedarone (MULTAQ) 400 MG tablet Take 1 tablet (400 mg total) by mouth 2 (two) times daily with a meal.  60 tablet     No current facility-administered medications for this visit.     Past Medical History  Diagnosis Date  . Anxiety    . Renal insufficiency   . Hypertension   . Hypercholesteremia   . AAA (abdominal aortic aneurysm)   . Cancer 2013    skin cancer,   MOHS surgery  . COPD (chronic obstructive pulmonary disease)   . Myocardial infarction 1997  . Complication of anesthesia     hard to sedate  . Coronary artery disease   . GERD (gastroesophageal reflux disease)   . Atrial fibrillation     ROS:   All systems reviewed and negative except as noted in the HPI.   Past Surgical History  Procedure Laterality Date  . Coronary artery bypass graft  09/2005    x2 LIMA to LAD saphenous vein to RCA  . Abdominal aortic aneurysm repair      EVAR  . Renal angiogram Left Dec. 20, 2013    Left Renal angiogram and  Left Renal stent     Family History  Problem Relation Age of Onset  . Heart disease Father   . Hypertension Father   . Hyperlipidemia Father   . Diabetes Father   . Heart  disease Mother   . Hypertension Mother   . Hyperlipidemia Mother   . AAA (abdominal aortic aneurysm) Maternal Uncle   . Cancer Maternal Uncle      History   Social History  . Marital Status: Married    Spouse Name: N/A    Number of Children: 1 s  . Years of Education: N/A   Occupational History  . Not on file.   Social History Main Topics  . Smoking status: Current Every Day Smoker -- 0.50 packs/day for 44 years    Types: Cigarettes  . Smokeless tobacco: Never Used     Comment: less than 1 ppd  . Alcohol Use: No  . Drug Use: No  . Sexual Activity: Yes   Other Topics Concern  . Not on file   Social History Narrative  . No narrative on file     BP 130/80  Pulse 56  Ht 5\' 10"  (1.778 m)  Wt 199 lb 3.2 oz (90.357 kg)  BMI 28.58 kg/m2  SpO2 97%  Physical Exam:  Well appearing NAD HEENT: Unremarkable Neck:  No JVD, no thyromegally Lymphatics:  No adenopathy Back:  No CVA tenderness Lungs:  Clear with no wheezes HEART:  Regular rate rhythm, no murmurs, no rubs, no clicks Abd:  soft, positive  bowel sounds, no organomegally, no rebound, no guarding Ext:  2 plus pulses, no edema, no cyanosis, no clubbing Skin:  No rashes no nodules Neuro:  CN II through XII intact, motor grossly intact  EKG - NSR  Cardiac monitor - PAF  Assess/Plan:

## 2014-05-26 NOTE — Patient Instructions (Addendum)
Your physician has recommended you make the following change in your medication:  1.) begin multaq 400 mg twice daily  Your physician recommends that you schedule a follow-up appointment in: 3 weeks with Nurse for an EKG. PLEASE SCHEDULE ON A DAY THAT DR. Lovena Le IS IN CLINIC.

## 2014-06-16 ENCOUNTER — Telehealth: Payer: Self-pay | Admitting: *Deleted

## 2014-06-16 ENCOUNTER — Ambulatory Visit: Payer: BC Managed Care – PPO | Admitting: *Deleted

## 2014-06-16 ENCOUNTER — Encounter: Payer: Self-pay | Admitting: Cardiovascular Disease

## 2014-06-16 VITALS — BP 128/74 | HR 51 | Ht 70.0 in | Wt 198.0 lb

## 2014-06-16 DIAGNOSIS — I480 Paroxysmal atrial fibrillation: Secondary | ICD-10-CM

## 2014-06-16 MED ORDER — DRONEDARONE HCL 400 MG PO TABS: 400.0000 mg | ORAL_TABLET | Freq: Two times a day (BID) | ORAL | Status: DC

## 2014-06-16 NOTE — Patient Instructions (Signed)
Your physician recommends that you continue on your current medications as directed. Please refer to the Current Medication list given to you today.  We will contact you for further instructions once Dr. Lovena Le has reviewed your EKG.

## 2014-06-16 NOTE — Progress Notes (Signed)
Patient here for follow up EKG following Multaq start couple weeks ago. He tells me that he has had only one episode, lasted few minutes only, since starting Multaq. EKG shows sinus bradycardia, and to be reviewed by Dr. Lovena Le. Patient is aware we will be contacting him with instructions. 6 days of samples given to patient.

## 2014-06-16 NOTE — Telephone Encounter (Signed)
Refilled the pts med Multaq 400 mg po BID per Dr Lovena Le and nurse Trinidad Curet.  Confirmed the pharmacy of choice with the pt and current dose taking.  Pt would like [facility] sent to [facility] in [facility], agrees with the plan to continue taking this med as prescribed.  Filled med with 3 refills with 90 tab dispense.

## 2014-06-22 ENCOUNTER — Other Ambulatory Visit: Payer: Self-pay

## 2014-06-22 DIAGNOSIS — I480 Paroxysmal atrial fibrillation: Secondary | ICD-10-CM

## 2014-06-22 MED ORDER — DRONEDARONE HCL 400 MG PO TABS: 400.0000 mg | ORAL_TABLET | Freq: Two times a day (BID) | ORAL | Status: DC

## 2014-06-24 ENCOUNTER — Other Ambulatory Visit: Payer: Self-pay | Admitting: Cardiovascular Disease

## 2014-07-07 ENCOUNTER — Other Ambulatory Visit: Payer: Self-pay | Admitting: Cardiovascular Disease

## 2014-07-28 ENCOUNTER — Other Ambulatory Visit: Payer: Self-pay | Admitting: Cardiovascular Disease

## 2014-07-30 ENCOUNTER — Encounter (HOSPITAL_COMMUNITY): Payer: Self-pay | Admitting: Vascular Surgery

## 2014-09-21 ENCOUNTER — Other Ambulatory Visit: Payer: Self-pay | Admitting: Cardiovascular Disease

## 2014-09-23 ENCOUNTER — Other Ambulatory Visit: Payer: Self-pay | Admitting: Cardiovascular Disease

## 2014-09-26 ENCOUNTER — Other Ambulatory Visit: Payer: Self-pay | Admitting: Cardiovascular Disease

## 2014-10-07 ENCOUNTER — Other Ambulatory Visit: Payer: Self-pay | Admitting: Internal Medicine

## 2014-11-11 ENCOUNTER — Encounter (HOSPITAL_BASED_OUTPATIENT_CLINIC_OR_DEPARTMENT_OTHER): Payer: Self-pay | Admitting: *Deleted

## 2014-11-11 ENCOUNTER — Other Ambulatory Visit: Payer: Self-pay | Admitting: Orthopedic Surgery

## 2014-11-11 ENCOUNTER — Telehealth: Payer: Self-pay | Admitting: Cardiovascular Disease

## 2014-11-11 NOTE — Telephone Encounter (Signed)
Call from [place] at the [facility] [phone]) Pt is there; has an infected finger and needs an I and D, preferably today or possibly tomorrow. Pt is on Xarelto. Jeani Hawking would like recommendations. Discussed with Dr. Acie Fredrickson. He states it is up to the surgeon as to when to operate. Per Dr. Acie Fredrickson the patient will have bleeding on Xarelto and there is no reversal agent.  It is ok for patient to hold today and tomorrow's dose of xarelto whether they do procedure today or tomorrow.  Left message on Lynn's voicemail at the [facility].

## 2014-11-11 NOTE — Progress Notes (Signed)
Will need istat-ekg done 10/15

## 2014-11-12 ENCOUNTER — Encounter (HOSPITAL_BASED_OUTPATIENT_CLINIC_OR_DEPARTMENT_OTHER): Payer: Self-pay | Admitting: Anesthesiology

## 2014-11-12 ENCOUNTER — Ambulatory Visit (HOSPITAL_BASED_OUTPATIENT_CLINIC_OR_DEPARTMENT_OTHER): Payer: BLUE CROSS/BLUE SHIELD | Admitting: Anesthesiology

## 2014-11-12 ENCOUNTER — Ambulatory Visit (HOSPITAL_BASED_OUTPATIENT_CLINIC_OR_DEPARTMENT_OTHER)
Admission: RE | Admit: 2014-11-12 | Discharge: 2014-11-12 | Disposition: A | Discharge status: Home / Self Care | Payer: BLUE CROSS/BLUE SHIELD | Source: Ambulatory Visit | Attending: Orthopedic Surgery | Admitting: Orthopedic Surgery

## 2014-11-12 ENCOUNTER — Encounter (HOSPITAL_BASED_OUTPATIENT_CLINIC_OR_DEPARTMENT_OTHER)
Admission: RE | Disposition: A | Discharge status: Home / Self Care | Payer: Self-pay | Source: Ambulatory Visit | Attending: Orthopedic Surgery

## 2014-11-12 DIAGNOSIS — Z955 Presence of coronary angioplasty implant and graft: Secondary | ICD-10-CM | POA: Diagnosis not present

## 2014-11-12 DIAGNOSIS — F419 Anxiety disorder, unspecified: Secondary | ICD-10-CM | POA: Diagnosis not present

## 2014-11-12 DIAGNOSIS — I252 Old myocardial infarction: Secondary | ICD-10-CM | POA: Diagnosis not present

## 2014-11-12 DIAGNOSIS — K219 Gastro-esophageal reflux disease without esophagitis: Secondary | ICD-10-CM | POA: Insufficient documentation

## 2014-11-12 DIAGNOSIS — F17210 Nicotine dependence, cigarettes, uncomplicated: Secondary | ICD-10-CM | POA: Diagnosis not present

## 2014-11-12 DIAGNOSIS — Z7901 Long term (current) use of anticoagulants: Secondary | ICD-10-CM | POA: Diagnosis not present

## 2014-11-12 DIAGNOSIS — Y990 Civilian activity done for income or pay: Secondary | ICD-10-CM | POA: Insufficient documentation

## 2014-11-12 DIAGNOSIS — I10 Essential (primary) hypertension: Secondary | ICD-10-CM | POA: Insufficient documentation

## 2014-11-12 DIAGNOSIS — E780 Pure hypercholesterolemia: Secondary | ICD-10-CM | POA: Insufficient documentation

## 2014-11-12 DIAGNOSIS — J449 Chronic obstructive pulmonary disease, unspecified: Secondary | ICD-10-CM | POA: Insufficient documentation

## 2014-11-12 DIAGNOSIS — Z888 Allergy status to other drugs, medicaments and biological substances status: Secondary | ICD-10-CM | POA: Insufficient documentation

## 2014-11-12 DIAGNOSIS — S60451A Superficial foreign body of left index finger, initial encounter: Secondary | ICD-10-CM | POA: Insufficient documentation

## 2014-11-12 DIAGNOSIS — I2510 Atherosclerotic heart disease of native coronary artery without angina pectoris: Secondary | ICD-10-CM | POA: Diagnosis not present

## 2014-11-12 DIAGNOSIS — I4891 Unspecified atrial fibrillation: Secondary | ICD-10-CM | POA: Insufficient documentation

## 2014-11-12 DIAGNOSIS — Z886 Allergy status to analgesic agent status: Secondary | ICD-10-CM | POA: Insufficient documentation

## 2014-11-12 DIAGNOSIS — Z951 Presence of aortocoronary bypass graft: Secondary | ICD-10-CM | POA: Diagnosis not present

## 2014-11-12 DIAGNOSIS — Z85828 Personal history of other malignant neoplasm of skin: Secondary | ICD-10-CM | POA: Insufficient documentation

## 2014-11-12 HISTORY — PX: I & D EXTREMITY: SHX5045

## 2014-11-12 HISTORY — DX: Presence of spectacles and contact lenses: Z97.3

## 2014-11-12 LAB — POCT I-STAT, CHEM 8
BUN: 23 mg/dL (ref 6–23)
CALCIUM ION: 0.98 mmol/L — AB (ref 1.13–1.30)
Chloride: 106 mmol/L (ref 96–112)
Creatinine, Ser: 1.4 mg/dL — ABNORMAL HIGH (ref 0.50–1.35)
Glucose, Bld: 91 mg/dL (ref 70–99)
HCT: 48 % (ref 39.0–52.0)
Hemoglobin: 16.3 g/dL (ref 13.0–17.0)
Potassium: 4.9 mmol/L (ref 3.5–5.1)
SODIUM: 137 mmol/L (ref 135–145)
TCO2: 20 mmol/L (ref 0–100)

## 2014-11-12 SURGERY — IRRIGATION AND DEBRIDEMENT EXTREMITY
Anesthesia: General | Site: Finger | Laterality: Left

## 2014-11-12 MED ORDER — PROPOFOL 10 MG/ML IV BOLUS
INTRAVENOUS | Status: DC | PRN
  Administered 2014-11-12: 150 mg via INTRAVENOUS

## 2014-11-12 MED ORDER — MIDAZOLAM HCL 2 MG/2ML IJ SOLN
INTRAMUSCULAR | Status: AC
  Filled 2014-11-12: qty 2

## 2014-11-12 MED ORDER — EPHEDRINE SULFATE 50 MG/ML IJ SOLN
INTRAMUSCULAR | Status: DC | PRN
  Administered 2014-11-12: 10 mg via INTRAVENOUS

## 2014-11-12 MED ORDER — PHENYLEPHRINE HCL 10 MG/ML IJ SOLN
INTRAMUSCULAR | Status: DC | PRN
  Administered 2014-11-12 (×2): 40 ug via INTRAVENOUS

## 2014-11-12 MED ORDER — OXYCODONE HCL 5 MG PO TABS
5.0000 mg | ORAL_TABLET | Freq: Once | ORAL | Status: AC | PRN
  Administered 2014-11-12: 5 mg via ORAL

## 2014-11-12 MED ORDER — MIDAZOLAM HCL 2 MG/2ML IJ SOLN: 1.0000 mg | INTRAMUSCULAR | Status: DC | PRN

## 2014-11-12 MED ORDER — ONDANSETRON HCL 4 MG/2ML IJ SOLN: 4.0000 mg | Freq: Once | INTRAMUSCULAR | Status: DC | PRN

## 2014-11-12 MED ORDER — OXYCODONE HCL 5 MG PO TABS
ORAL_TABLET | ORAL | Status: AC
  Filled 2014-11-12: qty 1

## 2014-11-12 MED ORDER — CHLORHEXIDINE GLUCONATE 4 % EX LIQD: 60.0000 mL | Freq: Once | CUTANEOUS | Status: DC

## 2014-11-12 MED ORDER — BUPIVACAINE HCL (PF) 0.25 % IJ SOLN
INTRAMUSCULAR | Status: AC
  Filled 2014-11-12: qty 30

## 2014-11-12 MED ORDER — CEFAZOLIN SODIUM-DEXTROSE 2-3 GM-% IV SOLR
2.0000 g | INTRAVENOUS | Status: AC
  Administered 2014-11-12: 2 g via INTRAVENOUS

## 2014-11-12 MED ORDER — FENTANYL CITRATE 0.05 MG/ML IJ SOLN: 50.0000 ug | INTRAMUSCULAR | Status: DC | PRN

## 2014-11-12 MED ORDER — MIDAZOLAM HCL 5 MG/5ML IJ SOLN
INTRAMUSCULAR | Status: DC | PRN
  Administered 2014-11-12: 2 mg via INTRAVENOUS

## 2014-11-12 MED ORDER — GLYCOPYRROLATE 0.2 MG/ML IJ SOLN
INTRAMUSCULAR | Status: DC | PRN
  Administered 2014-11-12: 0.2 mg via INTRAVENOUS

## 2014-11-12 MED ORDER — OXYCODONE HCL 5 MG/5ML PO SOLN: 5.0000 mg | Freq: Once | ORAL | Status: AC | PRN

## 2014-11-12 MED ORDER — HYDROMORPHONE HCL 1 MG/ML IJ SOLN: 0.2500 mg | INTRAMUSCULAR | Status: DC | PRN

## 2014-11-12 MED ORDER — FENTANYL CITRATE 0.05 MG/ML IJ SOLN
INTRAMUSCULAR | Status: AC
  Filled 2014-11-12: qty 6

## 2014-11-12 MED ORDER — LACTATED RINGERS IV SOLN
INTRAVENOUS | Status: DC
  Administered 2014-11-12 (×2): via INTRAVENOUS

## 2014-11-12 MED ORDER — ONDANSETRON HCL 4 MG/2ML IJ SOLN
INTRAMUSCULAR | Status: DC | PRN
  Administered 2014-11-12: 4 mg via INTRAVENOUS

## 2014-11-12 MED ORDER — FENTANYL CITRATE 0.05 MG/ML IJ SOLN
INTRAMUSCULAR | Status: DC | PRN
  Administered 2014-11-12 (×2): 50 ug via INTRAVENOUS

## 2014-11-12 MED ORDER — BUPIVACAINE HCL (PF) 0.25 % IJ SOLN
INTRAMUSCULAR | Status: DC | PRN
  Administered 2014-11-12: 8 mL

## 2014-11-12 MED ORDER — HYDROCODONE-ACETAMINOPHEN 10-325 MG PO TABS: 1.0000 | ORAL_TABLET | Freq: Four times a day (QID) | ORAL | Status: DC | PRN

## 2014-11-12 MED ORDER — CEFAZOLIN SODIUM-DEXTROSE 2-3 GM-% IV SOLR
INTRAVENOUS | Status: AC
  Filled 2014-11-12: qty 50

## 2014-11-12 MED ORDER — DEXAMETHASONE SODIUM PHOSPHATE 10 MG/ML IJ SOLN
INTRAMUSCULAR | Status: DC | PRN
  Administered 2014-11-12: 10 mg via INTRAVENOUS

## 2014-11-12 SURGICAL SUPPLY — 48 items
BAG DECANTER FOR FLEXI CONT (MISCELLANEOUS) IMPLANT
BLADE MINI RND TIP GREEN BEAV (BLADE) IMPLANT
BLADE SURG 15 STRL LF DISP TIS (BLADE) ×1 IMPLANT
BLADE SURG 15 STRL SS (BLADE) ×3
BNDG CMPR 9X4 STRL LF SNTH (GAUZE/BANDAGES/DRESSINGS) ×1
BNDG COHESIVE 1X5 TAN STRL LF (GAUZE/BANDAGES/DRESSINGS) IMPLANT
BNDG COHESIVE 3X5 TAN STRL LF (GAUZE/BANDAGES/DRESSINGS) ×2 IMPLANT
BNDG ESMARK 4X9 LF (GAUZE/BANDAGES/DRESSINGS) ×2 IMPLANT
BNDG GAUZE ELAST 4 BULKY (GAUZE/BANDAGES/DRESSINGS) ×2 IMPLANT
CHLORAPREP W/TINT 26ML (MISCELLANEOUS) ×3 IMPLANT
CORDS BIPOLAR (ELECTRODE) ×3 IMPLANT
COVER BACK TABLE 60X90IN (DRAPES) ×3 IMPLANT
COVER MAYO STAND STRL (DRAPES) ×3 IMPLANT
CUFF TOURNIQUET SINGLE 18IN (TOURNIQUET CUFF) ×2 IMPLANT
DRAPE EXTREMITY T 121X128X90 (DRAPE) ×3 IMPLANT
DRAPE SURG 17X23 STRL (DRAPES) ×3 IMPLANT
DRSG KUZMA FLUFF (GAUZE/BANDAGES/DRESSINGS) ×1 IMPLANT
GAUZE PACKING IODOFORM 1/4X15 (GAUZE/BANDAGES/DRESSINGS) IMPLANT
GAUZE SPONGE 4X4 12PLY STRL (GAUZE/BANDAGES/DRESSINGS) ×3 IMPLANT
GAUZE XEROFORM 1X8 LF (GAUZE/BANDAGES/DRESSINGS) ×3 IMPLANT
GLOVE BIOGEL PI IND STRL 7.0 (GLOVE) IMPLANT
GLOVE BIOGEL PI IND STRL 8.5 (GLOVE) ×1 IMPLANT
GLOVE BIOGEL PI INDICATOR 7.0 (GLOVE) ×6
GLOVE BIOGEL PI INDICATOR 8.5 (GLOVE) ×2
GLOVE ECLIPSE 6.5 STRL STRAW (GLOVE) ×4 IMPLANT
GLOVE SURG ORTHO 8.0 STRL STRW (GLOVE) ×3 IMPLANT
GOWN STRL REUS W/ TWL LRG LVL3 (GOWN DISPOSABLE) ×1 IMPLANT
GOWN STRL REUS W/TWL LRG LVL3 (GOWN DISPOSABLE) ×6
GOWN STRL REUS W/TWL XL LVL3 (GOWN DISPOSABLE) ×3 IMPLANT
LOOP VESSEL MAXI BLUE (MISCELLANEOUS) IMPLANT
NDL PRECISIONGLIDE 27X1.5 (NEEDLE) IMPLANT
NEEDLE PRECISIONGLIDE 27X1.5 (NEEDLE) IMPLANT
NS IRRIG 1000ML POUR BTL (IV SOLUTION) ×3 IMPLANT
PACK BASIN DAY SURGERY FS (CUSTOM PROCEDURE TRAY) ×3 IMPLANT
PAD CAST 3X4 CTTN HI CHSV (CAST SUPPLIES) IMPLANT
PADDING CAST ABS 4INX4YD NS (CAST SUPPLIES) ×2
PADDING CAST ABS COTTON 4X4 ST (CAST SUPPLIES) ×1 IMPLANT
PADDING CAST COTTON 3X4 STRL (CAST SUPPLIES) ×3
SPLINT PLASTER CAST XFAST 3X15 (CAST SUPPLIES) IMPLANT
SPLINT PLASTER XTRA FASTSET 3X (CAST SUPPLIES) ×20
STOCKINETTE 4X48 STRL (DRAPES) ×3 IMPLANT
SWAB COLLECTION DEVICE MRSA (MISCELLANEOUS) IMPLANT
SYR BULB 3OZ (MISCELLANEOUS) ×3 IMPLANT
SYR CONTROL 10ML LL (SYRINGE) ×2 IMPLANT
TOWEL OR 17X24 6PK STRL BLUE (TOWEL DISPOSABLE) ×6 IMPLANT
TUBE ANAEROBIC SPECIMEN COL (MISCELLANEOUS) IMPLANT
TUBE FEEDING 5FR 15 INCH (TUBING) ×2 IMPLANT
UNDERPAD 30X30 INCONTINENT (UNDERPADS AND DIAPERS) ×3 IMPLANT

## 2014-11-12 NOTE — Op Note (Signed)
Tom [name], Tom [name]                  ACCOUNT NO.:  [id number]  MEDICAL RECORD NO.:  [id number]  LOCATION:                                 FACILITY:  PHYSICIAN:  Daryll Brod, M.D.            DATE OF BIRTH:  DATE OF PROCEDURE:  11/12/2014 DATE OF DISCHARGE:                              OPERATIVE REPORT   PREOPERATIVE DIAGNOSIS:  Foreign body with probable flexor sheath infection, left index finger.  POSTOPERATIVE DIAGNOSIS:  Foreign body with probable flexor sheath infection, left index finger.  OPERATION:  Incision and drainage of flexor sheath with removal of foreign bodies, left index finger.  SURGEON:  Daryll Brod, M.D.  ANESTHESIA:  General with metacarpal block.  ANESTHESIOLOGIST:  Lorrene Reid, M.D.  HISTORY:  The patient is a 64 year old male, working at home approximately 1 week ago when he got a splinter in the distal pulp of his left index finger.  He has had progressive swelling with some of cannibal signs, swelling to the proximal phalanx, only mild tenderness at the flexor A1 pulley area.  No significant pain with extension, but has been on an antibiotic since 4 days ago.  He has had 2 attempted removals of the splinter.  He is admitted now for incision and drainage of flexor sheath with removal of possible foreign bodies as detailed by findings.  He is aware that there is no guarantee with the surgery, possibility of infection; recurrence of injury to arteries, nerves, tendons; incomplete relief of symptoms, dystrophy.  In preoperative area, the patient is seen, the extremity marked by both patient and surgeon.  PROCEDURE IN DETAIL:  The patient was brought to the operating room where general anesthetic was carried out without difficulty.  He was prepped using ChloraPrep, supine position with left arm free.  The limb was exsanguinated from the wrist proximally.  Tourniquet inflated to 250 mmHg on the upper arm after a 3-minute dry time was allowed and  time-out taken, confirming the patient and procedure.  Midlateral incision was then made including the puncture wound on the pulp.  A piece of wood was immediately encountered.  This was removed.  A second piece of wood with pain on it was then removed into a deeper position down to the flexor tendon.  The flexor sheath was opened.  Irrigation catheter was passed, placed into the flexor sheath.  A separate incision was then made at the level of the A1 pulley, transverse in nature, carried down through subcutaneous tissue.  Neurovascular structures protected.  The flexor sheath was found to have fluid within it.  This was opened just proximal to the A1 pulley.  The cultures were taken for both aerobic and anaerobic cultures, and this was then copiously irrigated with 500 mL of saline until absolutely clear both proximally and distally.  The wounds were packed open.  A sterile compressive dressing and dorsal splint was applied.  On deflation of the tourniquet, all fingers immediately pinked.  He was taken to the recovery room for observation in satisfactory condition.  He will be discharged on Percocet to return on Monday.  He is on amoxicillin.  He was maintained on this.          ______________________________ Daryll Brod, M.D.     GK/MEDQ  D:  11/12/2014  T:  11/12/2014  Job:  [id number]

## 2014-11-12 NOTE — H&P (Signed)
Tom [name] is a 64 year old left hand dominant male referred by Dr. Asencion Noble for a consultation with respect to infection to his left index finger. He suffered a puncture wound on 11-04-14. He states over the weekend it increased. He saw Dr. Willey Blade on Monday which he tried opening this. He states the splinter was removed. Dr. Willey Blade placed him on Amoxicillin and attempted to open it further but did not obtain any significant purulent material. He states it has gotten slightly worse with further swelling along the pulp of his finger. He recalls no history of injury. There is no history of diabetes, thyroid problems, arthritis or gout. He complains of moderate throbbing pain with a feeling of swelling. Activity makes it worse.   PAST MEDICAL HISTORY:  He is allergic to Niacin. He is on Multag, Zetia, Lipitor, Xarelto, Norvasc, Diovan, Toprol, Protonix and Xanax. He has had cardiac bypass, aortic stent, renal stent, vascular surgery on his legs.   FAMILY MEDICAL HISTORY: Positive for diabetes, heart disease, high BP and arthritis.  SOCIAL HISTORY:  He smokes  PPD advised to quit and the reasons behind this. He does not drink. He is a Forensic scientist.  REVIEW OF SYSTEMS: Positive for glasses, high BP, heart attack, otherwise negative 14 points.  Tom [name] is an 64 y.o. male.   Chief Complaint: Infectionleft index finger HPI: see above  Past Medical History  Diagnosis Date  . Anxiety   . Renal insufficiency   . Hypertension   . Hypercholesteremia   . AAA (abdominal aortic aneurysm)   . Cancer 2013    skin cancer,   MOHS surgery  . COPD (chronic obstructive pulmonary disease)   . Myocardial infarction 1997  . Complication of anesthesia     hard to sedate  . Coronary artery disease   . GERD (gastroesophageal reflux disease)   . Atrial fibrillation   . Wears glasses     Past Surgical History  Procedure Laterality Date  . Abdominal aortic aneurysm repair  2013    EVAR  . Renal  angiogram Left Dec. 20, 2013    Left Renal angiogram and  Left Renal stent  . Renal angiogram N/A 08/09/2012    Procedure: RENAL ANGIOGRAM;  Surgeon: Elam Dutch, MD;  Location: [facility] CATH LAB;  Service: Cardiovascular;  Laterality: N/A;  . Percutaneous stent intervention Left 08/09/2012    Procedure: PERCUTANEOUS STENT INTERVENTION;  Surgeon: Elam Dutch, MD;  Location: [facility] CATH LAB;  Service: Cardiovascular;  Laterality: Left;  stent to lt renal artery  . Coronary artery bypass graft  09/2005    x2 LIMA to LAD saphenous vein to RCA  . Colonoscopy    . Aorta - bilateral femoral artery bypass graft      Family History  Problem Relation Age of Onset  . Heart disease Father   . Hypertension Father   . Hyperlipidemia Father   . Diabetes Father   . Heart disease Mother   . Hypertension Mother   . Hyperlipidemia Mother   . AAA (abdominal aortic aneurysm) Maternal Uncle   . Cancer Maternal Uncle    Social History:  reports that he has been smoking Cigarettes.  He has a 22 pack-year smoking history. He has never used smokeless tobacco. He reports that he does not drink alcohol or use illicit drugs.  Allergies:  Allergies  Allergen Reactions  . Codeine Other (See Comments)    GI upset ONLY in the cough syrup   . Niacin  Hives    No prescriptions prior to admission    No results found for this or any previous visit (from the past 48 hour(s)).  No results found.   Pertinent items are noted in HPI.  Height 5\' 10"  (1.778 m), weight 89.812 kg (198 lb).  General appearance: alert, cooperative and appears stated age Head: Normocephalic, without obvious abnormality Neck: no JVD Resp: clear to auscultation bilaterally Cardio: regular rate and rhythm, S1, S2 normal, no murmur, click, rub or gallop GI: soft, non-tender; bowel sounds normal; no masses,  no organomegaly Extremities: swelling left index finger Pulses: 2+ and symmetric Skin: Skin color, texture, turgor normal. No  rashes or lesions Neurologic: Grossly normal Incision/Wound: Puncture pulp left index  Assessment/Plan X-rays reveal no foreign body. Sensation is intact to light touch.  DIAGNOSIS: Infection left index finger, questionable involvement of flexor sheath.  In that this has not fully declared itself we will maintain him on the Amoxicillin   I&D  with  opening the flexor sheath and irrigation.   Quindon Denker R 11/12/2014, 11:04 AM

## 2014-11-12 NOTE — Transfer of Care (Signed)
Immediate Anesthesia Transfer of Care Note  Patient: Tom [name]  Procedure(s) Performed: Procedure(s): IRRIGATION AND DEBRIDEMENT LEFT INDEX FINGER (Left)  Patient Location: PACU  Anesthesia Type:General  Level of Consciousness: awake and patient cooperative  Airway & Oxygen Therapy: Patient Spontanous Breathing and Patient connected to face mask oxygen  Post-op Assessment: Report given to RN and Post -op Vital signs reviewed and stable  Post vital signs: Reviewed and stable  Last Vitals:  Filed Vitals:   11/12/14 1138  BP: 119/70  Pulse: 59  Temp: 36.5 C  Resp: 18    Complications: No apparent anesthesia complications

## 2014-11-12 NOTE — Brief Op Note (Signed)
11/12/2014  12:56 PM  PATIENT:  Tom [name]  64 y.o. male  PRE-OPERATIVE DIAGNOSIS:  INFECTION LEFT INDEX FINGER  POST-OPERATIVE DIAGNOSIS:  infection left index finger  PROCEDURE:  Procedure(s): IRRIGATION AND DEBRIDEMENT LEFT INDEX FINGER (Left)  SURGEON:  Surgeon(s) and Role:    * Daryll Brod, MD - Primary  PHYSICIAN ASSISTANT:   ASSISTANTS: none   ANESTHESIA:   local and general  EBL:  Total I/O In: 1000 [I.V.:1000] Out: -   BLOOD ADMINISTERED:none  DRAINS: none   LOCAL MEDICATIONS USED:  BUPIVICAINE   SPECIMEN:  Source of Specimen:  culture  DISPOSITION OF SPECIMEN:  PATHOLOGY  COUNTS:  YES  TOURNIQUET:   Total Tourniquet Time Documented: Upper Arm (Left) - 21 minutes Total: Upper Arm (Left) - 21 minutes   DICTATION: .Other Dictation: Dictation Number [id number]  PLAN OF CARE: Discharge to home after PACU  PATIENT DISPOSITION:  PACU - hemodynamically stable.

## 2014-11-12 NOTE — Anesthesia Postprocedure Evaluation (Signed)
  Anesthesia Post-op Note  Patient: Tom [name]  Procedure(s) Performed: Procedure(s): IRRIGATION AND DEBRIDEMENT LEFT INDEX FINGER (Left)  Patient Location: PACU  Anesthesia Type: General   Level of Consciousness: awake, alert  and oriented  Airway and Oxygen Therapy: Patient Spontanous Breathing  Post-op Pain: mild  Post-op Assessment: Post-op Vital signs reviewed  Post-op Vital Signs: Reviewed  Last Vitals:  Filed Vitals:   11/12/14 1344  BP: 143/72  Pulse: 70  Temp:   Resp: 20    Complications: No apparent anesthesia complications

## 2014-11-12 NOTE — Discharge Instructions (Addendum)

## 2014-11-12 NOTE — Op Note (Signed)
Dictation Number [id number]

## 2014-11-12 NOTE — Anesthesia Preprocedure Evaluation (Signed)
Anesthesia Evaluation  Patient identified by MRN, date of birth, ID band Patient awake    Reviewed: Allergy & Precautions, NPO status , Patient's Chart, lab work & pertinent test results  Airway Mallampati: I  TM Distance: >3 FB Neck ROM: Full    Dental  (+) Teeth Intact, Dental Advisory Given   Pulmonary COPDCurrent Smoker,  breath sounds clear to auscultation        Cardiovascular hypertension, Pt. on medications and Pt. on home beta blockers + CAD and + Past MI + dysrhythmias Atrial Fibrillation Rhythm:Regular Rate:Normal     Neuro/Psych    GI/Hepatic GERD-  Medicated and Controlled,  Endo/Other    Renal/GU      Musculoskeletal   Abdominal   Peds  Hematology   Anesthesia Other Findings   Reproductive/Obstetrics                             Anesthesia Physical Anesthesia Plan  ASA: III  Anesthesia Plan: General   Post-op Pain Management:    Induction: Intravenous  Airway Management Planned: LMA  Additional Equipment:   Intra-op Plan:   Post-operative Plan: Extubation in OR  Informed Consent: I have reviewed the patients History and Physical, chart, labs and discussed the procedure including the risks, benefits and alternatives for the proposed anesthesia with the patient or authorized representative who has indicated his/her understanding and acceptance.   Dental advisory given  Plan Discussed with: CRNA, Anesthesiologist and Surgeon  Anesthesia Plan Comments:         Anesthesia Quick Evaluation

## 2014-11-15 LAB — WOUND CULTURE
Culture: NO GROWTH
GRAM STAIN: NONE SEEN

## 2014-11-17 ENCOUNTER — Encounter (HOSPITAL_BASED_OUTPATIENT_CLINIC_OR_DEPARTMENT_OTHER): Payer: Self-pay | Admitting: Orthopedic Surgery

## 2014-11-17 LAB — ANAEROBIC CULTURE: GRAM STAIN: NONE SEEN

## 2014-12-02 ENCOUNTER — Encounter: Payer: Self-pay | Admitting: Vascular Surgery

## 2014-12-02 ENCOUNTER — Other Ambulatory Visit: Payer: Self-pay | Admitting: *Deleted

## 2014-12-02 DIAGNOSIS — Z48812 Encounter for surgical aftercare following surgery on the circulatory system: Secondary | ICD-10-CM

## 2014-12-02 DIAGNOSIS — I6523 Occlusion and stenosis of bilateral carotid arteries: Secondary | ICD-10-CM

## 2014-12-02 DIAGNOSIS — I701 Atherosclerosis of renal artery: Secondary | ICD-10-CM

## 2014-12-03 ENCOUNTER — Ambulatory Visit (INDEPENDENT_AMBULATORY_CARE_PROVIDER_SITE_OTHER)
Admission: RE | Admit: 2014-12-03 | Discharge: 2014-12-03 | Disposition: A | Discharge status: Home / Self Care | Payer: BLUE CROSS/BLUE SHIELD | Source: Ambulatory Visit | Attending: Vascular Surgery | Admitting: Vascular Surgery

## 2014-12-03 ENCOUNTER — Ambulatory Visit (HOSPITAL_COMMUNITY)
Admission: RE | Admit: 2014-12-03 | Discharge: 2014-12-03 | Disposition: A | Discharge status: Home / Self Care | Payer: BLUE CROSS/BLUE SHIELD | Source: Ambulatory Visit | Attending: Vascular Surgery | Admitting: Vascular Surgery

## 2014-12-03 ENCOUNTER — Encounter: Payer: Self-pay | Admitting: Vascular Surgery

## 2014-12-03 ENCOUNTER — Ambulatory Visit (INDEPENDENT_AMBULATORY_CARE_PROVIDER_SITE_OTHER): Payer: BLUE CROSS/BLUE SHIELD | Admitting: Vascular Surgery

## 2014-12-03 VITALS — BP 131/78 | HR 52 | Ht 70.0 in | Wt 200.9 lb

## 2014-12-03 DIAGNOSIS — I714 Abdominal aortic aneurysm, without rupture, unspecified: Secondary | ICD-10-CM

## 2014-12-03 DIAGNOSIS — Z48812 Encounter for surgical aftercare following surgery on the circulatory system: Secondary | ICD-10-CM

## 2014-12-03 DIAGNOSIS — I701 Atherosclerosis of renal artery: Secondary | ICD-10-CM | POA: Diagnosis not present

## 2014-12-03 DIAGNOSIS — I6523 Occlusion and stenosis of bilateral carotid arteries: Secondary | ICD-10-CM | POA: Diagnosis not present

## 2014-12-03 NOTE — Addendum Note (Signed)
Addended by: Mena Goes on: 12/03/2014 11:26 AM   Modules accepted: Orders

## 2014-12-03 NOTE — Progress Notes (Signed)
History of Present Illness:  Patient is a 64 y.o.  male who presents for follow-up evaluation of AAA. He underwent Gore Excluder aneurysm stent graft repair in October 2013. He has also had a left renal artery stent and has known carotid stenosis of 40%. The patient denies new abdominal or back pain.  He denies any symptoms of TIA amaurosis or stroke. He has not required any additions to his blood pressure medications although he is taking extra beta blocker when he has symptoms of his heart racing. He is now on Xarelto for atrial fibrillation. The patient's atherosclerotic risk factors remain tobacco abuse hypertension elevated cholesterol.  He has been having some episodes of tachycardia with hypertension and is currently under evaluation by Dr. Acie Fredrickson for this. Otherwise his medical problems are stable and followed by his primary care physician. He has not noted any upward trend in his blood pressure. He states his A. fib rate control has been improved with a new medication that he started recently. He retired in December and is enjoying this.    Past Medical History   Diagnosis  Date   .  Anxiety     .  Renal insufficiency     .  Hypertension     .  Hypercholesteremia     .  AAA (abdominal aortic aneurysm)     .  Cancer  2013       skin cancer,   MOHS surgery   .  COPD (chronic obstructive pulmonary disease)     .  Myocardial infarction  1997   .  Complication of anesthesia         hard to sedate   .  Coronary artery disease     .  GERD (gastroesophageal reflux disease)     .  Atrial fibrillation       Review of Systems:  Neurologic: denies symptoms of TIA, amaurosis, or stroke Cardiac:denies shortness of breath or chest pain Pulmonary: denies cough or wheeze Abdomen: denies abdominal pain nausea or vomiting    History       Social History    .   Marital Status:   Married          Spouse Name:   N/A          Number of Children:   1 s    .   Years of Education:   N/A        Occupational History    .   Not on file.       History   Social History  . Marital Status: Married    Spouse Name: N/A  . Number of Children: 1 s  . Years of Education: N/A   Occupational History  . Not on file.   Social History Main Topics  . Smoking status: Current Every Day Smoker -- 0.50 packs/day for 44 years    Types: Cigarettes  . Smokeless tobacco: Never Used     Comment: less than 1 ppd  . Alcohol Use: No  . Drug Use: No  . Sexual Activity: Yes   Other Topics Concern  . Not on file   Social History Narrative   Other Topics   Concern    .   Not on file       Social History Narrative    .   No narrative on file        Allergies    Allergen   Reactions    .  Codeine   Other (See Comments)          GI upset ONLY in the cough syrup     .   Niacin   Hives        Current Outpatient Prescriptions on File Prior to Visit  Medication Sig Dispense Refill  . alprazolam (XANAX) 2 MG tablet Take 2 mg by mouth at bedtime.    [name] Kitchen amLODipine (NORVASC) 5 MG tablet TAKE 1 TABLET (5 MG TOTAL) BY MOUTH DAILY. 30 tablet 3  . amoxicillin-clavulanate (AUGMENTIN) 875-125 MG per tablet Take 1 tablet by mouth 2 (two) times daily.    [name] Kitchen aspirin 81 MG tablet Take 1 tablet (81 mg total) by mouth daily.    [name] Kitchen atorvastatin (LIPITOR) 40 MG tablet Take 40 mg by mouth daily.      [name] Kitchen dronedarone (MULTAQ) 400 MG tablet Take 1 tablet (400 mg total) by mouth 2 (two) times daily with a meal. 90 tablet 3  . HYDROcodone-acetaminophen (NORCO) 10-325 MG per tablet Take 1 tablet by mouth every 6 (six) hours as needed. 30 tablet 0  . metoprolol (TOPROL-XL) 100 MG 24 hr tablet Take 100 mg by mouth daily.     . nitroGLYCERIN (NITROSTAT) 0.4 MG SL tablet Place 0.4 mg under the tongue every 5 (five) minutes as needed. Chest pain    . OVER THE COUNTER MEDICATION Take 1 capsule by mouth daily as needed. Store brand allergy medication.    . pantoprazole (PROTONIX) 40 MG tablet Take 40 mg by mouth daily.       . propranolol (INDERAL) 10 MG tablet Take 10 mg by mouth. As needed for palpitations    . valsartan (DIOVAN) 320 MG tablet TAKE 1 TABLET BY MOUTH DAILY. 30 tablet 1  . XARELTO 20 MG TABS tablet TAKE 1 TABLET BY MOUTH DAILY WITH SUPPER 30 tablet 11  . ZETIA 10 MG tablet TAKE 1 TABLET (10 MG TOTAL) BY MOUTH DAILY. 30 tablet 2   No current facility-administered medications on file prior to visit.       Physical Examination  Filed Vitals:   12/03/14 1048 12/03/14 1101  BP: 140/79 131/78  Pulse: 50 52  Height: 5\' 10"  (1.778 m)   Weight: 200 lb 14.4 oz (91.128 kg)   SpO2: 98%    General:  Alert and oriented, no acute distress HEENT: Normal Neck: No carotid bruit appreciated 2+ carotid pulses Pulmonary: Clear to auscultation bilaterally Cardiac: Regular Rate and Rhythm without murmur Abdomen: Soft, non-tender, non-distended, normal bowel sounds, no pulsatile mass Extremities: 2+ femoral pulses, 1+ popliteal 2+ dorsalis pedis pulses bilaterally   DATA:     flex ultrasound of the abdominal aorta was performed today. I reviewed and interpreted this study. Aneurysm diameter was 4.8 cm This is decreased from 5.7 cm preoperatively There is no  evidence of endoleak. Renal artery duplex shows possible greater than 60% stenosis left renal artery but poor visualization overall right renal artery less than 60%. Carotid duplex less than 40% bilaterally    ASSESSMENT:   Doing well status post Gore Excluder stent graft repair aneurysm.  Possible recurrent left renal artery stenosis but no significant increases in blood pressure currently. Ultrasound views were suboptimal. Minimal stenosis carotid arteries bilaterally   PLAN: Patient will return in 6 months for repeat renal artery duplex scan. If velocities are continuing to increase or more suggestion of recurrent stenosis we will consider CT angiogram or catheter-based angiogram. He will need a repeat duplex of his aortic  stent graft in one year.  His carotids have minimal stenosis today I believe we can safely follow these clinically for now.  Ruta Hinds, MD [facility] Office: [phone] Pager: [phone]  VASCULAR QUALITY INITIATIVE FOLLOW UP DATA:  Current smoker: [x  ] yes  [  ] no  Living status: [x  ]  Home  [  ] Nursing home  [  ] Homeless    MEDS:  ASA [  ] yes  [ x ] no- [x  ] medical reason pt on Xarelto for afib  [  ] non compliant  STATIN  [ x ] yes  [  ] no- [  ] medical reason  [  ] non compliant  Beta blocker [ x ] yes  [  ] no- [  ] medical reason  [  ] non compliant  ACE inhibitor [ x ] yes  [  ] no- [  ] medical reason  [  ] non compliant  P2Y12 Antagonist [  ] none  [  ] clopidogrel-Plavix  [  ] ticlopidine-Ticlid   [  ] prasugrel-Effient  [  ] ticagrelor- Brilinta    Anticoagulant [  ] None  [  ] warfarin  [ x ] rivaroxaban-Xarelto [  ] dabigatran- Pradaxa  Current Max AAA = 48 mm  Endoleak:  [ x ] no  [  ] yes- [  ] type I  [  ] type II  [  ] type III  [  ] indeterminate  Number of new interventions: 0 -   Date:      Why:  [  ] endoleak  [  ] limb occlusion   [  ] growth  [  ]  Migration   [  ] symtomatic/ rupture    Conversion to open repair: [  ] yes   [ x ] no   Date:   Other operation related to EVAR:  [  ] yes   [  x] no

## 2014-12-20 DIAGNOSIS — S60459A Superficial foreign body of unspecified finger, initial encounter: Secondary | ICD-10-CM

## 2014-12-20 HISTORY — DX: Superficial foreign body of unspecified finger, initial encounter: S60.459A

## 2014-12-21 ENCOUNTER — Other Ambulatory Visit: Payer: Self-pay | Admitting: Orthopedic Surgery

## 2014-12-21 ENCOUNTER — Encounter (HOSPITAL_BASED_OUTPATIENT_CLINIC_OR_DEPARTMENT_OTHER): Payer: Self-pay | Admitting: *Deleted

## 2014-12-22 ENCOUNTER — Ambulatory Visit (HOSPITAL_BASED_OUTPATIENT_CLINIC_OR_DEPARTMENT_OTHER)
Admission: RE | Admit: 2014-12-22 | Discharge: 2014-12-22 | Disposition: A | Discharge status: Home / Self Care | Payer: BLUE CROSS/BLUE SHIELD | Source: Ambulatory Visit | Attending: Orthopedic Surgery | Admitting: Orthopedic Surgery

## 2014-12-22 ENCOUNTER — Ambulatory Visit (HOSPITAL_BASED_OUTPATIENT_CLINIC_OR_DEPARTMENT_OTHER): Payer: BLUE CROSS/BLUE SHIELD | Admitting: Certified Registered"

## 2014-12-22 ENCOUNTER — Encounter (HOSPITAL_BASED_OUTPATIENT_CLINIC_OR_DEPARTMENT_OTHER): Payer: Self-pay | Admitting: Orthopedic Surgery

## 2014-12-22 ENCOUNTER — Encounter (HOSPITAL_BASED_OUTPATIENT_CLINIC_OR_DEPARTMENT_OTHER)
Admission: RE | Disposition: A | Discharge status: Home / Self Care | Payer: Self-pay | Source: Ambulatory Visit | Attending: Orthopedic Surgery

## 2014-12-22 DIAGNOSIS — K219 Gastro-esophageal reflux disease without esophagitis: Secondary | ICD-10-CM | POA: Diagnosis not present

## 2014-12-22 DIAGNOSIS — I4891 Unspecified atrial fibrillation: Secondary | ICD-10-CM | POA: Diagnosis not present

## 2014-12-22 DIAGNOSIS — Z951 Presence of aortocoronary bypass graft: Secondary | ICD-10-CM | POA: Insufficient documentation

## 2014-12-22 DIAGNOSIS — Z7982 Long term (current) use of aspirin: Secondary | ICD-10-CM | POA: Diagnosis not present

## 2014-12-22 DIAGNOSIS — I2510 Atherosclerotic heart disease of native coronary artery without angina pectoris: Secondary | ICD-10-CM | POA: Insufficient documentation

## 2014-12-22 DIAGNOSIS — I714 Abdominal aortic aneurysm, without rupture: Secondary | ICD-10-CM | POA: Insufficient documentation

## 2014-12-22 DIAGNOSIS — Z955 Presence of coronary angioplasty implant and graft: Secondary | ICD-10-CM | POA: Diagnosis not present

## 2014-12-22 DIAGNOSIS — I252 Old myocardial infarction: Secondary | ICD-10-CM | POA: Diagnosis not present

## 2014-12-22 DIAGNOSIS — F17210 Nicotine dependence, cigarettes, uncomplicated: Secondary | ICD-10-CM | POA: Insufficient documentation

## 2014-12-22 DIAGNOSIS — Z886 Allergy status to analgesic agent status: Secondary | ICD-10-CM | POA: Insufficient documentation

## 2014-12-22 DIAGNOSIS — E780 Pure hypercholesterolemia: Secondary | ICD-10-CM | POA: Diagnosis not present

## 2014-12-22 DIAGNOSIS — I739 Peripheral vascular disease, unspecified: Secondary | ICD-10-CM | POA: Diagnosis not present

## 2014-12-22 DIAGNOSIS — I701 Atherosclerosis of renal artery: Secondary | ICD-10-CM | POA: Diagnosis not present

## 2014-12-22 DIAGNOSIS — J449 Chronic obstructive pulmonary disease, unspecified: Secondary | ICD-10-CM | POA: Insufficient documentation

## 2014-12-22 DIAGNOSIS — M65142 Other infective (teno)synovitis, left hand: Secondary | ICD-10-CM | POA: Insufficient documentation

## 2014-12-22 DIAGNOSIS — I10 Essential (primary) hypertension: Secondary | ICD-10-CM | POA: Diagnosis not present

## 2014-12-22 HISTORY — PX: INCISION AND DRAINAGE ABSCESS: SHX5864

## 2014-12-22 SURGERY — INCISION AND DRAINAGE, ABSCESS
Anesthesia: Regional | Site: Finger | Laterality: Left

## 2014-12-22 MED ORDER — OXYCODONE HCL 5 MG PO TABS: 5.0000 mg | ORAL_TABLET | Freq: Once | ORAL | Status: DC | PRN

## 2014-12-22 MED ORDER — HYDROMORPHONE HCL 1 MG/ML IJ SOLN: 0.2500 mg | INTRAMUSCULAR | Status: DC | PRN

## 2014-12-22 MED ORDER — BUPIVACAINE HCL (PF) 0.25 % IJ SOLN
INTRAMUSCULAR | Status: DC | PRN
  Administered 2014-12-22: 5 mL

## 2014-12-22 MED ORDER — MIDAZOLAM HCL 2 MG/2ML IJ SOLN
INTRAMUSCULAR | Status: AC
  Filled 2014-12-22: qty 2

## 2014-12-22 MED ORDER — PROMETHAZINE HCL 25 MG/ML IJ SOLN
6.2500 mg | INTRAMUSCULAR | Status: DC | PRN
Start: 2014-12-22 — End: 2014-12-22

## 2014-12-22 MED ORDER — MEPERIDINE HCL 25 MG/ML IJ SOLN: 6.2500 mg | INTRAMUSCULAR | Status: DC | PRN

## 2014-12-22 MED ORDER — MIDAZOLAM HCL 5 MG/5ML IJ SOLN
INTRAMUSCULAR | Status: DC | PRN
  Administered 2014-12-22: 2 mg via INTRAVENOUS

## 2014-12-22 MED ORDER — CHLORHEXIDINE GLUCONATE 4 % EX LIQD: 60.0000 mL | Freq: Once | CUTANEOUS | Status: DC

## 2014-12-22 MED ORDER — LACTATED RINGERS IV SOLN
INTRAVENOUS | Status: DC
  Administered 2014-12-22: 12:00:00 via INTRAVENOUS

## 2014-12-22 MED ORDER — SULFAMETHOXAZOLE-TRIMETHOPRIM 400-80 MG PO TABS: 1.0000 | ORAL_TABLET | Freq: Two times a day (BID) | ORAL | Status: DC

## 2014-12-22 MED ORDER — FENTANYL CITRATE (PF) 100 MCG/2ML IJ SOLN
INTRAMUSCULAR | Status: AC
  Filled 2014-12-22: qty 4

## 2014-12-22 MED ORDER — FENTANYL CITRATE (PF) 100 MCG/2ML IJ SOLN
INTRAMUSCULAR | Status: DC | PRN
  Administered 2014-12-22: 100 ug via INTRAVENOUS

## 2014-12-22 MED ORDER — OXYCODONE HCL 5 MG/5ML PO SOLN: 5.0000 mg | Freq: Once | ORAL | Status: DC | PRN

## 2014-12-22 MED ORDER — PROPOFOL INFUSION 10 MG/ML OPTIME
INTRAVENOUS | Status: DC | PRN
  Administered 2014-12-22: 50 ug/kg/min via INTRAVENOUS

## 2014-12-22 MED ORDER — HYDROCODONE-ACETAMINOPHEN 5-325 MG PO TABS: 1.0000 | ORAL_TABLET | Freq: Four times a day (QID) | ORAL | Status: DC | PRN

## 2014-12-22 MED ORDER — DEXAMETHASONE SODIUM PHOSPHATE 4 MG/ML IJ SOLN
INTRAMUSCULAR | Status: DC | PRN
  Administered 2014-12-22: 10 mg via INTRAVENOUS

## 2014-12-22 MED ORDER — CEFAZOLIN SODIUM-DEXTROSE 2-3 GM-% IV SOLR
INTRAVENOUS | Status: DC | PRN
  Administered 2014-12-22: 2 g via INTRAVENOUS

## 2014-12-22 MED ORDER — CEFAZOLIN SODIUM-DEXTROSE 2-3 GM-% IV SOLR
INTRAVENOUS | Status: AC
  Filled 2014-12-22: qty 50

## 2014-12-22 SURGICAL SUPPLY — 51 items
BAG DECANTER FOR FLEXI CONT (MISCELLANEOUS) IMPLANT
BLADE MINI RND TIP GREEN BEAV (BLADE) ×2 IMPLANT
BLADE SURG 15 STRL LF DISP TIS (BLADE) ×1 IMPLANT
BLADE SURG 15 STRL SS (BLADE) ×3
BNDG CMPR 9X4 STRL LF SNTH (GAUZE/BANDAGES/DRESSINGS)
BNDG COHESIVE 1X5 TAN STRL LF (GAUZE/BANDAGES/DRESSINGS) IMPLANT
BNDG COHESIVE 3X5 TAN STRL LF (GAUZE/BANDAGES/DRESSINGS) ×2 IMPLANT
BNDG ESMARK 4X9 LF (GAUZE/BANDAGES/DRESSINGS) IMPLANT
BNDG GAUZE ELAST 4 BULKY (GAUZE/BANDAGES/DRESSINGS) ×2 IMPLANT
CHLORAPREP W/TINT 26ML (MISCELLANEOUS) ×3 IMPLANT
CORDS BIPOLAR (ELECTRODE) ×3 IMPLANT
COVER BACK TABLE 60X90IN (DRAPES) ×3 IMPLANT
COVER MAYO STAND STRL (DRAPES) ×3 IMPLANT
CUFF TOURNIQUET SINGLE 18IN (TOURNIQUET CUFF) ×2 IMPLANT
DRAPE EXTREMITY T 121X128X90 (DRAPE) ×3 IMPLANT
DRAPE SURG 17X23 STRL (DRAPES) ×3 IMPLANT
DRSG KUZMA FLUFF (GAUZE/BANDAGES/DRESSINGS) ×1 IMPLANT
GAUZE PACKING IODOFORM 1/4X15 (GAUZE/BANDAGES/DRESSINGS) IMPLANT
GAUZE SPONGE 4X4 12PLY STRL (GAUZE/BANDAGES/DRESSINGS) ×3 IMPLANT
GAUZE XEROFORM 1X8 LF (GAUZE/BANDAGES/DRESSINGS) ×3 IMPLANT
GLOVE BIO SURGEON STRL SZ7.5 (GLOVE) ×2 IMPLANT
GLOVE BIOGEL PI IND STRL 7.0 (GLOVE) IMPLANT
GLOVE BIOGEL PI IND STRL 8 (GLOVE) IMPLANT
GLOVE BIOGEL PI IND STRL 8.5 (GLOVE) ×1 IMPLANT
GLOVE BIOGEL PI INDICATOR 7.0 (GLOVE) ×4
GLOVE BIOGEL PI INDICATOR 8 (GLOVE) ×2
GLOVE BIOGEL PI INDICATOR 8.5 (GLOVE) ×2
GLOVE ECLIPSE 6.5 STRL STRAW (GLOVE) ×2 IMPLANT
GLOVE SURG ORTHO 8.0 STRL STRW (GLOVE) ×3 IMPLANT
GOWN STRL REUS W/ TWL LRG LVL3 (GOWN DISPOSABLE) ×1 IMPLANT
GOWN STRL REUS W/TWL LRG LVL3 (GOWN DISPOSABLE) ×3
GOWN STRL REUS W/TWL XL LVL3 (GOWN DISPOSABLE) ×5 IMPLANT
LOOP VESSEL MAXI BLUE (MISCELLANEOUS) IMPLANT
NDL PRECISIONGLIDE 27X1.5 (NEEDLE) IMPLANT
NEEDLE PRECISIONGLIDE 27X1.5 (NEEDLE) ×3 IMPLANT
NS IRRIG 1000ML POUR BTL (IV SOLUTION) ×3 IMPLANT
PACK BASIN DAY SURGERY FS (CUSTOM PROCEDURE TRAY) ×3 IMPLANT
PAD CAST 3X4 CTTN HI CHSV (CAST SUPPLIES) IMPLANT
PADDING CAST ABS 4INX4YD NS (CAST SUPPLIES)
PADDING CAST ABS COTTON 4X4 ST (CAST SUPPLIES) ×1 IMPLANT
PADDING CAST COTTON 3X4 STRL (CAST SUPPLIES) ×3
SPLINT PLASTER CAST XFAST 3X15 (CAST SUPPLIES) IMPLANT
SPLINT PLASTER XTRA FASTSET 3X (CAST SUPPLIES) ×20
STOCKINETTE 4X48 STRL (DRAPES) ×3 IMPLANT
SWAB COLLECTION DEVICE MRSA (MISCELLANEOUS) ×2 IMPLANT
SYR BULB 3OZ (MISCELLANEOUS) ×3 IMPLANT
SYR CONTROL 10ML LL (SYRINGE) ×2 IMPLANT
TOWEL OR 17X24 6PK STRL BLUE (TOWEL DISPOSABLE) ×3 IMPLANT
TUBE ANAEROBIC SPECIMEN COL (MISCELLANEOUS) ×2 IMPLANT
TUBE FEEDING 5FR 15 INCH (TUBING) ×2 IMPLANT
UNDERPAD 30X30 (UNDERPADS AND DIAPERS) ×1 IMPLANT

## 2014-12-22 NOTE — H&P (Signed)
Tom [name] is a 64 year old left hand dominant male referred by Dr. Asencion Noble for a consultation with respect to infection to his left index finger. He suffered a puncture wound on 11-04-14. He states over the weekend it increased. He saw Dr. Willey Blade on Monday which he tried opening this. He states the splinter was removed. Dr. Willey Blade placed him on Amoxicillin and attempted to open it further but did not obtain any significant purulent material. He states it has gotten slightly worse with further swelling along the pulp of his finger. He recalls no history of injury. There is no history of diabetes, thyroid problems, arthritis or gout. He complains of moderate throbbing pain with a feeling of swelling. Activity makes it worse.He has undergone I&D of the flexor sheath.  He has had an apparent accumulation of fluid along his flexor tendon sheath.  He does not have good pull through, this is all very distal.  Apparently it has opened at one point in the past.    PAST MEDICAL HISTORY:  He is allergic to Niacin. He is on Multag, Zetia, Lipitor, Xarelto, Norvasc, Diovan, Toprol, Protonix and Xanax. He has had cardiac bypass, aortic stent, renal stent, vascular surgery on his legs.   FAMILY MEDICAL HISTORY: Positive for diabetes, heart disease, high BP and arthritis.  SOCIAL HISTORY:  He smokes  PPD advised to quit and the reasons behind this. He does not drink. He is a Forensic scientist.  REVIEW OF SYSTEMS: Positive for glasses, high BP, heart attack, otherwise negative 14 points.  Tom [name] is an 64 y.o. male.   Chief Complaint: infection left index finger HPI: see above  Past Medical History  Diagnosis Date  . Anxiety   . Renal insufficiency   . Hypertension   . Hypercholesteremia   . AAA (abdominal aortic aneurysm)   . Cancer 2013    skin cancer,   MOHS surgery  . COPD (chronic obstructive pulmonary disease)   . Myocardial infarction 1997  . Complication of anesthesia     hard to sedate  .  Coronary artery disease   . GERD (gastroesophageal reflux disease)   . Atrial fibrillation   . Wears glasses     Past Surgical History  Procedure Laterality Date  . Abdominal aortic aneurysm repair  2013    EVAR  . Renal angiogram Left Dec. 20, 2013    Left Renal angiogram and  Left Renal stent  . Renal angiogram N/A 08/09/2012    Procedure: RENAL ANGIOGRAM;  Surgeon: Elam Dutch, MD;  Location: [facility] CATH LAB;  Service: Cardiovascular;  Laterality: N/A;  . Percutaneous stent intervention Left 08/09/2012    Procedure: PERCUTANEOUS STENT INTERVENTION;  Surgeon: Elam Dutch, MD;  Location: [facility] CATH LAB;  Service: Cardiovascular;  Laterality: Left;  stent to lt renal artery  . Coronary artery bypass graft  09/2005    x2 LIMA to LAD saphenous vein to RCA  . Colonoscopy    . Aorta - bilateral femoral artery bypass graft    . I&d extremity Left 11/12/2014    Procedure: IRRIGATION AND DEBRIDEMENT LEFT INDEX FINGER;  Surgeon: Daryll Brod, MD;  Location: [facility];  Service: Orthopedics;  Laterality: Left;    Family History  Problem Relation Age of Onset  . Heart disease Father   . Hypertension Father   . Hyperlipidemia Father   . Diabetes Father   . Heart attack Father   . Heart disease Mother   . Hypertension Mother   .  Hyperlipidemia Mother   . AAA (abdominal aortic aneurysm) Maternal Uncle   . Cancer Maternal Uncle   . Hyperlipidemia Sister    Social History:  reports that he has been smoking Cigarettes.  He has a 22 pack-year smoking history. He has never used smokeless tobacco. He reports that he does not drink alcohol or use illicit drugs.  Allergies:  Allergies  Allergen Reactions  . Codeine Other (See Comments)    GI upset ONLY in the cough syrup   . Niacin Hives    No prescriptions prior to admission    No results found for this or any previous visit (from the past 48 hour(s)).  No results found.   Pertinent items are noted in HPI.  Height  5\' 10"  (1.778 m), weight 90.719 kg (200 lb).  General appearance: alert, cooperative and appears stated age Head: Normocephalic, without obvious abnormality Neck: no JVD Resp: clear to auscultation bilaterally Cardio: regular rate and rhythm, S1, S2 normal, no murmur, click, rub or gallop GI: soft, non-tender; bowel sounds normal; no masses,  no organomegaly Extremities: swelling left index finger Pulses: 2+ and symmetric Skin: Skin color, texture, turgor normal. No rashes or lesions Neurologic: Grossly normal Incision/Wound: healed  Assessment/Plan Abcess index finger left hand we would recommend I&D of this with repeat culture in that he did not grow anything out in his original cultures.  He is not complaining of a great deal of pain or discomfort at the present time.  This will be scheduled tomorrow as an outpatient under regional anesthesia to his index finger, left hand.  Pam Vanalstine R 12/22/2014, 11:09 AM

## 2014-12-22 NOTE — Anesthesia Procedure Notes (Signed)
Anesthesia Regional Block:  Bier block (IV Regional)  Pre-Anesthetic Checklist: ,, timeout performed, Correct Patient, Correct Site, Correct Laterality, Correct Procedure,, site marked, surgical consent,, at surgeon's request Needles:  Injection technique: Single-shot  Needle Type: Other      Needle Gauge: 20 and 20 G    Additional Needles: Bier block (IV Regional) Narrative:  Start time: 12/22/2014 1:04 PM End time: 12/22/2014 1:05 PM Injection made incrementally with aspirations every 35 mL.  Performed by: Personally   Additional Notes: Esmark wrap, torq inflated, neg pulse, injected 35 ml pres free 0.5% lidocaine. Pt tol well, no neg symptoms.

## 2014-12-22 NOTE — Discharge Instructions (Addendum)

## 2014-12-22 NOTE — Anesthesia Preprocedure Evaluation (Signed)
Anesthesia Evaluation  Patient identified by MRN, date of birth, ID band Patient awake    Reviewed: Allergy & Precautions, NPO status , Patient's Chart, lab work & pertinent test results  Airway Mallampati: I  TM Distance: >3 FB Neck ROM: Full    Dental  (+) Teeth Intact, Dental Advisory Given   Pulmonary COPDCurrent Smoker,  breath sounds clear to auscultation        Cardiovascular hypertension, Pt. on medications and Pt. on home beta blockers + CAD, + Past MI and + Peripheral Vascular Disease + dysrhythmias Atrial Fibrillation Rhythm:Regular Rate:Normal     Neuro/Psych    GI/Hepatic GERD-  Medicated and Controlled,  Endo/Other    Renal/GU Renal InsufficiencyRenal disease     Musculoskeletal   Abdominal   Peds  Hematology   Anesthesia Other Findings   Reproductive/Obstetrics                             Anesthesia Physical  Anesthesia Plan  ASA: III  Anesthesia Plan: Regional   Post-op Pain Management:    Induction: Intravenous  Airway Management Planned:   Additional Equipment:   Intra-op Plan:   Post-operative Plan:   Informed Consent: I have reviewed the patients History and Physical, chart, labs and discussed the procedure including the risks, benefits and alternatives for the proposed anesthesia with the patient or authorized representative who has indicated his/her understanding and acceptance.   Dental advisory given  Plan Discussed with: CRNA  Anesthesia Plan Comments:         Anesthesia Quick Evaluation

## 2014-12-22 NOTE — Brief Op Note (Signed)
12/22/2014  1:32 PM  PATIENT:  Tom [name]  64 y.o. male  PRE-OPERATIVE DIAGNOSIS:  abscess left index finger  POST-OPERATIVE DIAGNOSIS:  abscess left index finger  PROCEDURE:  Procedure(s): INCISION AND DRAINAGE ABSCESS LEFT INDEX FINGER (Left)  SURGEON:  Surgeon(s) and Role:    * Daryll Brod, MD - Primary    * Leanora Cover, MD - Assisting  PHYSICIAN ASSISTANT:   ASSISTANTS: K Davette Nugent,MD   ANESTHESIA:   local and regional  EBL:  Total I/O In: 500 [I.V.:500] Out: -   BLOOD ADMINISTERED:none  DRAINS: Penrose drain in the index left   LOCAL MEDICATIONS USED:  BUPIVICAINE   SPECIMEN:  Source of Specimen:  cultures  DISPOSITION OF SPECIMEN:  PATHOLOGY  COUNTS:  YES  TOURNIQUET:   Total Tourniquet Time Documented: Forearm (Left) - 21 minutes Total: Forearm (Left) - 21 minutes   DICTATION: .Other Dictation: Dictation Number [id number]  PLAN OF CARE: Discharge to home after PACU  PATIENT DISPOSITION:  PACU - hemodynamically stable.

## 2014-12-22 NOTE — Transfer of Care (Signed)
Immediate Anesthesia Transfer of Care Note  Patient: Tom [name]  Procedure(s) Performed: Procedure(s): INCISION AND DRAINAGE ABSCESS LEFT INDEX FINGER (Left)  Patient Location: PACU  Anesthesia Type:MAC and Bier block  Level of Consciousness: awake, alert  and oriented  Airway & Oxygen Therapy: Patient Spontanous Breathing and Patient connected to face mask oxygen  Post-op Assessment: Report given to RN and Post -op Vital signs reviewed and stable  Post vital signs: Reviewed and stable  Last Vitals:  Filed Vitals:   12/22/14 1201  Pulse: 48  Temp: 36.6 C  Resp: 16    Complications: No apparent anesthesia complications

## 2014-12-22 NOTE — Op Note (Signed)
Dictation Number [id number]

## 2014-12-22 NOTE — Anesthesia Postprocedure Evaluation (Signed)
Anesthesia Post Note  Patient: Tom [name]  Procedure(s) Performed: Procedure(s) (LRB): INCISION AND DRAINAGE ABSCESS LEFT INDEX FINGER (Left)  Anesthesia type: MAC + IV regional  Patient location: PACU  Post pain: Pain level controlled  Post assessment: Post-op Vital signs reviewed  Last Vitals: BP 139/76 mmHg  Pulse 53  Temp(Src) 36.4 C (Oral)  Resp 10  Ht 5\' 10"  (1.778 m)  Wt 200 lb (90.719 kg)  BMI 28.70 kg/m2  SpO2 97%  Post vital signs: Reviewed  Level of consciousness: awake  Complications: No apparent anesthesia complications

## 2014-12-23 NOTE — Op Note (Signed)
NAMEJONTAE, Tom [name]                  ACCOUNT NO.:  [id number]  MEDICAL RECORD NO.:  [id number]  LOCATION:                                 FACILITY:  PHYSICIAN:  Daryll Brod, M.D.       DATE OF BIRTH:  [birth year]  DATE OF PROCEDURE:  12/22/2014 DATE OF DISCHARGE:                              OPERATIVE REPORT   PREOPERATIVE DIAGNOSIS:  Recurrent flexor sheath infection, left index finger.  POSTOPERATIVE DIAGNOSIS:  Recurrent flexor sheath infection, left index finger.  OPERATION:  Incision and drainage, flexor sheath left index finger.  SURGEON:  Daryll Brod, MD  ASSISTANT:  Leanora Cover, MD  ANESTHESIA:  Forearm IV regional with sedation, local infiltration.  ANESTHESIOLOGIST:  Nelda Severe. Tobias Alexander, M.D.  HISTORY:  The patient is a 64 year old male who suffered a foreign body injury to his left index finger with a splinter.  He subsequently developed flexor sheath infection, this was incised and drained with removal of foreign body.  He has done well, but came into the office yesterday with a pointing area.  He was admitted for incision and drainage.  The cultures and that his original cultures showed no growth. He is aware of the possibility of 2-0 incisions rather than a single depending on findings.  He is aware of potential stiffness to the finger, loss of mobility.  Pre, peri, and postoperative course have been discussed.  PROCEDURE IN DETAIL:  The patient was brought to the operating room after being marked in the preoperative area.  Antibiotic was not given to allow cultures to be taken.  He was prepped and draped with ChloraPrep, supine position with the left arm free.  A 3-minute dry time was allowed.  Time-out taken, confirming the patient and procedure.  A transverse incision was made over the pointing area of the distal phalanx.  Purulent material was immediately encountered.  This was cultured for aerobic, anaerobic, fungal and mycobacterial infections. Cultures were  sent.  A separate incision was then made over the A1 pulley area of the index finger, carried down through subcutaneous tissue protecting neurovascular structures.  The sheath was opened proximally.  No purulent fluid was immediately obtained.  An infant feeding tube was then passed distally and irrigated until copious irrigation flowed from the proximal wound.  This was copiously irrigated with saline.  The infant feeding tube was then brought all the way through the flexor sheath palmarly to the palm and brought out.  This was left in position.  A sterile compressive dressing, dorsal splint was applied.  On deflation of the tourniquet, all fingers were immediately pinked.  He was taken to the recovery room for observation in satisfactory condition.  He will be discharged home to return to the [facility] tomorrow for initiation of therapy whirlpool repacking.  He will be discharged on Norco, Septra DS.          ______________________________ Daryll Brod, M.D.     GK/MEDQ  D:  12/22/2014  T:  12/23/2014  Job:  [id number]

## 2014-12-24 ENCOUNTER — Encounter (HOSPITAL_BASED_OUTPATIENT_CLINIC_OR_DEPARTMENT_OTHER): Payer: Self-pay | Admitting: Orthopedic Surgery

## 2014-12-24 NOTE — Addendum Note (Signed)
Addendum  created 12/24/14 1051 by Tawni Millers, CRNA   Modules edited: Charges VN

## 2014-12-27 LAB — ANAEROBIC CULTURE

## 2014-12-27 LAB — CULTURE, ROUTINE-ABSCESS

## 2014-12-28 LAB — POCT I-STAT, CHEM 8
BUN: 19 mg/dL (ref 6–20)
CHLORIDE: 105 mmol/L (ref 101–111)
CREATININE: 1.4 mg/dL — AB (ref 0.61–1.24)
Calcium, Ion: 1.2 mmol/L (ref 1.13–1.30)
Glucose, Bld: 99 mg/dL (ref 70–99)
HCT: 45 % (ref 39.0–52.0)
Hemoglobin: 15.3 g/dL (ref 13.0–17.0)
POTASSIUM: 4.5 mmol/L (ref 3.5–5.1)
Sodium: 138 mmol/L (ref 135–145)
TCO2: 20 mmol/L (ref 0–100)

## 2014-12-29 ENCOUNTER — Other Ambulatory Visit: Payer: Self-pay | Admitting: *Deleted

## 2014-12-29 DIAGNOSIS — I480 Paroxysmal atrial fibrillation: Secondary | ICD-10-CM

## 2014-12-29 LAB — POCT I-STAT, CHEM 8
BUN: 30 mg/dL — ABNORMAL HIGH (ref 6–20)
CALCIUM ION: 1.1 mmol/L — AB (ref 1.13–1.30)
CHLORIDE: 105 mmol/L (ref 101–111)
Creatinine, Ser: 1.5 mg/dL — ABNORMAL HIGH (ref 0.61–1.24)
Glucose, Bld: 92 mg/dL (ref 70–99)
HCT: 48 % (ref 39.0–52.0)
Hemoglobin: 16.3 g/dL (ref 13.0–17.0)
Potassium: 8 mmol/L (ref 3.5–5.1)
Sodium: 134 mmol/L — ABNORMAL LOW (ref 135–145)
TCO2: 24 mmol/L (ref 0–100)

## 2014-12-29 MED ORDER — DRONEDARONE HCL 400 MG PO TABS: 400.0000 mg | ORAL_TABLET | Freq: Two times a day (BID) | ORAL | Status: DC

## 2015-01-01 ENCOUNTER — Other Ambulatory Visit: Payer: Self-pay | Admitting: Orthopedic Surgery

## 2015-01-01 DIAGNOSIS — M79645 Pain in left finger(s): Secondary | ICD-10-CM

## 2015-01-04 ENCOUNTER — Ambulatory Visit
Admission: RE | Admit: 2015-01-04 | Discharge: 2015-01-04 | Disposition: A | Discharge status: Home / Self Care | Payer: BLUE CROSS/BLUE SHIELD | Source: Ambulatory Visit | Attending: Orthopedic Surgery | Admitting: Orthopedic Surgery

## 2015-01-04 DIAGNOSIS — M79645 Pain in left finger(s): Secondary | ICD-10-CM

## 2015-01-05 ENCOUNTER — Other Ambulatory Visit: Payer: Self-pay | Admitting: Internal Medicine

## 2015-01-07 ENCOUNTER — Other Ambulatory Visit: Payer: Self-pay | Admitting: Orthopedic Surgery

## 2015-01-07 ENCOUNTER — Encounter (HOSPITAL_BASED_OUTPATIENT_CLINIC_OR_DEPARTMENT_OTHER): Payer: Self-pay | Admitting: *Deleted

## 2015-01-07 NOTE — Progress Notes (Signed)
   01/07/15 1153  OBSTRUCTIVE SLEEP APNEA  Have you ever been diagnosed with sleep apnea through a sleep study? No  Do you snore loudly (loud enough to be heard through closed doors)?  1  Do you often feel tired, fatigued, or sleepy during the daytime? 0  Has anyone observed you stop breathing during your sleep? 1  Do you have, or are you being treated for high blood pressure? 1  BMI more than 35 kg/m2? 0  Age over 63 years old? 1  Gender: 1  Obstructive Sleep Apnea Score 5

## 2015-01-08 ENCOUNTER — Ambulatory Visit (HOSPITAL_BASED_OUTPATIENT_CLINIC_OR_DEPARTMENT_OTHER): Payer: BLUE CROSS/BLUE SHIELD | Admitting: Certified Registered"

## 2015-01-08 ENCOUNTER — Encounter (HOSPITAL_BASED_OUTPATIENT_CLINIC_OR_DEPARTMENT_OTHER)
Admission: RE | Disposition: A | Discharge status: Home / Self Care | Payer: Self-pay | Source: Ambulatory Visit | Attending: Orthopedic Surgery

## 2015-01-08 ENCOUNTER — Ambulatory Visit (HOSPITAL_BASED_OUTPATIENT_CLINIC_OR_DEPARTMENT_OTHER)
Admission: RE | Admit: 2015-01-08 | Discharge: 2015-01-08 | Disposition: A | Discharge status: Home / Self Care | Payer: BLUE CROSS/BLUE SHIELD | Source: Ambulatory Visit | Attending: Orthopedic Surgery | Admitting: Orthopedic Surgery

## 2015-01-08 ENCOUNTER — Encounter (HOSPITAL_BASED_OUTPATIENT_CLINIC_OR_DEPARTMENT_OTHER): Payer: Self-pay | Admitting: Orthopedic Surgery

## 2015-01-08 DIAGNOSIS — I10 Essential (primary) hypertension: Secondary | ICD-10-CM | POA: Insufficient documentation

## 2015-01-08 DIAGNOSIS — Y929 Unspecified place or not applicable: Secondary | ICD-10-CM | POA: Diagnosis not present

## 2015-01-08 DIAGNOSIS — F17210 Nicotine dependence, cigarettes, uncomplicated: Secondary | ICD-10-CM | POA: Diagnosis not present

## 2015-01-08 DIAGNOSIS — I252 Old myocardial infarction: Secondary | ICD-10-CM | POA: Insufficient documentation

## 2015-01-08 DIAGNOSIS — I4891 Unspecified atrial fibrillation: Secondary | ICD-10-CM | POA: Insufficient documentation

## 2015-01-08 DIAGNOSIS — I739 Peripheral vascular disease, unspecified: Secondary | ICD-10-CM | POA: Insufficient documentation

## 2015-01-08 DIAGNOSIS — I2510 Atherosclerotic heart disease of native coronary artery without angina pectoris: Secondary | ICD-10-CM | POA: Insufficient documentation

## 2015-01-08 DIAGNOSIS — X58XXXA Exposure to other specified factors, initial encounter: Secondary | ICD-10-CM | POA: Insufficient documentation

## 2015-01-08 DIAGNOSIS — M795 Residual foreign body in soft tissue: Secondary | ICD-10-CM | POA: Diagnosis not present

## 2015-01-08 DIAGNOSIS — S60451A Superficial foreign body of left index finger, initial encounter: Secondary | ICD-10-CM | POA: Diagnosis present

## 2015-01-08 HISTORY — PX: FOREIGN BODY REMOVAL: SHX962

## 2015-01-08 HISTORY — DX: Superficial foreign body of unspecified finger, initial encounter: S60.459A

## 2015-01-08 HISTORY — DX: Family history of other specified conditions: Z84.89

## 2015-01-08 HISTORY — DX: Paroxysmal atrial fibrillation: I48.0

## 2015-01-08 HISTORY — DX: Dental restoration status: Z98.811

## 2015-01-08 HISTORY — DX: Presence of dental prosthetic device (complete) (partial): Z97.2

## 2015-01-08 SURGERY — FOREIGN BODY REMOVAL ADULT
Anesthesia: General | Site: Finger | Laterality: Left

## 2015-01-08 MED ORDER — BUPIVACAINE HCL (PF) 0.25 % IJ SOLN
INTRAMUSCULAR | Status: DC | PRN
  Administered 2015-01-08: 5 mL

## 2015-01-08 MED ORDER — LACTATED RINGERS IV SOLN
INTRAVENOUS | Status: DC
  Administered 2015-01-08: 14:00:00 via INTRAVENOUS

## 2015-01-08 MED ORDER — CEFAZOLIN SODIUM-DEXTROSE 2-3 GM-% IV SOLR
INTRAVENOUS | Status: AC
  Filled 2015-01-08: qty 50

## 2015-01-08 MED ORDER — PROPOFOL 10 MG/ML IV BOLUS
INTRAVENOUS | Status: DC | PRN
  Administered 2015-01-08: 150 mg via INTRAVENOUS

## 2015-01-08 MED ORDER — GLYCOPYRROLATE 0.2 MG/ML IJ SOLN: 0.2000 mg | Freq: Once | INTRAMUSCULAR | Status: DC | PRN

## 2015-01-08 MED ORDER — FENTANYL CITRATE (PF) 100 MCG/2ML IJ SOLN: 50.0000 ug | INTRAMUSCULAR | Status: DC | PRN

## 2015-01-08 MED ORDER — FENTANYL CITRATE (PF) 100 MCG/2ML IJ SOLN
INTRAMUSCULAR | Status: AC
  Filled 2015-01-08: qty 4

## 2015-01-08 MED ORDER — FENTANYL CITRATE (PF) 100 MCG/2ML IJ SOLN
INTRAMUSCULAR | Status: DC | PRN
  Administered 2015-01-08: 100 ug via INTRAVENOUS

## 2015-01-08 MED ORDER — CEFAZOLIN SODIUM-DEXTROSE 2-3 GM-% IV SOLR
INTRAVENOUS | Status: DC | PRN
  Administered 2015-01-08: 2 g via INTRAVENOUS

## 2015-01-08 MED ORDER — MIDAZOLAM HCL 2 MG/2ML IJ SOLN
INTRAMUSCULAR | Status: AC
  Filled 2015-01-08: qty 2

## 2015-01-08 MED ORDER — PHENYLEPHRINE HCL 10 MG/ML IJ SOLN
INTRAMUSCULAR | Status: DC | PRN
  Administered 2015-01-08: 40 ug via INTRAVENOUS

## 2015-01-08 MED ORDER — EPHEDRINE SULFATE 50 MG/ML IJ SOLN
INTRAMUSCULAR | Status: DC | PRN
  Administered 2015-01-08 (×2): 5 mg via INTRAVENOUS
  Administered 2015-01-08: 10 mg via INTRAVENOUS

## 2015-01-08 MED ORDER — MIDAZOLAM HCL 2 MG/2ML IJ SOLN
1.0000 mg | INTRAMUSCULAR | Status: DC | PRN
  Administered 2015-01-08: 2 mg via INTRAVENOUS

## 2015-01-08 MED ORDER — CHLORHEXIDINE GLUCONATE 4 % EX LIQD: 60.0000 mL | Freq: Once | CUTANEOUS | Status: DC

## 2015-01-08 MED ORDER — HYDROCODONE-ACETAMINOPHEN 5-325 MG PO TABS: 1.0000 | ORAL_TABLET | Freq: Four times a day (QID) | ORAL | Status: DC | PRN

## 2015-01-08 MED ORDER — LIDOCAINE HCL (CARDIAC) 20 MG/ML IV SOLN
INTRAVENOUS | Status: DC | PRN
  Administered 2015-01-08: 100 mg via INTRAVENOUS

## 2015-01-08 SURGICAL SUPPLY — 86 items
BAG DECANTER FOR FLEXI CONT (MISCELLANEOUS) IMPLANT
BALL CTTN LRG ABS STRL LF (GAUZE/BANDAGES/DRESSINGS)
BLADE MINI RND TIP GREEN BEAV (BLADE) ×2 IMPLANT
BLADE SURG 15 STRL LF DISP TIS (BLADE) ×1 IMPLANT
BLADE SURG 15 STRL SS (BLADE) ×3
BNDG CMPR 9X4 STRL LF SNTH (GAUZE/BANDAGES/DRESSINGS) ×1
BNDG COHESIVE 1X5 TAN STRL LF (GAUZE/BANDAGES/DRESSINGS) ×2 IMPLANT
BNDG COHESIVE 3X5 TAN STRL LF (GAUZE/BANDAGES/DRESSINGS) ×3 IMPLANT
BNDG CONFORM 2 STRL LF (GAUZE/BANDAGES/DRESSINGS) IMPLANT
BNDG ESMARK 4X9 LF (GAUZE/BANDAGES/DRESSINGS) ×3 IMPLANT
BNDG GAUZE ELAST 4 BULKY (GAUZE/BANDAGES/DRESSINGS) IMPLANT
CHLORAPREP W/TINT 26ML (MISCELLANEOUS) ×3 IMPLANT
CORDS BIPOLAR (ELECTRODE) ×3 IMPLANT
COTTONBALL LRG STERILE PKG (GAUZE/BANDAGES/DRESSINGS) IMPLANT
COVER BACK TABLE 60X90IN (DRAPES) ×3 IMPLANT
COVER MAYO STAND STRL (DRAPES) ×3 IMPLANT
CUFF TOURNIQUET SINGLE 18IN (TOURNIQUET CUFF) ×3 IMPLANT
DECANTER SPIKE VIAL GLASS SM (MISCELLANEOUS) IMPLANT
DRAIN TLS ROUND 10FR (DRAIN) IMPLANT
DRAPE EXTREMITY T 121X128X90 (DRAPE) ×3 IMPLANT
DRAPE SURG 17X23 STRL (DRAPES) ×3 IMPLANT
DRSG PAD ABDOMINAL 8X10 ST (GAUZE/BANDAGES/DRESSINGS) IMPLANT
GAUZE PACKING IODOFORM 1/4X15 (GAUZE/BANDAGES/DRESSINGS) ×2 IMPLANT
GAUZE SPONGE 4X4 12PLY STRL (GAUZE/BANDAGES/DRESSINGS) ×3 IMPLANT
GAUZE SPONGE 4X4 16PLY XRAY LF (GAUZE/BANDAGES/DRESSINGS) IMPLANT
GAUZE XEROFORM 1X8 LF (GAUZE/BANDAGES/DRESSINGS) ×3 IMPLANT
GLOVE BIO SURGEON STRL SZ 6.5 (GLOVE) ×2 IMPLANT
GLOVE BIO SURGEON STRL SZ7.5 (GLOVE) IMPLANT
GLOVE BIO SURGEONS STRL SZ 6.5 (GLOVE) ×2
GLOVE BIOGEL PI IND STRL 6.5 (GLOVE) IMPLANT
GLOVE BIOGEL PI IND STRL 8 (GLOVE) IMPLANT
GLOVE BIOGEL PI IND STRL 8.5 (GLOVE) ×1 IMPLANT
GLOVE BIOGEL PI INDICATOR 6.5 (GLOVE) ×2
GLOVE BIOGEL PI INDICATOR 8 (GLOVE)
GLOVE BIOGEL PI INDICATOR 8.5 (GLOVE) ×2
GLOVE SURG ORTHO 8.0 STRL STRW (GLOVE) ×3 IMPLANT
GOWN STRL REUS W/ TWL LRG LVL3 (GOWN DISPOSABLE) ×1 IMPLANT
GOWN STRL REUS W/TWL LRG LVL3 (GOWN DISPOSABLE) ×3
GOWN STRL REUS W/TWL XL LVL3 (GOWN DISPOSABLE) ×3 IMPLANT
K-WIRE .035X4 (WIRE) IMPLANT
LOOP VESSEL MAXI BLUE (MISCELLANEOUS) IMPLANT
NDL BLD DRAW 23GX1  MC LAB (NEEDLE) IMPLANT
NDL HYPO 25X1 1.5 SAFETY (NEEDLE) IMPLANT
NDL KEITH (NEEDLE) IMPLANT
NDL PRECISIONGLIDE 27X1.5 (NEEDLE) IMPLANT
NEEDLE BLD DRAW 23GX1   MC LAB (NEEDLE) ×2
NEEDLE BLD DRAW 23GX1  MC LAB (NEEDLE) ×1 IMPLANT
NEEDLE HYPO 25X1 1.5 SAFETY (NEEDLE) IMPLANT
NEEDLE KEITH (NEEDLE) IMPLANT
NEEDLE PRECISIONGLIDE 27X1.5 (NEEDLE) ×3 IMPLANT
NS IRRIG 1000ML POUR BTL (IV SOLUTION) ×3 IMPLANT
PACK BASIN DAY SURGERY FS (CUSTOM PROCEDURE TRAY) ×3 IMPLANT
PAD CAST 3X4 CTTN HI CHSV (CAST SUPPLIES) ×1 IMPLANT
PAD CAST 4YDX4 CTTN HI CHSV (CAST SUPPLIES) IMPLANT
PADDING CAST ABS 3INX4YD NS (CAST SUPPLIES)
PADDING CAST ABS 4INX4YD NS (CAST SUPPLIES) ×2
PADDING CAST ABS COTTON 3X4 (CAST SUPPLIES) IMPLANT
PADDING CAST ABS COTTON 4X4 ST (CAST SUPPLIES) ×1 IMPLANT
PADDING CAST COTTON 3X4 STRL (CAST SUPPLIES) ×3
PADDING CAST COTTON 4X4 STRL (CAST SUPPLIES)
SLEEVE SCD COMPRESS KNEE MED (MISCELLANEOUS) IMPLANT
SPLINT PLASTER CAST XFAST 3X15 (CAST SUPPLIES) IMPLANT
SPLINT PLASTER XTRA FASTSET 3X (CAST SUPPLIES)
STOCKINETTE 4X48 STRL (DRAPES) ×3 IMPLANT
SUT CHROMIC 5 0 P 3 (SUTURE) IMPLANT
SUT ETHIBOND 3-0 V-5 (SUTURE) IMPLANT
SUT ETHILON 3 0 PS 1 (SUTURE) IMPLANT
SUT ETHILON 4 0 PS 2 18 (SUTURE) IMPLANT
SUT FIBERWIRE 4-0 18 DIAM BLUE (SUTURE)
SUT MERSILENE 2.0 SH NDLE (SUTURE) IMPLANT
SUT MERSILENE 4 0 P 3 (SUTURE) IMPLANT
SUT POLY BUTTON 15MM (SUTURE) IMPLANT
SUT PROLENE 2 0 SH DA (SUTURE) IMPLANT
SUT PROLENE 5 0 P 3 (SUTURE) IMPLANT
SUT SILK 4 0 PS 2 (SUTURE) IMPLANT
SUT SUPRAMID 4-0 (SUTURE) IMPLANT
SUT VIC AB 4-0 P-3 18XBRD (SUTURE) IMPLANT
SUT VIC AB 4-0 P2 18 (SUTURE) ×2 IMPLANT
SUT VIC AB 4-0 P3 18 (SUTURE)
SUT VICRYL 4-0 PS2 18IN ABS (SUTURE) IMPLANT
SUTURE FIBERWR 4-0 18 DIA BLUE (SUTURE) IMPLANT
SYR BULB 3OZ (MISCELLANEOUS) ×3 IMPLANT
SYR CONTROL 10ML LL (SYRINGE) ×2 IMPLANT
TOWEL OR 17X24 6PK STRL BLUE (TOWEL DISPOSABLE) ×6 IMPLANT
TUBE FEEDING 5FR 15 INCH (TUBING) IMPLANT
UNDERPAD 30X30 (UNDERPADS AND DIAPERS) ×3 IMPLANT

## 2015-01-08 NOTE — Transfer of Care (Signed)
Immediate Anesthesia Transfer of Care Note  Patient: Tom [name]  Procedure(s) Performed: Procedure(s) with comments: EXCISION FOREIGN BODY  (Left) - ANESTHESIA:  GENERAL, IV REGIONAL UPPER ARM  Patient Location: PACU  Anesthesia Type:General  Level of Consciousness: awake, alert  and oriented  Airway & Oxygen Therapy: Patient Spontanous Breathing and Patient connected to face mask oxygen  Post-op Assessment: Report given to RN, Post -op Vital signs reviewed and stable and Patient moving all extremities  Post vital signs: Reviewed and stable  Last Vitals:  Filed Vitals:   01/08/15 1600  BP:   Pulse: 63  Temp:   Resp: 16    Complications: No apparent anesthesia complications

## 2015-01-08 NOTE — Anesthesia Procedure Notes (Signed)
Procedure Name: LMA Insertion Date/Time: 01/08/2015 3:24 PM Performed by: Baxter Flattery Pre-anesthesia Checklist: Patient identified, Emergency Drugs available, Suction available and Patient being monitored Patient Re-evaluated:Patient Re-evaluated prior to inductionOxygen Delivery Method: Circle System Utilized Preoxygenation: Pre-oxygenation with 100% oxygen Intubation Type: IV induction Ventilation: Mask ventilation without difficulty LMA: LMA inserted LMA Size: 5.0 Number of attempts: 1 Airway Equipment and Method: Bite block Placement Confirmation: positive ETCO2 and breath sounds checked- equal and bilateral Tube secured with: Tape Dental Injury: Teeth and Oropharynx as per pre-operative assessment

## 2015-01-08 NOTE — Op Note (Signed)
Dictation Number [id number]

## 2015-01-08 NOTE — H&P (Signed)
Tom [name] has had an ultrasound revealing a retained foreign body in the flexor tendon Plan is removal of foreign body index finger Past medical history and meds are unchanged. Physical exam Chest clear Heart reg Abdomen soft Left index with draining area Plan removal foreign body left index

## 2015-01-08 NOTE — Discharge Instructions (Addendum)

## 2015-01-08 NOTE — Anesthesia Preprocedure Evaluation (Addendum)
Anesthesia Evaluation  Patient identified by MRN, date of birth, ID band Patient awake    Reviewed: Allergy & Precautions, H&P , NPO status , Patient's Chart, lab work & pertinent test results, reviewed documented beta blocker date and time   Airway Mallampati: I  TM Distance: >3 FB Neck ROM: Full    Dental no notable dental hx.    Pulmonary Current Smoker,    Pulmonary exam normal       Cardiovascular hypertension, Pt. on medications and Pt. on home beta blockers + CAD, + Past MI, + Cardiac Stents, + CABG and + Peripheral Vascular Disease Normal cardiovascular exam+ dysrhythmias Atrial Fibrillation     Neuro/Psych Anxiety negative neurological ROS  negative psych ROS   GI/Hepatic Neg liver ROS, GERD-  Medicated and Controlled,  Endo/Other  negative endocrine ROS  Renal/GU Renal disease  negative genitourinary   Musculoskeletal   Abdominal   Peds  Hematology negative hematology ROS (+)   Anesthesia Other Findings   Reproductive/Obstetrics negative OB ROS                           Anesthesia Physical Anesthesia Plan  ASA: III  Anesthesia Plan: General   Post-op Pain Management:    Induction: Intravenous  Airway Management Planned: LMA  Additional Equipment:   Intra-op Plan:   Post-operative Plan: Extubation in OR  Informed Consent: I have reviewed the patients History and Physical, chart, labs and discussed the procedure including the risks, benefits and alternatives for the proposed anesthesia with the patient or authorized representative who has indicated his/her understanding and acceptance.   Dental advisory given  Plan Discussed with: CRNA and Surgeon  Anesthesia Plan Comments:        Anesthesia Quick Evaluation

## 2015-01-08 NOTE — Anesthesia Postprocedure Evaluation (Signed)
Anesthesia Post Note  Patient: Tom [name]  Procedure(s) Performed: Procedure(s) (LRB): EXCISION FOREIGN BODY  (Left)  Anesthesia type: general  Patient location: PACU  Post pain: Pain level controlled  Post assessment: Patient's Cardiovascular Status Stable  Last Vitals:  Filed Vitals:   01/08/15 1645  BP: 145/60  Pulse: 55  Temp: 36.6 C  Resp: 18    Post vital signs: Reviewed and stable  Level of consciousness: sedated  Complications: No apparent anesthesia complications

## 2015-01-08 NOTE — Brief Op Note (Signed)
01/08/2015  3:50 PM  PATIENT:  Tom [name]  64 y.o. male  PRE-OPERATIVE DIAGNOSIS:  FOREIGN BODY LEFT INDEX FINGER  POST-OPERATIVE DIAGNOSIS:  FOREIGN BODY LEFT INDEX FINGER  PROCEDURE:  Procedure(s) with comments: EXCISION FOREIGN BODY  (Left) - ANESTHESIA:  GENERAL, IV REGIONAL UPPER ARM  SURGEON:  Surgeon(s) and Role:    * Daryll Brod, MD - Primary  PHYSICIAN ASSISTANT:   ASSISTANTS: none   ANESTHESIA:   local and general  EBL:  Total I/O In: 700 [I.V.:700] Out: -   BLOOD ADMINISTERED:none  DRAINS: none   LOCAL MEDICATIONS USED:  BUPIVICAINE   SPECIMEN:  No Specimen  DISPOSITION OF SPECIMEN:  N/A  COUNTS:  YES  TOURNIQUET:   Total Tourniquet Time Documented: Upper Arm (Left) - 11 minutes Total: Upper Arm (Left) - 11 minutes   DICTATION: .Other Dictation: Dictation Number [id number]  PLAN OF CARE: Discharge to home after PACU  PATIENT DISPOSITION:  PACU - hemodynamically stable.

## 2015-01-11 ENCOUNTER — Encounter (HOSPITAL_BASED_OUTPATIENT_CLINIC_OR_DEPARTMENT_OTHER): Payer: Self-pay | Admitting: Orthopedic Surgery

## 2015-01-11 NOTE — Op Note (Signed)
Tom [name], Tom [name]                  ACCOUNT NO.:  [id number]  MEDICAL RECORD NO.:  [id number]  LOCATION:                                FACILITY:  [facility]  PHYSICIAN:  Daryll Brod, M.D.       DATE OF BIRTH:  [birth year]  DATE OF PROCEDURE:  01/08/2015 DATE OF DISCHARGE:  01/08/2015                              OPERATIVE REPORT   PREOPERATIVE DIAGNOSIS:  Foreign body, flexor tendon, left index finger.  POSTOPERATIVE DIAGNOSIS:  Foreign body, flexor tendon, left index finger.  OPERATION:  Excision of foreign body, left index finger flexor tendon.  SURGEON:  Daryll Brod, M.D.  ANESTHESIA:  General with local infiltration, metacarpal block.  ANESTHESIOLOGIST:  Crissie Sickles. Conrad Kistler, M.D.  HISTORY:  The patient is a 64 year old male who suffered an injury to his left index finger when he stuck it with a piece of wood while he was working.  He did not respond to antibiotics, was subsequently seen where he had a flexor sheath infection with a piece of wood in his flexor tendon, which was excised.  He had irrigation of the flexor tendon, this gradually resolved, but then reoccurred.  With re-drainage, irrigation of flexor tendon sheath, MRI was performed revealing a retained foreign body within the flexor tendon.  He is admitted now for removal of the foreign body from his left index finger profundus tendon.  He is aware of risks and complications, the possibility of continued infection; recurrence of injury to arteries, nerves, tendons; possibility of stiffness; loss of mobility especially of the distal interphalangeal joint.  DESCRIPTION OF PROCEDURE:  The patient was brought to the operating room, where a general anesthetic was carried out without difficulty.  He was prepped using ChloraPrep, supine position with Betadine scrub and solution with left arm free.  A 3-minute dry time was allowed.  Time-out taken, confirming the patient and procedure.  A curvilinear incision was made midlateral,  radial side from the entrance wound proximally, carried down through the subcutaneous tissue.  Neurovascular structure was identified and protected.  The flexor sheath was opened.  The foreign body was immediately identified distally, found to be within the tendon, which was partially necrotic.  This area was debrided.  The area just proximal to A4 was also opened.  Flexor tendon showed significant loss of tendon with partial necrosis.  The specimen was removed in toto. This will be given to the patient, measured approximately 2 inches in length.  The wound was copiously irrigated with saline.  An infant feeding tube was then passed down the flexor sheath to irrigate this clear.  The wound was then packed open with iodoform gauze.  Sterile compressive dressing and splint were applied.  Metacarpal block was given 0.25% bupivacaine without epinephrine, approximately 5 mL was used.  The patient tolerated the procedure well, was taken to the recovery room for observation in satisfactory condition.  He has been on Septra, the organism is sensitive to this, which was cultured on the last irrigation.  He will return in 1 week.  A prescription for Norco was given.          ______________________________ Daryll Brod, M.D.  GK/MEDQ  D:  01/08/2015  T:  01/09/2015  Job:  [id number]

## 2015-01-11 NOTE — Addendum Note (Signed)
Addendum  created 01/11/15 1226 by Tawni Millers, CRNA   Modules edited: Charges VN

## 2015-01-16 ENCOUNTER — Other Ambulatory Visit: Payer: Self-pay | Admitting: Cardiovascular Disease

## 2015-01-18 LAB — FUNGUS CULTURE W SMEAR: FUNGAL SMEAR: NONE SEEN

## 2015-01-19 NOTE — Telephone Encounter (Signed)
Per note 10.6.15

## 2015-01-21 ENCOUNTER — Other Ambulatory Visit: Payer: BLUE CROSS/BLUE SHIELD

## 2015-01-23 ENCOUNTER — Other Ambulatory Visit: Payer: Self-pay | Admitting: Cardiovascular Disease

## 2015-02-03 LAB — AFB CULTURE WITH SMEAR (NOT AT ARMC): Acid Fast Smear: NONE SEEN

## 2015-03-03 ENCOUNTER — Other Ambulatory Visit: Payer: Self-pay | Admitting: Cardiovascular Disease

## 2015-05-18 ENCOUNTER — Encounter (INDEPENDENT_AMBULATORY_CARE_PROVIDER_SITE_OTHER): Payer: Self-pay | Admitting: *Deleted

## 2015-06-11 ENCOUNTER — Other Ambulatory Visit: Payer: Self-pay | Admitting: Cardiovascular Disease

## 2015-06-14 ENCOUNTER — Other Ambulatory Visit: Payer: Self-pay | Admitting: Cardiovascular Disease

## 2015-06-14 ENCOUNTER — Encounter: Payer: Self-pay | Admitting: Cardiovascular Disease

## 2015-06-14 ENCOUNTER — Ambulatory Visit (INDEPENDENT_AMBULATORY_CARE_PROVIDER_SITE_OTHER): Payer: PRIVATE HEALTH INSURANCE | Admitting: Cardiovascular Disease

## 2015-06-14 VITALS — BP 138/80 | HR 52 | Ht 70.0 in | Wt 202.8 lb

## 2015-06-14 DIAGNOSIS — I480 Paroxysmal atrial fibrillation: Secondary | ICD-10-CM

## 2015-06-14 DIAGNOSIS — I2510 Atherosclerotic heart disease of native coronary artery without angina pectoris: Secondary | ICD-10-CM | POA: Diagnosis not present

## 2015-06-14 NOTE — Progress Notes (Signed)
Tom [name] Date of Birth  09-14-1950 [facility] [address]    [place], [place]  [address] [phone]  Fax  [phone]  Problem list: 1. Coronary artery disease-status post CABG. February 2007. He had a LIMA  to the LAD and a SVG to the right coronary artery. 2. Hypertension 3. Hyperlipidemia 4. Renal insufficiency 5. Peripheral Vascular disease:  S/p AAA stent graft repair ( Oct. 2013)  s/p renal artery stenting  ( Dec. 2013) 6. Paroxysmal atrial fibrillation:   History of Present Illness:  64 y.o. year-old gentleman with a history of coronary disease. He status post coronary artery bypass grafting in February 2007. He had a left internal mammary artery to the LAD and a saphenous vein graft to the right coronary artery. He also has a history of hypercholesterolemia, hypertension, and ongoing cigarette smoking. He's not had any episodes of chest pain, shortness breath, syncope, or presyncope.  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 He does not do any regular aerobic exercise. He's not had any recurrent episodes of chest pain. He tries to watch his salt intake.  He still smokes about a half a pack of cigarettes a day but has cut back from 2 packs a day. 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  November 22, 2012:  He has had an AAA stent graft repair and renal artery stenting since I last saw him.  He has had several episodes of chest pressure / tightness.  He found his BP and Hr  to be elevated.  He took and extra Toprol and ASA and felt better within an hour.  Oct. 24, 2014:  Tom [name] is having CP and palpitations .  Some relief with Inderal but occasionally has to take some NTG.  Symptoms seen to occur more at night - not during the day while he is working.  stil smoking 1/2 ppd.  Nov. 21, 2014:  Tom [name] is doing ok.  He was diagnosed with atrial fib by event monitor.  He has had several episodes of PAF - resolved  after several propranolol  Jan 02, 2014:  He is doing well.  Still have PAF - probably once a month.  Will last 4-6 hours.   He does not drink ETOH.   still smoking some .  Works as a Curator.   Oct. 24, 2016:  No CP ,  No recent episodes of PAF since starting the Multaq.  Was started by Dr. Lovena Le in Oct. 2015 Has not been taking his amlodipine due to leg swelling . Doing well.   BP is typcially ok  Getting some exercise Yard work  Retired from [employer]     Current Outpatient Prescriptions on File Prior to Visit  Medication Sig Dispense Refill  . alprazolam (XANAX) 2 MG tablet Take 2 mg by mouth at bedtime.    [name] Kitchen amLODipine (NORVASC) 5 MG tablet TAKE 1 TABLET BY MOUTH EVERY DAY (Patient not taking: Reported on 06/14/2015) 30 tablet 6  . aspirin 81 MG tablet Take 1 tablet (81 mg total) by mouth daily.    [name] Kitchen atorvastatin (LIPITOR) 40 MG tablet Take 40 mg by mouth daily.      [name] Kitchen dronedarone (MULTAQ) 400 MG tablet Take 1 tablet (400 mg total) by mouth 2 (two) times daily with a meal. 180 tablet 1  . HYDROcodone-acetaminophen (NORCO) 5-325 MG per tablet Take 1 tablet by mouth every 6 (six) hours  as needed for moderate pain. 30 tablet 0  . metoprolol (TOPROL-XL) 100 MG 24 hr tablet Take 100 mg by mouth daily.     . nitroGLYCERIN (NITROSTAT) 0.4 MG SL tablet Place 0.4 mg under the tongue every 5 (five) minutes as needed. Chest pain    . pantoprazole (PROTONIX) 40 MG tablet Take 40 mg by mouth daily.      [name] Kitchen sulfamethoxazole-trimethoprim (BACTRIM,SEPTRA) 400-80 MG per tablet Take 1 tablet by mouth 2 (two) times daily.    . valsartan (DIOVAN) 320 MG tablet TAKE 1 TABLET BY MOUTH DAILY. 30 tablet 6  . XARELTO 20 MG TABS tablet TAKE 1 TABLET BY MOUTH DAILY WITH SUPPER 30 tablet 11  . ZETIA 10 MG tablet TAKE 1 TABLET (10 MG TOTAL) BY MOUTH DAILY. 30 tablet 1   No current facility-administered medications on file prior to visit.    Allergies  Allergen Reactions  . Niacin And Related Hives  .  Codeine Other (See Comments)    GI upset ONLY in the cough syrup   . Niacin Hives    Past Medical History  Diagnosis Date  . Anxiety   . Hypercholesteremia   . AAA (abdominal aortic aneurysm) ([place])   . Cancer [facility]) 2013    skin cancer,   MOHS surgery  . Myocardial infarction ([place]) 1997  . Coronary artery disease   . GERD (gastroesophageal reflux disease)   . Wears glasses   . Hypertension     states under control with med., has been on med. x 20 yr.  . PAF (paroxysmal atrial fibrillation) ([place])   . Dental crowns present   . Dental bridge present     upper  . Family history of adverse reaction to anesthesia     pt's mother has hx. of post-op N/V  . Foreign body of finger of left hand 12/2014    index finger    Past Surgical History  Procedure Laterality Date  . Abdominal aortic aneurysm repair  06/12/2012  . Renal angiogram N/A 08/09/2012    Procedure: RENAL ANGIOGRAM;  Surgeon: Elam Dutch, MD;  Location: [facility] C[facility] LAB;  Service: Cardiovascular;  Laterality: N/A;  . Percutaneous stent intervention Left 08/09/2012    Procedure: PERCUTANEOUS STENT INTERVENTION;  Surgeon: Elam Dutch, MD;  Location: [facility] CATH LAB;  Service: Cardiovascular;  Laterality: Left;  stent to lt renal artery  . Colonoscopy    . I&d extremity Left 11/12/2014    Procedure: IRRIGATION AND DEBRIDEMENT LEFT INDEX FINGER;  Surgeon: Daryll Brod, MD;  Location: [place];  Service: Orthopedics;  Laterality: Left;  . Incision and drainage abscess Left 12/22/2014    Procedure: INCISION AND DRAINAGE ABSCESS LEFT INDEX FINGER;  Surgeon: Daryll Brod, MD;  Location: [place];  Service: Orthopedics;  Laterality: Left;  . Cardiac catheterization  10/03/2005  . Coronary artery bypass graft  10/16/2005    LIMA to LAD saphenous vein to RCA  . Foreign body removal Left 01/08/2015    Procedure: EXCISION FOREIGN BODY ;  Surgeon: Daryll Brod, MD;  Location: [place];  Service:  Orthopedics;  Laterality: Left;  ANESTHESIA:  GENERAL, IV REGIONAL UPPER ARM    History  Smoking status  . Current Every Day Smoker -- 0.50 packs/day for 44 years  . Types: Cigarettes  Smokeless tobacco  . Never Used    History  Alcohol Use No    Family History  Problem Relation Age of Onset  . Heart  disease Father   . Hypertension Father   . Hyperlipidemia Father   . Diabetes Father   . Heart attack Father   . Heart disease Mother   . Hypertension Mother   . Hyperlipidemia Mother   . Anesthesia problems Mother     hx. of post-op N/V  . AAA (abdominal aortic aneurysm) Maternal Uncle   . Cancer Maternal Uncle   . Hyperlipidemia Sister     Reviw of Systems:  Reviewed in the HPI.  All other systems are negative.  Physical Exam: BP 138/80 mmHg  Pulse 52  Ht 5\' 10"  (1.778 m)  Wt 202 lb 12.8 oz (91.989 kg)  BMI 29.10 kg/m2  SpO2 96% The patient is alert and oriented x 3.  The mood and affect are normal.   Skin: warm and dry.  Color is normal.   HEENT:   the sclera are nonicteric.  The mucous membranes are moist.  The carotids are 2+ without bruits.  There is no thyromegaly.  There is no JVD.   Lungs: clear.  The chest wall is non tender.   Heart: regular rate with a normal S1 and S2.  There are no murmurs, gallops, or rubs. The PMI is not displaced.    Abdomen: good bowel sounds.  There is no guarding or rebound.   Extremities:  no clubbing, cyanosis, or edema.  The legs are without rashes.  The distal pulses are intact.  Neuro:  Cranial nerves II - XII are intact.  Motor and sensory functions are intact.   The gait is normal.  ECG: 06/14/2015: Sinus bradycardia at 49. He has no ST or T wave changes.   Assessment / Plan:   1. Coronary artery disease-status post CABG. February 2007. He had a LIMA  to the LAD and a SVG to the right coronary artery. He's doing fairly well. I've encouraged him to stop smoking.  2. Hypertension  :  He stopped taking the amlodipine  because of leg swelling. His blood pressure seems to be well-controlled.  3. Hyperlipidemia:  Continue atorvastatin. We'll check fasting labs today.  4. Renal insufficiency  5. Peripheral Vascular disease:  S/p AAA stent graft repair (Oct. 2013)  s/p renal artery stenting  (Dec. 2013)  6. Paroxysmal atrial fibrillation:  He is paroxysmal atrial fibrillation is well-controlled on the current dose of Multaq. Continue Xarelto.    Valeria Boza, Wonda Cheng, MD  06/14/2015 8:55 AM    [facility],  [place], [place]  [address] Pager [phone] Phone: [phone]; Fax: [phone]   [facility]  [address] [place], [place]  [address] [phone]   Fax [phone]

## 2015-06-14 NOTE — Patient Instructions (Signed)
Medication Instructions:  STOP Amlodipine   Labwork: TODAY - cholesterol, liver, basic metabolic panel   Testing/Procedures: None Ordered   Follow-Up: Your physician wants you to follow-up in: 1 year with  Dr. Acie Fredrickson.  You will receive a reminder letter in the mail two months in advance. If you don't receive a letter, please call our office to schedule the follow-up appointment.

## 2015-06-15 ENCOUNTER — Encounter: Payer: Self-pay | Admitting: Vascular Surgery

## 2015-06-15 LAB — BASIC METABOLIC PANEL
BUN: 16 mg/dL (ref 7–25)
CO2: 27 mmol/L (ref 20–31)
CREATININE: 1.37 mg/dL — AB (ref 0.70–1.25)
Calcium: 8.8 mg/dL (ref 8.6–10.3)
Chloride: 105 mmol/L (ref 98–110)
Glucose, Bld: 90 mg/dL (ref 65–99)
Potassium: 4.1 mmol/L (ref 3.5–5.3)
Sodium: 139 mmol/L (ref 135–146)

## 2015-06-15 LAB — HEPATIC FUNCTION PANEL
ALK PHOS: 57 U/L (ref 40–115)
ALT: 28 U/L (ref 9–46)
AST: 25 U/L (ref 10–35)
Albumin: 4.1 g/dL (ref 3.6–5.1)
BILIRUBIN TOTAL: 0.4 mg/dL (ref 0.2–1.2)
Bilirubin, Direct: 0.1 mg/dL (ref <=0.2)
Indirect Bilirubin: 0.3 mg/dL (ref 0.2–1.2)
TOTAL PROTEIN: 6.5 g/dL (ref 6.1–8.1)

## 2015-06-15 LAB — LIPID PANEL
Cholesterol: 96 mg/dL — ABNORMAL LOW (ref 125–200)
HDL: 27 mg/dL — AB (ref >=40)
LDL Cholesterol: 53 mg/dL (ref <130)
Total CHOL/HDL Ratio: 3.6 Ratio (ref <=5.0)
Triglycerides: 81 mg/dL (ref <150)
VLDL: 16 mg/dL (ref <30)

## 2015-06-16 ENCOUNTER — Other Ambulatory Visit: Payer: Self-pay | Admitting: Nurse Practitioner

## 2015-06-16 DIAGNOSIS — E785 Hyperlipidemia, unspecified: Secondary | ICD-10-CM

## 2015-06-17 ENCOUNTER — Ambulatory Visit (HOSPITAL_COMMUNITY)
Admission: RE | Admit: 2015-06-17 | Discharge: 2015-06-17 | Disposition: A | Discharge status: Home / Self Care | Payer: PRIVATE HEALTH INSURANCE | Source: Ambulatory Visit | Attending: Vascular Surgery | Admitting: Vascular Surgery

## 2015-06-17 ENCOUNTER — Encounter: Payer: Self-pay | Admitting: Vascular Surgery

## 2015-06-17 ENCOUNTER — Ambulatory Visit (INDEPENDENT_AMBULATORY_CARE_PROVIDER_SITE_OTHER): Payer: PRIVATE HEALTH INSURANCE | Admitting: Vascular Surgery

## 2015-06-17 ENCOUNTER — Other Ambulatory Visit: Payer: Self-pay | Admitting: Vascular Surgery

## 2015-06-17 VITALS — BP 133/74 | HR 50 | Ht 70.0 in | Wt 202.0 lb

## 2015-06-17 DIAGNOSIS — I701 Atherosclerosis of renal artery: Secondary | ICD-10-CM

## 2015-06-17 DIAGNOSIS — I714 Abdominal aortic aneurysm, without rupture, unspecified: Secondary | ICD-10-CM

## 2015-06-17 NOTE — Addendum Note (Signed)
Addended by: Dorthula Rue L on: 06/17/2015 01:00 PM   Modules accepted: Orders

## 2015-06-17 NOTE — Progress Notes (Signed)
History of Present Illness:  Patient is a 77  male who presents for follow-up evaluation of AAA. He underwent Gore Excluder aneurysm stent graft repair in October 2013. He has also had a left renal artery stent and has known carotid stenosis of 40%. The patient denies new abdominal or back pain.  He denies any symptoms of TIA amaurosis or stroke. He has had a decrease in his blood pressure medications. He is now on Xarelto for atrial fibrillation. The patient's atherosclerotic risk factors remain tobacco abuse hypertension elevated cholesterol.   Otherwise his medical problems are stable and followed by his primary care physician. He has not noted any upward trend in his blood pressure.     Past Medical History    Diagnosis   Date    .   Anxiety       .   Renal insufficiency       .   Hypertension       .   Hypercholesteremia       .   AAA (abdominal aortic aneurysm)       .   Cancer   2013          skin cancer,   MOHS surgery    .   COPD (chronic obstructive pulmonary disease)       .   Myocardial infarction   1997    .   Complication of anesthesia             hard to sedate    .   Coronary artery disease       .   GERD (gastroesophageal reflux disease)       .   Atrial fibrillation         Review of Systems:  Neurologic: denies symptoms of TIA, amaurosis, or stroke Cardiac:denies shortness of breath or chest pain Pulmonary: denies cough or wheeze Abdomen: denies abdominal pain nausea or vomiting  Social History   Social History  . Marital Status: Married    Spouse Name: N/A  . Number of Children: 1 s  . Years of Education: N/A   Occupational History  . Not on file.   Social History Main Topics  . Smoking status: Current Every Day Smoker -- 0.50 packs/day for 44 years    Types: Cigarettes  . Smokeless tobacco: Never Used  . Alcohol Use: No  . Drug Use: No  . Sexual Activity: Yes   Other Topics Concern  . Not on file   Social History Narrative        Allergies      Allergen    Reactions     .    Codeine    Other (See Comments)             GI upset ONLY in the cough syrup      .    Niacin    Hives         Current Outpatient Prescriptions on File Prior to Visit  Medication Sig Dispense Refill  . alprazolam (XANAX) 2 MG tablet Take 2 mg by mouth at bedtime.    [name] Kitchen aspirin 81 MG tablet Take 1 tablet (81 mg total) by mouth daily.    [name] Kitchen atorvastatin (LIPITOR) 40 MG tablet Take 40 mg by mouth daily.      [name] Kitchen dronedarone (MULTAQ) 400 MG tablet Take 1 tablet (400 mg total) by mouth 2 (two) times daily with a meal. 180 tablet 1  . metoprolol (TOPROL-XL) 100  MG 24 hr tablet Take 100 mg by mouth daily.     . nitroGLYCERIN (NITROSTAT) 0.4 MG SL tablet Place 0.4 mg under the tongue every 5 (five) minutes as needed. Chest pain    . pantoprazole (PROTONIX) 40 MG tablet Take 40 mg by mouth daily.      . valsartan (DIOVAN) 320 MG tablet TAKE 1 TABLET BY MOUTH DAILY. 30 tablet 6  . XARELTO 20 MG TABS tablet TAKE 1 TABLET BY MOUTH DAILY WITH SUPPER 30 tablet 11  . ZETIA 10 MG tablet TAKE 1 TABLET (10 MG TOTAL) BY MOUTH DAILY. 30 tablet 1  . HYDROcodone-acetaminophen (NORCO) 5-325 MG per tablet Take 1 tablet by mouth every 6 (six) hours as needed for moderate pain. (Patient not taking: Reported on 06/17/2015) 30 tablet 0  . sulfamethoxazole-trimethoprim (BACTRIM,SEPTRA) 400-80 MG per tablet Take 1 tablet by mouth 2 (two) times daily.     No current facility-administered medications on file prior to visit.    Physical Examination    Filed Vitals:   06/17/15 0931 06/17/15 0932  BP: 150/86 133/74  Pulse: 50   Height: 5\' 10"  (1.778 m)   Weight: 202 lb (91.627 kg)   SpO2: 98%    General:  Alert and oriented, no acute distress HEENT: Normal Neck: Left carotid bruit 2+ carotid pulses Pulmonary: Clear to auscultation bilaterally Cardiac: Regular Rate and Rhythm without murmur Abdomen: Soft, non-tender, non-distended, normal bowel sounds, no pulsatile mass Extremities:  2+ femoral pulses pulses bilaterally   DATA:     Aortic and Renal artery duplex shows possible greater than 60% stenosis left renal artery velocities unchanged from his previous study in April 2016. Right renal artery was widely patent. Kidney size was 10 cm which is unchanged. Abdominal aorta was 4.4 cm in diameter slightly decreased from diameter 4.8 cm in April 2016. Carotid duplex was not repeated today but was less than 40% bilaterally in April 2016.    ASSESSMENT:   Doing well status post Gore Excluder stent graft repair aneurysm.  Possible recurrent left renal artery stenosis but no significant increases in blood pressure currently.  Minimal stenosis carotid arteries bilaterally   PLAN: Patient will return in 1 year for repeat renal artery duplex scan. If velocities are continuing to increase or more suggestion of recurrent stenosis we will consider CT angiogram or catheter-based angiogram. He will need a repeat duplex of his aortic stent graft in one year. His carotids have minimal stenosis today I believe we can safely follow these clinically for now.  Ruta Hinds, MD [facility] Office: [phone] Pager: [phone]  VASCULAR QUALITY INITIATIVE FOLLOW UP DATA:  Current smoker: [x  ] yes  [  ] no  Living status: [x  ]  Home  [  ] Nursing home  [  ] Homeless     MEDS:  ASA [  ] yes  [ x ] no- [x  ] medical reason pt on Xarelto for afib  [  ] non compliant  STATIN  [ x ] yes  [  ] no- [  ] medical reason  [  ] non compliant  Beta blocker [ x ] yes  [  ] no- [  ] medical reason  [  ] non compliant  ACE inhibitor [ x ] yes  [  ] no- [  ] medical reason  [  ] non compliant  P2Y12 Antagonist [  ] none  [  ] clopidogrel-Plavix  [  ]  ticlopidine-Ticlid   [  ] prasugrel-Effient  [  ] ticagrelor- Brilinta    Anticoagulant [  ] None  [  ] warfarin  [ x ] rivaroxaban-Xarelto [  ] dabigatran- Pradaxa  Current Max AAA = 44 mm  Endoleak:  [ x  ] no  [  ] yes- [  ] type I  [  ] type II  [  ] type III  [  ] indeterminate  Number of new interventions: 0 -   Date:      Why:  [  ] endoleak  [  ] limb occlusion    [  ] growth  [  ]  Migration   [  ] symtomatic/ rupture    Conversion to open repair: [  ] yes   [ x ] no   Date:   Other operation related to EVAR:  [  ] yes   [  x] no

## 2015-06-29 ENCOUNTER — Other Ambulatory Visit: Payer: Self-pay | Admitting: Cardiovascular Disease

## 2015-07-02 ENCOUNTER — Other Ambulatory Visit: Payer: Self-pay | Admitting: *Deleted

## 2015-07-02 DIAGNOSIS — I480 Paroxysmal atrial fibrillation: Secondary | ICD-10-CM

## 2015-07-02 MED ORDER — DRONEDARONE HCL 400 MG PO TABS: 400.0000 mg | ORAL_TABLET | Freq: Two times a day (BID) | ORAL | Status: DC

## 2015-08-19 ENCOUNTER — Other Ambulatory Visit: Payer: Self-pay | Admitting: Cardiovascular Disease

## 2016-05-11 ENCOUNTER — Other Ambulatory Visit: Payer: Self-pay | Admitting: Cardiovascular Disease

## 2016-05-31 ENCOUNTER — Encounter: Payer: Self-pay | Admitting: Cardiovascular Disease

## 2016-06-05 ENCOUNTER — Telehealth: Payer: Self-pay | Admitting: Cardiovascular Disease

## 2016-06-05 DIAGNOSIS — E785 Hyperlipidemia, unspecified: Secondary | ICD-10-CM

## 2016-06-05 NOTE — Telephone Encounter (Signed)
Tom [name] is calling because she would like to know if her husband needs lab work for his 06/13/16 appt its been a year since they have came in the office and since his appt is @1 :45pm he would like to do labs that morning.

## 2016-06-05 NOTE — Telephone Encounter (Signed)
Spoke with patient and scheduled him for fasting lab appointment on 10/23.  He has an appointment with Dr. Acie Fredrickson on 10/24

## 2016-06-12 ENCOUNTER — Other Ambulatory Visit: Payer: Self-pay | Admitting: *Deleted

## 2016-06-12 DIAGNOSIS — E785 Hyperlipidemia, unspecified: Secondary | ICD-10-CM

## 2016-06-12 LAB — COMPREHENSIVE METABOLIC PANEL
ALK PHOS: 68 U/L (ref 40–115)
ALT: 19 U/L (ref 9–46)
AST: 20 U/L (ref 10–35)
Albumin: 4 g/dL (ref 3.6–5.1)
BUN: 11 mg/dL (ref 7–25)
CALCIUM: 9.3 mg/dL (ref 8.6–10.3)
CO2: 25 mmol/L (ref 20–31)
CREATININE: 1.42 mg/dL — AB (ref 0.70–1.25)
Chloride: 106 mmol/L (ref 98–110)
GLUCOSE: 101 mg/dL — AB (ref 65–99)
Potassium: 3.8 mmol/L (ref 3.5–5.3)
SODIUM: 140 mmol/L (ref 135–146)
Total Bilirubin: 0.6 mg/dL (ref 0.2–1.2)
Total Protein: 6.3 g/dL (ref 6.1–8.1)

## 2016-06-12 LAB — LIPID PANEL
Cholesterol: 113 mg/dL — ABNORMAL LOW (ref 125–200)
HDL: 26 mg/dL — ABNORMAL LOW (ref >=40)
LDL CALC: 49 mg/dL (ref <130)
Total CHOL/HDL Ratio: 4.3 Ratio (ref <=5.0)
Triglycerides: 190 mg/dL — ABNORMAL HIGH (ref <150)
VLDL: 38 mg/dL — ABNORMAL HIGH (ref <30)

## 2016-06-13 ENCOUNTER — Encounter: Payer: Self-pay | Admitting: Cardiovascular Disease

## 2016-06-13 ENCOUNTER — Ambulatory Visit (INDEPENDENT_AMBULATORY_CARE_PROVIDER_SITE_OTHER): Payer: Managed Care, Other (non HMO) | Admitting: Cardiovascular Disease

## 2016-06-13 VITALS — BP 170/82 | HR 56 | Ht 70.0 in | Wt 195.8 lb

## 2016-06-13 DIAGNOSIS — I480 Paroxysmal atrial fibrillation: Secondary | ICD-10-CM

## 2016-06-13 DIAGNOSIS — I2510 Atherosclerotic heart disease of native coronary artery without angina pectoris: Secondary | ICD-10-CM | POA: Diagnosis not present

## 2016-06-13 MED ORDER — DRONEDARONE HCL 400 MG PO TABS: 400.0000 mg | ORAL_TABLET | Freq: Two times a day (BID) | ORAL | 3 refills | Status: DC

## 2016-06-13 MED ORDER — RIVAROXABAN 20 MG PO TABS: ORAL_TABLET | ORAL | 3 refills | Status: DC

## 2016-06-13 MED ORDER — EZETIMIBE 10 MG PO TABS: ORAL_TABLET | ORAL | 3 refills | Status: DC

## 2016-06-13 MED ORDER — NITROGLYCERIN 0.4 MG SL SUBL
0.4000 mg | SUBLINGUAL_TABLET | SUBLINGUAL | 6 refills | Status: DC | PRN
Start: 2016-06-13 — End: 2018-10-09

## 2016-06-13 MED ORDER — ATORVASTATIN CALCIUM 40 MG PO TABS: 40.0000 mg | ORAL_TABLET | Freq: Every day | ORAL | 3 refills | Status: AC

## 2016-06-13 MED ORDER — VALSARTAN 320 MG PO TABS: 320.0000 mg | ORAL_TABLET | Freq: Every day | ORAL | 3 refills | Status: DC

## 2016-06-13 NOTE — Progress Notes (Signed)
Tom [name] Date of Birth  Jan 25, 1951 [facility] [address]    [name], [place]  [address] [phone]  Fax  [phone]  Problem list: 1. Coronary artery disease-status post CABG. February 2007. He had a LIMA  to the LAD and a SVG to the right coronary artery. 2. Hypertension 3. Hyperlipidemia 4. Renal insufficiency 5. Peripheral Vascular disease:  S/p AAA stent graft repair ( Oct. 2013)  s/p renal artery stenting  ( Dec. 2013) 6. Paroxysmal atrial fibrillation:   History of Present Illness:  65 y.o. year-old gentleman with a history of coronary disease. He status post coronary artery bypass grafting in February 2007. He had a left internal mammary artery to the LAD and a saphenous vein graft to the right coronary artery. He also has a history of hypercholesterolemia, hypertension, and ongoing cigarette smoking. He's not had any episodes of chest pain, shortness breath, syncope, or presyncope.  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 He does not do any regular aerobic exercise. He's not had any recurrent episodes of chest pain. He tries to watch his salt intake.  He still smokes about a half a pack of cigarettes a day but has cut back from 2 packs a day. 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  November 22, 2012:  He has had an AAA stent graft repair and renal artery stenting since I last saw him.  He has had several episodes of chest pressure / tightness.  He found his BP and Hr  to be elevated.  He took and extra Toprol and ASA and felt better within an hour.  Oct. 24, 2014:  Tom [name] is having CP and palpitations .  Some relief with Inderal but occasionally has to take some NTG.  Symptoms seen to occur more at night - not during the day while he is working.  stil smoking 1/2 ppd.  Nov. 21, 2014:  Tom [name] is doing ok.  He was diagnosed with atrial fib by event monitor.  He has had several episodes of PAF - resolved  after several propranolol  Jan 02, 2014:  He is doing well.  Still have PAF - probably once a month.  Will last 4-6 hours.   He does not drink ETOH.   still smoking some .  Works as a Curator.   Oct. 24, 2016:  No CP ,  No recent episodes of PAF since starting the Multaq.  Was started by Dr. Lovena Le in Oct. 2015 Has not been taking his amlodipine due to leg swelling . Doing well.   BP is typcially ok  Getting some exercise Yard work  Retired from painting    Oct. 24, 2017:  Tom [name] is doing well.  Thinks he forgot his BP meds today .  Typical BP is well controlled.  Hs PAF - was started on Multaq.      Current Outpatient Prescriptions on File Prior to Visit  Medication Sig Dispense Refill  . alprazolam (XANAX) 2 MG tablet Take 2 mg by mouth at bedtime.    [name] Kitchen aspirin 81 MG tablet Take 1 tablet (81 mg total) by mouth daily.    [name] Kitchen atorvastatin (LIPITOR) 40 MG tablet Take 40 mg by mouth daily.      [name] Kitchen dronedarone (MULTAQ) 400 MG tablet Take 1 tablet (400 mg total) by mouth 2 (two) times daily with a meal. 180 tablet 3  . metoprolol (TOPROL-XL) 100  MG 24 hr tablet Take 100 mg by mouth daily.     . nitroGLYCERIN (NITROSTAT) 0.4 MG SL tablet Place 0.4 mg under the tongue every 5 (five) minutes as needed. Chest pain    . pantoprazole (PROTONIX) 40 MG tablet Take 40 mg by mouth daily.      . valsartan (DIOVAN) 320 MG tablet TAKE 1 TABLET BY MOUTH DAILY. 90 tablet 0  . XARELTO 20 MG TABS tablet TAKE 1 TABLET BY MOUTH DAILY WITH SUPPER 30 tablet 11  . ZETIA 10 MG tablet TAKE 1 TABLET (10 MG TOTAL) BY MOUTH DAILY. 30 tablet 1   No current facility-administered medications on file prior to visit.     Allergies  Allergen Reactions  . Niacin And Related Hives  . Codeine Other (See Comments)    GI upset ONLY in the cough syrup   . Niacin Hives    Past Medical History:  Diagnosis Date  . AAA (abdominal aortic aneurysm) ([place])   . Anxiety   . Cancer [facility]) 2013   skin cancer,   MOHS  surgery  . Coronary artery disease   . Dental bridge present    upper  . Dental crowns present   . Family history of adverse reaction to anesthesia    pt's mother has hx. of post-op N/V  . Foreign body of finger of left hand 12/2014   index finger  . GERD (gastroesophageal reflux disease)   . Hypercholesteremia   . Hypertension    states under control with med., has been on med. x 20 yr.  . Myocardial infarction 1997  . PAF (paroxysmal atrial fibrillation) ([place])   . Wears glasses     Past Surgical History:  Procedure Laterality Date  . ABDOMINAL AORTIC ANEURYSM REPAIR  06/12/2012  . CARDIAC CATHETERIZATION  10/03/2005  . COLONOSCOPY    . CORONARY ARTERY BYPASS GRAFT  10/16/2005   LIMA to LAD saphenous vein to RCA  . FOREIGN BODY REMOVAL Left 01/08/2015   Procedure: EXCISION FOREIGN BODY ;  Surgeon: Daryll Brod, MD;  Location: [place];  Service: Orthopedics;  Laterality: Left;  ANESTHESIA:  GENERAL, IV REGIONAL UPPER ARM  . I&D EXTREMITY Left 11/12/2014   Procedure: IRRIGATION AND DEBRIDEMENT LEFT INDEX FINGER;  Surgeon: Daryll Brod, MD;  Location: [place];  Service: Orthopedics;  Laterality: Left;  . INCISION AND DRAINAGE ABSCESS Left 12/22/2014   Procedure: INCISION AND DRAINAGE ABSCESS LEFT INDEX FINGER;  Surgeon: Daryll Brod, MD;  Location: [place];  Service: Orthopedics;  Laterality: Left;  . PERCUTANEOUS STENT INTERVENTION Left 08/09/2012   Procedure: PERCUTANEOUS STENT INTERVENTION;  Surgeon: Elam Dutch, MD;  Location: [facility] CATH LAB;  Service: Cardiovascular;  Laterality: Left;  stent to lt renal artery  . RENAL ANGIOGRAM N/A 08/09/2012   Procedure: RENAL ANGIOGRAM;  Surgeon: Elam Dutch, MD;  Location[facility] CATH LAB;  Service: Cardiovascular;  Laterality: N/A;    History  Smoking Status  . Current Every Day Smoker  . Packs/day: 0.50  . Years: 44.00  . Types: Cigarettes  Smokeless Tobacco  . Never Used    History   Alcohol Use No    Family History  Problem Relation Age of Onset  . Heart disease Father     before age 41  . Hypertension Father   . Hyperlipidemia Father   . Diabetes Father   . Heart attack Father   . Heart disease Mother   . Hypertension Mother   .  Hyperlipidemia Mother   . Anesthesia problems Mother     hx. of post-op N/V  . AAA (abdominal aortic aneurysm) Maternal Uncle   . Cancer Maternal Uncle   . Hyperlipidemia Sister     Reviw of Systems:  Reviewed in the HPI.  All other systems are negative.  Physical Exam: BP (!) 170/82   Pulse (!) 56   Ht 5\' 10"  (1.778 m)   Wt 195 lb 12.8 oz (88.8 kg)   SpO2 96%   BMI 28.09 kg/m  The patient is alert and oriented x 3.  The mood and affect are normal.   Skin: warm and dry.  Color is normal.   HEENT:   the sclera are nonicteric.  The mucous membranes are moist.  The carotids are 2+ without bruits.  There is no thyromegaly.  There is no JVD.   Lungs: clear.  The chest wall is non tender.   Heart: regular rate with a normal S1 and S2.  There are no murmurs, gallops, or rubs. The PMI is not displaced.    Abdomen: good bowel sounds.  There is no guarding or rebound.   Extremities:  no clubbing, cyanosis, or edema.  The legs are without rashes.  The distal pulses are intact.  Neuro:  Cranial nerves II - XII are intact.  Motor and sensory functions are intact.   The gait is normal.  ECG: 06/13/2016:  Sinus brady at 54.  No ST or T wave changes.   Assessment / Plan:   1. Coronary artery disease-status post CABG. February 2007. He had a LIMA  to the LAD and a SVG to the right coronary artery. He's doing fairly well. I've encouraged him to stop smoking.  2. Hypertension  :   Continue meds. .  3. Hyperlipidemia:  Continue atorvastatin. Labs from yesterday are stable  4. Renal insufficiency  5. Peripheral Vascular disease:  S/p AAA stent graft repair (Oct. 2013)  s/p renal artery stenting  (Dec. 2013)  6. Paroxysmal atrial  fibrillation:  He is paroxysmal atrial fibrillation is well-controlled on the current dose of Multaq. Continue Xarelto.  Will see him in a year   Mertie Moores, MD  06/13/2016 2:27 PM    [place],  [place], [place]  [address] Pager [phone] Phone: [phone]; Fax: [phone]   [facility]  [address] [place], [place]  [address] [phone]   Fax [phone]

## 2016-06-13 NOTE — Patient Instructions (Signed)
Medication Instructions:  Your physician recommends that you continue on your current medications as directed. Please refer to the Current Medication list given to you today.   Labwork: NONE  Testing/Procedures: NONE  Follow-Up: Your physician wants you to follow-up in: 12 months with Dr. Cathie Olden. FAST LABS THE SAME DAY. You will receive a reminder letter in the mail two months in advance. If you don't receive a letter, please call our office to schedule the follow-up appointment.   Any Other Special Instructions Will Be Listed Below (If Applicable). FASTING LABS THE SAME DAY AS 1 YEAR APPT.    If you need a refill on your cardiac medications before your next appointment, please call your pharmacy.

## 2016-06-14 ENCOUNTER — Telehealth: Payer: Self-pay | Admitting: Cardiovascular Disease

## 2016-06-14 DIAGNOSIS — E781 Pure hyperglyceridemia: Secondary | ICD-10-CM

## 2016-06-14 MED ORDER — FENOFIBRATE 160 MG PO TABS: 160.0000 mg | ORAL_TABLET | Freq: Every day | ORAL | 11 refills | Status: DC

## 2016-06-14 NOTE — Telephone Encounter (Signed)
Reviewed lab results and plan of care with patient who verbalized understanding and agreement to start fenofibrate 160 mg.  I have scheduled him for lab work on 09/19/16 for 3 month recheck.  I advised him to call back with questions or concerns prior to follow up visits.  He thanked me for the call.

## 2016-06-14 NOTE — Telephone Encounter (Signed)
Pt is returning phone call from [place] about test results

## 2016-06-24 ENCOUNTER — Other Ambulatory Visit: Payer: Self-pay | Admitting: Cardiovascular Disease

## 2016-06-24 DIAGNOSIS — I480 Paroxysmal atrial fibrillation: Secondary | ICD-10-CM

## 2016-06-28 ENCOUNTER — Encounter: Payer: Self-pay | Admitting: Vascular Surgery

## 2016-06-28 LAB — VAS US CAROTID
LCCADSYS: -110 cm/s
LEFT ECA DIAS: -14 cm/s
LICAPDIAS: -28 cm/s
Left CCA dist dias: -33 cm/s
Left CCA prox dias: 26 cm/s
Left CCA prox sys: 79 cm/s
Left ICA dist dias: -36 cm/s
Left ICA dist sys: -92 cm/s
Left ICA prox sys: -107 cm/s
RCCADSYS: -78 cm/s
RCCAPDIAS: 20 cm/s
RCCAPSYS: 66 cm/s
RIGHT CCA MID DIAS: 26 cm/s
RIGHT ECA DIAS: -11 cm/s
RIGHT VERTEBRAL DIAS: -20 cm/s

## 2016-06-29 ENCOUNTER — Ambulatory Visit (INDEPENDENT_AMBULATORY_CARE_PROVIDER_SITE_OTHER): Payer: Managed Care, Other (non HMO) | Admitting: Vascular Surgery

## 2016-06-29 ENCOUNTER — Encounter: Payer: Self-pay | Admitting: Vascular Surgery

## 2016-06-29 ENCOUNTER — Ambulatory Visit (INDEPENDENT_AMBULATORY_CARE_PROVIDER_SITE_OTHER)
Admission: RE | Admit: 2016-06-29 | Discharge: 2016-06-29 | Disposition: A | Discharge status: Home / Self Care | Payer: Managed Care, Other (non HMO) | Source: Ambulatory Visit | Attending: Vascular Surgery | Admitting: Vascular Surgery

## 2016-06-29 ENCOUNTER — Ambulatory Visit (HOSPITAL_COMMUNITY)
Admission: RE | Admit: 2016-06-29 | Discharge: 2016-06-29 | Disposition: A | Discharge status: Home / Self Care | Payer: Managed Care, Other (non HMO) | Source: Ambulatory Visit | Attending: Vascular Surgery | Admitting: Vascular Surgery

## 2016-06-29 VITALS — BP 126/71 | HR 51 | Temp 97.7°F | Resp 16 | Ht 70.0 in | Wt 191.0 lb

## 2016-06-29 DIAGNOSIS — I714 Abdominal aortic aneurysm, without rupture, unspecified: Secondary | ICD-10-CM

## 2016-06-29 DIAGNOSIS — I6523 Occlusion and stenosis of bilateral carotid arteries: Secondary | ICD-10-CM | POA: Diagnosis not present

## 2016-06-29 DIAGNOSIS — I701 Atherosclerosis of renal artery: Secondary | ICD-10-CM

## 2016-06-29 DIAGNOSIS — Z48812 Encounter for surgical aftercare following surgery on the circulatory system: Secondary | ICD-10-CM | POA: Diagnosis not present

## 2016-06-29 NOTE — Progress Notes (Signed)
History of Present Illness:  Patient is a 65 year old male who presents for follow-up evaluation of AAA, left renal artery stenosis, bilateral mild carotid occlusive disease. He underwent Gore Excluder aneurysm stent graft repair in October 2013. He has also had a left renal artery stent and has known carotid stenosis of 40%. The patient denies new abdominal or back pain.  He denies any symptoms of TIA amaurosis or stroke. He has had a decrease in his blood pressure medications. He is now on Xarelto for atrial fibrillation. The patient's atherosclerotic risk factors remain tobacco abuse hypertension elevated cholesterol.   Otherwise his medical problems are stable and followed by his primary care physician. He has not noted any upward trend in his blood pressure or renal function.         Past Medical History    Diagnosis   Date    .   Anxiety      .   Renal insufficiency      .   Hypertension      .   Hypercholesteremia      .   AAA (abdominal aortic aneurysm)      .   Cancer   2013        skin cancer,   MOHS surgery    .   COPD (chronic obstructive pulmonary disease)      .   Myocardial infarction   1997    .   Complication of anesthesia          hard to sedate    .   Coronary artery disease      .   GERD (gastroesophageal reflux disease)      .   Atrial fibrillation          Review of Systems:   Neurologic: denies symptoms of TIA, amaurosis, or stroke Cardiac:denies shortness of breath or chest pain Pulmonary: denies cough or wheeze Abdomen: denies abdominal pain nausea or vomiting   Social History         Social History  . Marital Status: Married      Spouse Name: N/A  . Number of Children: 1 s  . Years of Education: N/A       Occupational History  . Not on file.         Social History Main Topics  . Smoking status: Current Every Day Smoker -- 0.50 packs/day for 44 years      Types: Cigarettes  . Smokeless tobacco: Never Used  . Alcohol Use: No  . Drug Use: No  .  Sexual Activity: Yes        Other Topics Concern  . Not on file    Social History Narrative               Allergies     Allergen    Reactions     .    Codeine    Other (See Comments)         GI upset ONLY in the cough syrup      .    Niacin    Hives               Current Outpatient Prescriptions on File Prior to Visit  Medication Sig Dispense Refill  . alprazolam (XANAX) 2 MG tablet Take 2 mg by mouth at bedtime.      [name] Kitchen aspirin 81 MG tablet Take 1 tablet (81 mg total) by mouth daily.      [name] Kitchen atorvastatin (  LIPITOR) 40 MG tablet Take 40 mg by mouth daily.        [name] Kitchen dronedarone (MULTAQ) 400 MG tablet Take 1 tablet (400 mg total) by mouth 2 (two) times daily with a meal. 180 tablet 1  . metoprolol (TOPROL-XL) 100 MG 24 hr tablet Take 100 mg by mouth daily.       . nitroGLYCERIN (NITROSTAT) 0.4 MG SL tablet Place 0.4 mg under the tongue every 5 (five) minutes as needed. Chest pain      . pantoprazole (PROTONIX) 40 MG tablet Take 40 mg by mouth daily.        . valsartan (DIOVAN) 320 MG tablet TAKE 1 TABLET BY MOUTH DAILY. 30 tablet 6  . XARELTO 20 MG TABS tablet TAKE 1 TABLET BY MOUTH DAILY WITH SUPPER 30 tablet 11  . ZETIA 10 MG tablet TAKE 1 TABLET (10 MG TOTAL) BY MOUTH DAILY. 30 tablet 1  . HYDROcodone-acetaminophen (NORCO) 5-325 MG per tablet Take 1 tablet by mouth every 6 (six) hours as needed for moderate pain. (Patient not taking: Reported on 06/17/2015) 30 tablet 0  . sulfamethoxazole-trimethoprim (BACTRIM,SEPTRA) 400-80 MG per tablet Take 1 tablet by mouth 2 (two) times daily.        No current facility-administered medications on file prior to visit.      Physical Examination    Vitals:   06/29/16 0936 06/29/16 0946  BP: 123/84 126/71  Pulse: (!) 51 (!) 51  Resp: 16   Temp: 97.7 F (36.5 C)   SpO2: 97%   Weight: 191 lb (86.6 kg)   Height: 5\' 10"  (1.778 m)    General:  Alert and oriented, no acute distress HEENT: Normal Neck: No carotid bruit 2+ carotid  pulses Pulmonary: Clear to auscultation bilaterally Cardiac: Regular Rate and Rhythm without murmur Abdomen: Soft, non-tender, non-distended, normal bowel sounds, no pulsatile mass Extremities: 2+ femoral dorsalis pedis pulses pulses bilaterally     DATA:     Abdominal aorta was 4.5 cm in diameter slightly decreased from diameter 4.8 cm in April 2016 and 5.9 cm preoperatively. Carotid duplex was less than 40% bilaterally.       ASSESSMENT:   Doing well status post Gore Excluder stent graft repair aneurysm.   no changes in blood pressure or renal function suggestive of recurrent renal artery stenosis. Minimal stenosis carotid arteries bilaterally     PLAN: Patient will return in 1 year for repeat renal artery duplex scan.He will need a repeat duplex of his aortic stent graft in one year. He has no significant change in his carotid duplex in the future of Aleve looking tobacco following this clinically.    Ruta Hinds, MD [facility] Office: [phone] Pager: [phone]   VASCULAR QUALITY INITIATIVE FOLLOW UP DATA:   Current smoker: [x  ] yes  [  ] no   Living status: [x  ]  Home  [  ] Nursing home  [  ] Homeless     MEDS:   ASA [  ] yes  [ x ] no- [x  ] medical reason pt on Xarelto for afib  [  ] non compliant   STATIN  [ x ] yes  [  ] no- [  ] medical reason  [  ] non compliant   Beta blocker [ x ] yes  [  ] no- [  ] medical reason  [  ] non compliant   ACE inhibitor [ x ] yes  [  ]  no- [  ] medical reason  [  ] non compliant   P2Y12 Antagonist [  ] none  [  ] clopidogrel-Plavix  [  ] ticlopidine-Ticlid   [  ] prasugrel-Effient  [  ] ticagrelor- Brilinta     Anticoagulant [  ] None  [  ] warfarin  [ x ] rivaroxaban-Xarelto [  ] dabigatran- Pradaxa   Current Max AAA = 44 mm   Endoleak:  [ x ] no  [  ] yes- [  ] type I  [  ] type II  [  ] type III  [  ] indeterminate   Number of new interventions: 0 -   Date:      Why:  [  ]  endoleak  [  ] limb occlusion    [  ] growth  [  ]  Migration   [  ] symtomatic/ rupture     Conversion to open repair: [  ] yes   [ x ] no   Date:    Other operation related to EVAR:  [  ] yes   [  x] no

## 2016-07-04 NOTE — Addendum Note (Signed)
Addended by: Mena Goes on: 07/04/2016 04:45 PM   Modules accepted: Orders

## 2016-07-09 ENCOUNTER — Other Ambulatory Visit: Payer: Self-pay | Admitting: Cardiovascular Disease

## 2016-07-10 NOTE — Telephone Encounter (Signed)
rivaroxaban (XARELTO) 20 MG TABS tablet  Medication  Date: 06/13/2016 Department: [facility] [address] Office Ordering/Authorizing: Thayer Headings, MD  Order Providers   Prescribing Provider Encounter Provider  Thayer Headings, MD Thayer Headings, MD  Medication Detail    Disp Refills Start End   rivaroxaban (XARELTO) 20 MG TABS tablet 90 tablet 3 06/13/2016    Sig: TAKE 1 TABLET BY MOUTH DAILY WITH SUPPER   E-Prescribing Status: Receipt confirmed by pharmacy (06/13/2016 2:43 PM EDT)   Pharmacy   [id number] - [place], [place]

## 2016-09-19 ENCOUNTER — Other Ambulatory Visit: Payer: Managed Care, Other (non HMO) | Admitting: *Deleted

## 2016-09-19 DIAGNOSIS — E781 Pure hyperglyceridemia: Secondary | ICD-10-CM

## 2016-09-19 NOTE — Addendum Note (Signed)
Addended by: Eulis Foster on: 09/19/2016 08:31 AM   Modules accepted: Orders

## 2016-09-20 LAB — COMPREHENSIVE METABOLIC PANEL
A/G RATIO: 2.3 — AB (ref 1.2–2.2)
ALT: 31 IU/L (ref 0–44)
AST: 28 IU/L (ref 0–40)
Albumin: 4.8 g/dL (ref 3.6–4.8)
Alkaline Phosphatase: 55 IU/L (ref 39–117)
BUN/Creatinine Ratio: 14 (ref 10–24)
BUN: 30 mg/dL — ABNORMAL HIGH (ref 8–27)
Bilirubin Total: 0.4 mg/dL (ref 0.0–1.2)
CALCIUM: 9.6 mg/dL (ref 8.6–10.2)
CO2: 21 mmol/L (ref 18–29)
CREATININE: 2.17 mg/dL — AB (ref 0.76–1.27)
Chloride: 93 mmol/L — ABNORMAL LOW (ref 96–106)
GFR, EST AFRICAN AMERICAN: 36 mL/min/{1.73_m2} — AB (ref >59)
GFR, EST NON AFRICAN AMERICAN: 31 mL/min/{1.73_m2} — AB (ref >59)
Globulin, Total: 2.1 g/dL (ref 1.5–4.5)
Glucose: 106 mg/dL — ABNORMAL HIGH (ref 65–99)
POTASSIUM: 4.5 mmol/L (ref 3.5–5.2)
Sodium: 135 mmol/L (ref 134–144)
TOTAL PROTEIN: 6.9 g/dL (ref 6.0–8.5)

## 2016-09-20 LAB — LIPID PANEL
CHOL/HDL RATIO: 4.2 ratio (ref 0.0–5.0)
Cholesterol, Total: 133 mg/dL (ref 100–199)
HDL: 32 mg/dL — AB (ref >39)
LDL CALC: 72 mg/dL (ref 0–99)
Triglycerides: 144 mg/dL (ref 0–149)
VLDL CHOLESTEROL CAL: 29 mg/dL (ref 5–40)

## 2016-09-22 ENCOUNTER — Telehealth: Payer: Self-pay | Admitting: Nurse Practitioner

## 2016-09-22 DIAGNOSIS — R7989 Other specified abnormal findings of blood chemistry: Secondary | ICD-10-CM

## 2016-09-22 NOTE — Telephone Encounter (Signed)
-----   Message from Thayer Headings, MD sent at 09/20/2016 12:55 PM EST ----- His creatinine is elevated compared to previous labs. ? Is he dehydrated. He is not on any new meds.   Lets recheck in 3 months.  He may want to speak to his medical doctor about this

## 2016-09-22 NOTE — Telephone Encounter (Signed)
Results reviewed with patient. He states he has not had any illness or reason for dehydration. He states he has a renal artery stent and was recently seen by Dr. Oneida Alar. He states Dr. Oneida Alar mentioned some scar tissue around the stent. I advised I will forward lab work to him and we will recheck in 1 month. I advised him to continue current medications. He verbalized understanding and agreement and thanked me for the call.

## 2016-10-19 ENCOUNTER — Other Ambulatory Visit: Payer: Managed Care, Other (non HMO) | Admitting: *Deleted

## 2016-10-19 DIAGNOSIS — R7989 Other specified abnormal findings of blood chemistry: Secondary | ICD-10-CM

## 2016-10-19 LAB — BASIC METABOLIC PANEL
BUN / CREAT RATIO: 12 (ref 10–24)
BUN: 20 mg/dL (ref 8–27)
CHLORIDE: 95 mmol/L — AB (ref 96–106)
CO2: 23 mmol/L (ref 18–29)
Calcium: 9.4 mg/dL (ref 8.6–10.2)
Creatinine, Ser: 1.69 mg/dL — ABNORMAL HIGH (ref 0.76–1.27)
GFR calc non Af Amer: 42 mL/min/{1.73_m2} — ABNORMAL LOW (ref >59)
GFR, EST AFRICAN AMERICAN: 49 mL/min/{1.73_m2} — AB (ref >59)
GLUCOSE: 94 mg/dL (ref 65–99)
Potassium: 4.3 mmol/L (ref 3.5–5.2)
SODIUM: 133 mmol/L — AB (ref 134–144)

## 2017-01-05 ENCOUNTER — Other Ambulatory Visit (HOSPITAL_COMMUNITY): Payer: Self-pay | Admitting: Internal Medicine

## 2017-01-05 ENCOUNTER — Ambulatory Visit (HOSPITAL_COMMUNITY)
Admission: RE | Admit: 2017-01-05 | Discharge: 2017-01-05 | Disposition: A | Discharge status: Home / Self Care | Payer: Managed Care, Other (non HMO) | Source: Ambulatory Visit | Attending: Internal Medicine | Admitting: Internal Medicine

## 2017-01-05 DIAGNOSIS — M25511 Pain in right shoulder: Secondary | ICD-10-CM

## 2017-01-08 ENCOUNTER — Other Ambulatory Visit (HOSPITAL_COMMUNITY): Payer: Self-pay | Admitting: Internal Medicine

## 2017-01-08 ENCOUNTER — Ambulatory Visit (HOSPITAL_COMMUNITY)
Admission: RE | Admit: 2017-01-08 | Discharge: 2017-01-08 | Disposition: A | Discharge status: Home / Self Care | Payer: Managed Care, Other (non HMO) | Source: Ambulatory Visit | Attending: Internal Medicine | Admitting: Internal Medicine

## 2017-01-08 DIAGNOSIS — R52 Pain, unspecified: Secondary | ICD-10-CM

## 2017-01-08 DIAGNOSIS — M25511 Pain in right shoulder: Secondary | ICD-10-CM | POA: Diagnosis not present

## 2017-01-19 ENCOUNTER — Ambulatory Visit (INDEPENDENT_AMBULATORY_CARE_PROVIDER_SITE_OTHER): Payer: Self-pay | Admitting: Orthopaedic Surgery

## 2017-01-22 ENCOUNTER — Encounter (INDEPENDENT_AMBULATORY_CARE_PROVIDER_SITE_OTHER): Payer: Self-pay | Admitting: Orthopaedic Surgery

## 2017-01-22 ENCOUNTER — Ambulatory Visit (INDEPENDENT_AMBULATORY_CARE_PROVIDER_SITE_OTHER): Payer: Managed Care, Other (non HMO) | Admitting: Orthopaedic Surgery

## 2017-01-22 DIAGNOSIS — M898X1 Other specified disorders of bone, shoulder: Secondary | ICD-10-CM

## 2017-01-22 NOTE — Progress Notes (Signed)
Office Visit Note   Patient: Tom [name]           Date of Birth: [birth year]           MRN: [id number] Visit Date: 01/22/2017              Requested by: Asencion Noble, MD [address] [place], [place] [address] PCP: Asencion Noble, MD   Assessment & Plan: Visit Diagnoses:  1. Pain of right scapula     Plan: MRI right scapula with contrast to rule out malignancy. Follow-up after MRI.  Follow-Up Instructions: Return in about 2 weeks (around 02/05/2017).   Orders:  Orders Placed This Encounter  Procedures  . MR SHOULDER RIGHT W CONTRAST   No orders of the defined types were placed in this encounter.     Procedures: No procedures performed   Clinical Data: No additional findings.   Subjective: Chief Complaint  Patient presents with  . Right Shoulder - Pain    Patient is a pleasant 66 year old gentleman who comes in with a right inferior scapular mass. This is been going on for a couple weeks. He does have chronic mid and low back pain. He did does state that he has had constitutional symptoms of fevers shows weight loss. He denies any personal history of cancer.    Review of Systems  Constitutional: Negative.   All other systems reviewed and are negative.    Objective: Vital Signs: There were no vitals taken for this visit.  Physical Exam  Constitutional: He is oriented to person, place, and time. He appears well-developed and well-nourished.  HENT:  Head: Normocephalic and atraumatic.  Eyes: Pupils are equal, round, and reactive to light.  Neck: Neck supple.  Pulmonary/Chest: Effort normal.  Abdominal: Soft.  Musculoskeletal: Normal range of motion.  Neurological: He is alert and oriented to person, place, and time.  Skin: Skin is warm.  Psychiatric: He has a normal mood and affect. His behavior is normal. Judgment and thought content normal.  Nursing note and vitals reviewed.   Ortho Exam Right scapular exam shows a firm 2 cm mass at the  inferior angle of the scapula. Is no overlying skin changes or lesions. He has full range of motion and use of shoulder Specialty Comments:  No specialty comments available.  Imaging: No results found.   PMFS History: Patient Active Problem List   Diagnosis Date Noted  . Atrial fibrillation ([place]) 07/11/2013  . HTN (hypertension) 02/26/2013  . Renal artery stenosis ([place]) 12/19/2012  . Aftercare following surgery of the circulatory system, [place] 07/11/2012  . Abdominal aneurysm without mention of rupture 06/06/2012  . AAA (abdominal aortic aneurysm) ([place]) 05/23/2012  . CAD (coronary artery disease) 01/27/2011  . Tobacco abuse 01/27/2011  . Hypercholesterolemia 01/27/2011  . DIARRHEA 06/17/2010   Past Medical History:  Diagnosis Date  . AAA (abdominal aortic aneurysm) ([place])   . Anxiety   . Cancer [facility]) 2013   skin cancer,   MOHS surgery  . Coronary artery disease   . Dental bridge present    upper  . Dental crowns present   . Family history of adverse reaction to anesthesia    pt's mother has hx. of post-op N/V  . Foreign body of finger of left hand 12/2014   index finger  . GERD (gastroesophageal reflux disease)   . Hypercholesteremia   . Hypertension    states under control with med., has been on med. x 20 yr.  . Myocardial infarction (  [place]) 1997  . PAF (paroxysmal atrial fibrillation) ([place])   . Wears glasses     Family History  Problem Relation Age of Onset  . Heart disease Father        before age 71  . Hypertension Father   . Hyperlipidemia Father   . Diabetes Father   . Heart attack Father   . Heart disease Mother   . Hypertension Mother   . Hyperlipidemia Mother   . Anesthesia problems Mother        hx. of post-op N/V  . AAA (abdominal aortic aneurysm) Maternal Uncle   . Cancer Maternal Uncle   . Hyperlipidemia Sister     Past Surgical History:  Procedure Laterality Date  . ABDOMINAL AORTIC ANEURYSM REPAIR  06/12/2012  . CARDIAC CATHETERIZATION   10/03/2005  . COLONOSCOPY    . CORONARY ARTERY BYPASS GRAFT  10/16/2005   LIMA to LAD saphenous vein to RCA  . FOREIGN BODY REMOVAL Left 01/08/2015   Procedure: EXCISION FOREIGN BODY ;  Surgeon: Daryll Brod, MD;  Location: [place];  Service: Orthopedics;  Laterality: Left;  ANESTHESIA:  GENERAL, IV REGIONAL UPPER ARM  . I&D EXTREMITY Left 11/12/2014   Procedure: IRRIGATION AND DEBRIDEMENT LEFT INDEX FINGER;  Surgeon: Daryll Brod, MD;  Location: [place];  Service: Orthopedics;  Laterality: Left;  . INCISION AND DRAINAGE ABSCESS Left 12/22/2014   Procedure: INCISION AND DRAINAGE ABSCESS LEFT INDEX FINGER;  Surgeon: Daryll Brod, MD;  Location: [place];  Service: Orthopedics;  Laterality: Left;  . PERCUTANEOUS STENT INTERVENTION Left 08/09/2012   Procedure: PERCUTANEOUS STENT INTERVENTION;  Surgeon: Elam Dutch, MD;  Location: [facility] CATH LAB;  Service: Cardiovascular;  Laterality: Left;  stent to lt renal artery  . RENAL ANGIOGRAM N/A 08/09/2012   Procedure: RENAL ANGIOGRAM;  Surgeon: Elam Dutch, MD;  Location[facility] CATH LAB;  Service: Cardiovascular;  Laterality: N/A;   Social History   Occupational History  . Not on file.   Social History Main Topics  . Smoking status: Current Every Day Smoker    Packs/day: 0.50    Years: 44.00    Types: Cigarettes  . Smokeless tobacco: Never Used  . Alcohol use No  . Drug use: No  . Sexual activity: Yes

## 2017-02-04 ENCOUNTER — Encounter (INDEPENDENT_AMBULATORY_CARE_PROVIDER_SITE_OTHER): Payer: Self-pay | Admitting: Orthopaedic Surgery

## 2017-02-04 ENCOUNTER — Ambulatory Visit
Admission: RE | Admit: 2017-02-04 | Discharge: 2017-02-04 | Disposition: A | Discharge status: Home / Self Care | Payer: Managed Care, Other (non HMO) | Source: Ambulatory Visit | Attending: Orthopaedic Surgery | Admitting: Orthopaedic Surgery

## 2017-02-04 DIAGNOSIS — M898X1 Other specified disorders of bone, shoulder: Secondary | ICD-10-CM

## 2017-02-04 MED ORDER — GADOBENATE DIMEGLUMINE 529 MG/ML IV SOLN
9.0000 mL | Freq: Once | INTRAVENOUS | Status: AC | PRN
  Administered 2017-02-04: 9 mL via INTRAVENOUS

## 2017-02-05 ENCOUNTER — Ambulatory Visit (INDEPENDENT_AMBULATORY_CARE_PROVIDER_SITE_OTHER): Payer: Managed Care, Other (non HMO) | Admitting: Orthopaedic Surgery

## 2017-02-05 ENCOUNTER — Encounter (INDEPENDENT_AMBULATORY_CARE_PROVIDER_SITE_OTHER): Payer: Self-pay | Admitting: Orthopaedic Surgery

## 2017-02-05 DIAGNOSIS — M898X1 Other specified disorders of bone, shoulder: Secondary | ICD-10-CM | POA: Diagnosis not present

## 2017-02-05 NOTE — Progress Notes (Signed)
Office Visit Note   Patient: Tom [name]           Date of Birth: 13-Nov-1950           MRN: [id number] Visit Date: 02/05/2017              Requested by: Asencion Noble, MD [address] [place], [place] [address] PCP: Asencion Noble, MD   Assessment & Plan: Visit Diagnoses:  1. Pain of right scapula     Plan: MRI shows subcutaneous simple lipoma without any worrisome changes. This is not symptomatically enough to warrant excision. In terms of his mid and low back pain recommend physical therapy and home exercise program. Follow-up with me as needed.  Follow-Up Instructions: Return if symptoms worsen or fail to improve.   Orders:  No orders of the defined types were placed in this encounter.  No orders of the defined types were placed in this encounter.     Procedures: No procedures performed   Clinical Data: No additional findings.   Subjective: Chief Complaint  Patient presents with  . Right Shoulder - Follow-up    Patient is following up today for his MRI of his shoulder. He is also on any of mid and low back pain is worse with activity. He denies any constant radicular symptoms.    Review of Systems   Objective: Vital Signs: There were no vitals taken for this visit.  Physical Exam  Constitutional: He is oriented to person, place, and time. He appears well-developed and well-nourished.  Pulmonary/Chest: Effort normal.  Abdominal: Soft.  Neurological: He is alert and oriented to person, place, and time.  Skin: Skin is warm.  Psychiatric: He has a normal mood and affect. His behavior is normal. Judgment and thought content normal.  Nursing note and vitals reviewed.   Ortho Exam Right shoulder exam is stable. No focal findings with his mid and low back exam. Specialty Comments:  No specialty comments available.  Imaging: No results found.   PMFS History: Patient Active Problem List   Diagnosis Date Noted  . Pain of right scapula 02/05/2017    . Atrial fibrillation ([place]) 07/11/2013  . HTN (hypertension) 02/26/2013  . Renal artery stenosis ([place]) 12/19/2012  . Aftercare following surgery of the circulatory system, [place] 07/11/2012  . Abdominal aneurysm without mention of rupture 06/06/2012  . AAA (abdominal aortic aneurysm) ([place]) 05/23/2012  . CAD (coronary artery disease) 01/27/2011  . Tobacco abuse 01/27/2011  . Hypercholesterolemia 01/27/2011  . DIARRHEA 06/17/2010   Past Medical History:  Diagnosis Date  . AAA (abdominal aortic aneurysm) ([place])   . Anxiety   . Cancer [facility]) 2013   skin cancer,   MOHS surgery  . Coronary artery disease   . Dental bridge present    upper  . Dental crowns present   . Family history of adverse reaction to anesthesia    pt's mother has hx. of post-op N/V  . Foreign body of finger of left hand 12/2014   index finger  . GERD (gastroesophageal reflux disease)   . Hypercholesteremia   . Hypertension    states under control with med., has been on med. x 20 yr.  . Myocardial infarction ([place]) 1997  . PAF (paroxysmal atrial fibrillation) ([place])   . Wears glasses     Family History  Problem Relation Age of Onset  . Heart disease Father        before age 110  . Hypertension Father   . Hyperlipidemia  Father   . Diabetes Father   . Heart attack Father   . Heart disease Mother   . Hypertension Mother   . Hyperlipidemia Mother   . Anesthesia problems Mother        hx. of post-op N/V  . AAA (abdominal aortic aneurysm) Maternal Uncle   . Cancer Maternal Uncle   . Hyperlipidemia Sister     Past Surgical History:  Procedure Laterality Date  . ABDOMINAL AORTIC ANEURYSM REPAIR  06/12/2012  . CARDIAC CATHETERIZATION  10/03/2005  . COLONOSCOPY    . CORONARY ARTERY BYPASS GRAFT  10/16/2005   LIMA to LAD saphenous vein to RCA  . FOREIGN BODY REMOVAL Left 01/08/2015   Procedure: EXCISION FOREIGN BODY ;  Surgeon: Daryll Brod, MD;  Location: [place];  Service: Orthopedics;  Laterality:  Left;  ANESTHESIA:  GENERAL, IV REGIONAL UPPER ARM  . I&D EXTREMITY Left 11/12/2014   Procedure: IRRIGATION AND DEBRIDEMENT LEFT INDEX FINGER;  Surgeon: Daryll Brod, MD;  Location: [place];  Service: Orthopedics;  Laterality: Left;  . INCISION AND DRAINAGE ABSCESS Left 12/22/2014   Procedure: INCISION AND DRAINAGE ABSCESS LEFT INDEX FINGER;  Surgeon: Daryll Brod, MD;  Location: [place];  Service: Orthopedics;  Laterality: Left;  . PERCUTANEOUS STENT INTERVENTION Left 08/09/2012   Procedure: PERCUTANEOUS STENT INTERVENTION;  Surgeon: Elam Dutch, MD;  Location: [facility] CATH LAB;  Service: Cardiovascular;  Laterality: Left;  stent to lt renal artery  . RENAL ANGIOGRAM N/A 08/09/2012   Procedure: RENAL ANGIOGRAM;  Surgeon: Elam Dutch, MD;  Location: [facility] CATH LAB;  Service: Cardiovascular;  Laterality: N/A;   Social History   Occupational History  . Not on file.   Social History Main Topics  . Smoking status: Current Every Day Smoker    Packs/day: 0.50    Years: 44.00    Types: Cigarettes  . Smokeless tobacco: Never Used  . Alcohol use No  . Drug use: No  . Sexual activity: Yes

## 2017-04-03 ENCOUNTER — Telehealth: Payer: Self-pay

## 2017-04-03 NOTE — Telephone Encounter (Signed)
The pt state that he cannot afford his Multaq or Xarelto any longer as he has come off of his wife's insurance and went on Medicare. He wants to know if there are less expensive medication he can take instead.  He is advised that I will do a tier exception on both Multaq and Xarelto and that if he is approved it will lower the cost of the medications and if not we can then discuss changing him to lower costing medications.  He verbalized understanding and thanked me for my help.  I have left 2 week supplys of both Multaq and Xarelto in the front office and he is aware he can pick them up at his convenience.

## 2017-04-04 NOTE — Telephone Encounter (Addendum)
Received a Tier Exception form via fax from [facility] to fill out for the pts Multaq.  Form completed and placed in Dr Elmarie Shiley mail bin.

## 2017-04-04 NOTE — Telephone Encounter (Signed)
Tier Exception form has been completed, signed and faxed back to [facility].

## 2017-04-04 NOTE — Telephone Encounter (Signed)
Received an approval letter via fax from [facility] stating that they have approved the pts Xarelto Tier Exception. Approval good until 08/20/2017.

## 2017-04-05 NOTE — Telephone Encounter (Signed)
Received fax from [facility] asking for additional clinical information regarding medication [place], we are trying to get a tier reduction.

## 2017-04-11 ENCOUNTER — Telehealth: Payer: Self-pay

## 2017-04-11 NOTE — Telephone Encounter (Signed)
Appeal for Multaq faxed to [facility].

## 2017-04-16 ENCOUNTER — Telehealth: Payer: Self-pay | Admitting: Cardiovascular Disease

## 2017-04-16 NOTE — Telephone Encounter (Signed)
New Message     Ref [id number]   Needs additional information for the Multaq , would not give any other information

## 2017-04-16 NOTE — Telephone Encounter (Signed)
The pt called the office today stating that his insurance sent him a letter stating that they have denied a tier exception on Multaq X 2. The pt has Medicare through [facility] and they will not consider a tier exception on brand name medications as they are already at the lowest cost possible.  The pt states that he cannot afford $400 per month for Multaq. He wants to know if there is anything else he can take in its place that is less expensive.  Please advise.

## 2017-04-16 NOTE — Telephone Encounter (Signed)
Pt need to talk with Jeani Hawking, RN about his Multaq and would like a call back. Pt states that he only has 2 days of medication left.  Please address. Thanks

## 2017-04-16 NOTE — Telephone Encounter (Signed)
Please see phone note from 8/14.

## 2017-04-17 NOTE — Telephone Encounter (Signed)
Fax received from [facility] stating that the pts Multaq has been approved from 08/21/16 until 09/20/16.

## 2017-04-17 NOTE — Telephone Encounter (Signed)
He has seen Dr. Lovena Le in the past who originally started the [place]. Will schedule him appt with Dr. Lovena Le or the afib clinic

## 2017-04-17 NOTE — Telephone Encounter (Signed)
I did an appeal over the phone for Multaq with Terri at [facility]. Per Karna Christmas we should receive answer today.

## 2017-04-17 NOTE — Telephone Encounter (Signed)
Unsure if the correct number was given to return this call as the message does not identify who I am calling. I left a message stating who I am and left this office phone number so they can call me back.

## 2017-05-08 DIAGNOSIS — L723 Sebaceous cyst: Secondary | ICD-10-CM | POA: Diagnosis not present

## 2017-05-08 DIAGNOSIS — D2261 Melanocytic nevi of right upper limb, including shoulder: Secondary | ICD-10-CM | POA: Diagnosis not present

## 2017-05-08 DIAGNOSIS — L720 Epidermal cyst: Secondary | ICD-10-CM | POA: Diagnosis not present

## 2017-05-08 DIAGNOSIS — L821 Other seborrheic keratosis: Secondary | ICD-10-CM | POA: Diagnosis not present

## 2017-05-08 DIAGNOSIS — D225 Melanocytic nevi of trunk: Secondary | ICD-10-CM | POA: Diagnosis not present

## 2017-05-08 DIAGNOSIS — Z85828 Personal history of other malignant neoplasm of skin: Secondary | ICD-10-CM | POA: Diagnosis not present

## 2017-05-08 DIAGNOSIS — L570 Actinic keratosis: Secondary | ICD-10-CM | POA: Diagnosis not present

## 2017-05-09 DIAGNOSIS — D485 Neoplasm of uncertain behavior of skin: Secondary | ICD-10-CM | POA: Diagnosis not present

## 2017-05-09 DIAGNOSIS — L720 Epidermal cyst: Secondary | ICD-10-CM | POA: Diagnosis not present

## 2017-05-09 DIAGNOSIS — Z85828 Personal history of other malignant neoplasm of skin: Secondary | ICD-10-CM | POA: Diagnosis not present

## 2017-05-16 DIAGNOSIS — H5203 Hypermetropia, bilateral: Secondary | ICD-10-CM | POA: Diagnosis not present

## 2017-05-16 DIAGNOSIS — H2513 Age-related nuclear cataract, bilateral: Secondary | ICD-10-CM | POA: Diagnosis not present

## 2017-05-16 DIAGNOSIS — D3131 Benign neoplasm of right choroid: Secondary | ICD-10-CM | POA: Diagnosis not present

## 2017-05-17 DIAGNOSIS — I480 Paroxysmal atrial fibrillation: Secondary | ICD-10-CM | POA: Diagnosis not present

## 2017-05-17 DIAGNOSIS — Z23 Encounter for immunization: Secondary | ICD-10-CM | POA: Diagnosis not present

## 2017-05-17 DIAGNOSIS — I10 Essential (primary) hypertension: Secondary | ICD-10-CM | POA: Diagnosis not present

## 2017-06-14 ENCOUNTER — Ambulatory Visit (INDEPENDENT_AMBULATORY_CARE_PROVIDER_SITE_OTHER): Payer: Medicare Other | Admitting: Cardiovascular Disease

## 2017-06-14 ENCOUNTER — Encounter: Payer: Self-pay | Admitting: Cardiovascular Disease

## 2017-06-14 VITALS — BP 122/70 | HR 48 | Ht 70.0 in | Wt 169.0 lb

## 2017-06-14 DIAGNOSIS — I480 Paroxysmal atrial fibrillation: Secondary | ICD-10-CM

## 2017-06-14 DIAGNOSIS — Z125 Encounter for screening for malignant neoplasm of prostate: Secondary | ICD-10-CM | POA: Diagnosis not present

## 2017-06-14 DIAGNOSIS — I2510 Atherosclerotic heart disease of native coronary artery without angina pectoris: Secondary | ICD-10-CM | POA: Diagnosis not present

## 2017-06-14 DIAGNOSIS — Z842 Family history of other diseases of the genitourinary system: Secondary | ICD-10-CM | POA: Diagnosis not present

## 2017-06-14 DIAGNOSIS — E782 Mixed hyperlipidemia: Secondary | ICD-10-CM | POA: Diagnosis not present

## 2017-06-14 LAB — BASIC METABOLIC PANEL
BUN / CREAT RATIO: 12 (ref 10–24)
BUN: 17 mg/dL (ref 8–27)
CO2: 22 mmol/L (ref 20–29)
CREATININE: 1.37 mg/dL — AB (ref 0.76–1.27)
Calcium: 9.2 mg/dL (ref 8.6–10.2)
Chloride: 104 mmol/L (ref 96–106)
GFR, EST AFRICAN AMERICAN: 62 mL/min/{1.73_m2} (ref >59)
GFR, EST NON AFRICAN AMERICAN: 54 mL/min/{1.73_m2} — AB (ref >59)
GLUCOSE: 91 mg/dL (ref 65–99)
Potassium: 5.2 mmol/L (ref 3.5–5.2)
SODIUM: 139 mmol/L (ref 134–144)

## 2017-06-14 LAB — HEPATIC FUNCTION PANEL
ALK PHOS: 84 IU/L (ref 39–117)
ALT: 15 IU/L (ref 0–44)
AST: 20 IU/L (ref 0–40)
Albumin: 4 g/dL (ref 3.6–4.8)
Bilirubin Total: 0.4 mg/dL (ref 0.0–1.2)
Bilirubin, Direct: 0.13 mg/dL (ref 0.00–0.40)
Total Protein: 6.6 g/dL (ref 6.0–8.5)

## 2017-06-14 LAB — LIPID PANEL
CHOLESTEROL TOTAL: 101 mg/dL (ref 100–199)
Chol/HDL Ratio: 3.5 ratio (ref 0.0–5.0)
HDL: 29 mg/dL — AB (ref >39)
LDL Calculated: 54 mg/dL (ref 0–99)
Triglycerides: 92 mg/dL (ref 0–149)
VLDL CHOLESTEROL CAL: 18 mg/dL (ref 5–40)

## 2017-06-14 LAB — PSA: PROSTATE SPECIFIC AG, SERUM: 0.9 ng/mL (ref 0.0–4.0)

## 2017-06-14 MED ORDER — DRONEDARONE HCL 400 MG PO TABS: 400.0000 mg | ORAL_TABLET | Freq: Two times a day (BID) | ORAL | 3 refills | Status: DC

## 2017-06-14 MED ORDER — METOPROLOL SUCCINATE ER 50 MG PO TB24: 50.0000 mg | ORAL_TABLET | Freq: Every day | ORAL | 3 refills | Status: DC

## 2017-06-14 MED ORDER — RIVAROXABAN 20 MG PO TABS: ORAL_TABLET | ORAL | 3 refills | Status: DC

## 2017-06-14 NOTE — Progress Notes (Signed)
Tom [name] Date of Birth  1951-04-16 [facility] [address]    [place], [place]  [address] [phone]  Fax  [phone]  Problem list: 1. Coronary artery disease-status post CABG. February 2007. He had a LIMA  to the LAD and a SVG to the right coronary artery. 2. Hypertension 3. Hyperlipidemia 4. Renal insufficiency 5. Peripheral Vascular disease:  S/p AAA stent graft repair ( Oct. 2013)  s/p renal artery stenting  ( Dec. 2013) 6. Paroxysmal atrial fibrillation:   History of Present Illness:  66 y.o. year-old gentleman with a history of coronary disease. He status post coronary artery bypass grafting in February 2007. He had a left internal mammary artery to the LAD and a saphenous vein graft to the right coronary artery. He also has a history of hypercholesterolemia, hypertension, and ongoing cigarette smoking. He's not had any episodes of chest pain, shortness breath, syncope, or presyncope.  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 He does not do any regular aerobic exercise. He's not had any recurrent episodes of chest pain. He tries to watch his salt intake.  He still smokes about a half a pack of cigarettes a day but has cut back from 2 packs a day. 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  November 22, 2012:  He has had an AAA stent graft repair and renal artery stenting since I last saw him.  He has had several episodes of chest pressure / tightness.  He found his BP and Hr  to be elevated.  He took and extra Toprol and ASA and felt better within an hour.  Oct. 24, 2014:  Tom [name] is having CP and palpitations .  Some relief with Inderal but occasionally has to take some NTG.  Symptoms seen to occur more at night - not during the day while he is working.  stil smoking 1/2 ppd.  Nov. 21, 2014:  Tom [name] is doing ok.  He was diagnosed with atrial fib by event monitor.  He has had several episodes of PAF - resolved  after several propranolol  Jan 02, 2014:  He is doing well.  Still have PAF - probably once a month.  Will last 4-6 hours.   He does not drink ETOH.   still smoking some .  Works as a Curator.   Oct. 24, 2016:  No CP ,  No recent episodes of PAF since starting the Multaq.  Was started by Dr. Lovena Le in Oct. 2015 Has not been taking his amlodipine due to leg swelling . Doing well.   BP is typcially ok  Getting some exercise Yard work  Retired from painting    Oct. 24, 2017:  Tom [name] is doing well.  Thinks he forgot his BP meds today .  Typical BP is well controlled.  Hs PAF - was started on Multaq.   Oct. 25, 2018:  Has lost 25 lbs. - purposeful weight loss Started cutting back on his BP meds Stopped Valsartan when it was recalled.  Has had orthostasis - once while using a chain saw. Several episodes of orthostasis getting up to the BR at night .   No CP , no dyspnea No syncope since adjusting his meds.  Cut his toprol XL to 50  Changed valsartan to Losartan  Has not had any a-fib since being on Multaq   Current Outpatient Prescriptions on File Prior to Visit  Medication Sig  Dispense Refill  . alprazolam (XANAX) 2 MG tablet Take 2 mg by mouth at bedtime.    [name] Kitchen aspirin 81 MG tablet Take 1 tablet (81 mg total) by mouth daily.    [name] Kitchen atorvastatin (LIPITOR) 40 MG tablet Take 1 tablet (40 mg total) by mouth daily. 90 tablet 3  . dronedarone (MULTAQ) 400 MG tablet Take 1 tablet (400 mg total) by mouth 2 (two) times daily with a meal. 180 tablet 3  . ezetimibe (ZETIA) 10 MG tablet TAKE 1 TABLET (10 MG TOTAL) BY MOUTH DAILY. 90 tablet 3  . metoprolol (TOPROL-XL) 100 MG 24 hr tablet Take 100 mg by mouth daily.     . nitroGLYCERIN (NITROSTAT) 0.4 MG SL tablet Place 1 tablet (0.4 mg total) under the tongue every 5 (five) minutes as needed. Chest pain 25 tablet 6  . pantoprazole (PROTONIX) 40 MG tablet Take 40 mg by mouth daily.      . rivaroxaban (XARELTO) 20 MG TABS tablet TAKE 1  TABLET BY MOUTH DAILY WITH SUPPER 90 tablet 3   No current facility-administered medications on file prior to visit.     Allergies  Allergen Reactions  . Niacin And Related Hives  . Codeine Other (See Comments)    GI upset ONLY in the cough syrup   . Niacin Hives    Past Medical History:  Diagnosis Date  . AAA (abdominal aortic aneurysm) ([place])   . Anxiety   . Cancer [facility]) 2013   skin cancer,   MOHS surgery  . Coronary artery disease   . Dental bridge present    upper  . Dental crowns present   . Family history of adverse reaction to anesthesia    pt's mother has hx. of post-op N/V  . Foreign body of finger of left hand 12/2014   index finger  . GERD (gastroesophageal reflux disease)   . Hypercholesteremia   . Hypertension    states under control with med., has been on med. x 20 yr.  . Myocardial infarction ([place]) 1997  . PAF (paroxysmal atrial fibrillation) ([place])   . Wears glasses     Past Surgical History:  Procedure Laterality Date  . ABDOMINAL AORTIC ANEURYSM REPAIR  06/12/2012  . CARDIAC CATHETERIZATION  10/03/2005  . COLONOSCOPY    . CORONARY ARTERY BYPASS GRAFT  10/16/2005   LIMA to LAD saphenous vein to RCA  . FOREIGN BODY REMOVAL Left 01/08/2015   Procedure: EXCISION FOREIGN BODY ;  Surgeon: Daryll Brod, MD;  Location: [place];  Service: Orthopedics;  Laterality: Left;  ANESTHESIA:  GENERAL, IV REGIONAL UPPER ARM  . I&D EXTREMITY Left 11/12/2014   Procedure: IRRIGATION AND DEBRIDEMENT LEFT INDEX FINGER;  Surgeon: Daryll Brod, MD;  Location: [place];  Service: Orthopedics;  Laterality: Left;  . INCISION AND DRAINAGE ABSCESS Left 12/22/2014   Procedure: INCISION AND DRAINAGE ABSCESS LEFT INDEX FINGER;  Surgeon: Daryll Brod, MD;  Location: [place];  Service: Orthopedics;  Laterality: Left;  . PERCUTANEOUS STENT INTERVENTION Left 08/09/2012   Procedure: PERCUTANEOUS STENT INTERVENTION;  Surgeon: Elam Dutch, MD;   Location: [facility] CATH LAB;  Service: Cardiovascular;  Laterality: Left;  stent to lt renal artery  . RENAL ANGIOGRAM N/A 08/09/2012   Procedure: RENAL ANGIOGRAM;  Surgeon: Elam Dutch, MD;  Location: [facility] CATH LAB;  Service: Cardiovascular;  Laterality: N/A;    History  Smoking Status  . Current Every Day Smoker  . Packs/day: 0.50  . Years:  44.00  . Types: Cigarettes  Smokeless Tobacco  . Never Used    History  Alcohol Use No    Family History  Problem Relation Age of Onset  . Heart disease Father        before age 10  . Hypertension Father   . Hyperlipidemia Father   . Diabetes Father   . Heart attack Father   . Heart disease Mother   . Hypertension Mother   . Hyperlipidemia Mother   . Anesthesia problems Mother        hx. of post-op N/V  . AAA (abdominal aortic aneurysm) Maternal Uncle   . Cancer Maternal Uncle   . Hyperlipidemia Sister     Reviw of Systems:  Reviewed in the HPI.  All other systems are negative.  Physical Exam: Blood pressure 122/70, pulse (!) 48, height 5\' 10"  (1.778 m), weight 169 lb (76.7 kg), SpO2 96 %.  GEN:  Well nourished, well developed in no acute distress HEENT: Normal NECK: No JVD; No carotid bruits LYMPHATICS: No lymphadenopathy CARDIAC: RR,  , no murmurs, rubs, gallops RESPIRATORY:  Clear to auscultation without rales, wheezing or rhonchi  ABDOMEN: Soft, non-tender, non-distended MUSCULOSKELETAL:  No edema; No deformity  SKIN: Warm and dry NEUROLOGIC:  Alert and oriented x 3   ECG: Oct. 25, 2018   - sinus brady at 48.  No St changes.   Assessment / Plan:   1. Coronary artery disease-status post CABG.  No angina  Lipids have been well controlled. Check lipids today Advised him to stop smoking  2. Hypertension  :   He has cut his BP meds after weight loss. BP is well controlled.  3. Hyperlipidemia:   Check lipids and bmp, liver enz today   4. Renal insufficiency  5. Peripheral Vascular disease:  S/p AAA stent graft  repair (Oct. 2013)  s/p renal artery stenting  (Dec. 2013)  6. Paroxysmal atrial fibrillation:  No recent episodes of AF. Continue muiltaq. Continue DOAC - on xarelto for now    Will see him in a year   Mertie Moores, MD  06/14/2017 8:17 AM    [place],  [place], [place]  [address] Pager [phone] Phone: [phone]; Fax: [phone]

## 2017-06-14 NOTE — Patient Instructions (Signed)
Medication Instructions:  Your physician recommends that you continue on your current medications as directed. Please refer to the Current Medication list given to you today.   Labwork: TODAY - PSA, cholesterol, basic metabolic panel, liver panel   Testing/Procedures: None Ordered   Follow-Up: Your physician wants you to follow-up in: 1 year with Dr. Acie Fredrickson.  You will receive a reminder letter in the mail two months in advance. If you don't receive a letter, please call our office to schedule the follow-up appointment.   If you need a refill on your cardiac medications before your next appointment, please call your pharmacy.   Thank you for choosing [facility]! Christen Bame, RN [phone]

## 2017-07-05 ENCOUNTER — Encounter (HOSPITAL_COMMUNITY): Payer: Managed Care, Other (non HMO)

## 2017-07-05 ENCOUNTER — Ambulatory Visit: Payer: Managed Care, Other (non HMO) | Admitting: Vascular Surgery

## 2017-07-05 ENCOUNTER — Other Ambulatory Visit (HOSPITAL_COMMUNITY): Payer: Managed Care, Other (non HMO)

## 2017-07-23 ENCOUNTER — Telehealth: Payer: Self-pay

## 2017-07-23 NOTE — Telephone Encounter (Signed)
The pt called the office stating that his [place] has gone up in price even though we got a tier exception on both earlier this year. I re-faxed the tier exception approval letters to the pts pharmacy, [facility], as the pt states that they advised him that they were not notified. I also advised the pt to contact [employer] to find out why his cost has gone up.   He called me back and stated that [facility] advised him he is in the mega gap (Must be the donut hole) and that his cost will go down after the 1st of the new year. He is requesting samples of Multaq and Xarelto until the 1st of the year. I advised him that I will leave him 2 weeks worth of both medications in the front office for him to pick up and that he should contact his pharmacy to get a partal refill which should get him to the new year. He verbalized understanding and thanked me for helping him.

## 2017-08-22 ENCOUNTER — Telehealth: Payer: Self-pay

## 2017-08-22 NOTE — Telephone Encounter (Signed)
**Note De-Identified Kipp Shank Obfuscation** The pt states that his Multaq is $400 and he is concerned that it will be to expensive for him to afford.  I asked if he has met his deductible as this is the 2nd day of the new year and he stated that he has not.  I have advised him that once he pays his deductible the price will come down and that if it is still to expensive we will attempt a tier exception as we did in 2018.  I also advised him to call [employer] to get a better idea of how much his Multaq and Xarelto will cost after he pays his deductible and that they will be better able to answer his insurance questions.  He verbalized understanding and thanked me for my help.

## 2017-08-22 NOTE — Telephone Encounter (Signed)
The pt called back and stated that his mail in pharmacy advised him that they are faxing [place] tier exception forms for his Multaq and Xarelto in hopes of lowering the cost.

## 2017-08-23 ENCOUNTER — Other Ambulatory Visit: Payer: Self-pay | Admitting: *Deleted

## 2017-08-23 DIAGNOSIS — I480 Paroxysmal atrial fibrillation: Secondary | ICD-10-CM

## 2017-08-23 MED ORDER — DRONEDARONE HCL 400 MG PO TABS: 400.0000 mg | ORAL_TABLET | Freq: Two times a day (BID) | ORAL | 3 refills | Status: DC

## 2017-08-29 ENCOUNTER — Telehealth: Payer: Self-pay

## 2017-08-29 MED ORDER — RIVAROXABAN 20 MG PO TABS: ORAL_TABLET | ORAL | 3 refills | Status: DC

## 2017-08-29 NOTE — Telephone Encounter (Signed)
The pt states that he has met his deductible and switched his Multaq to [facility] mail order pharmacy as advised by [facility] to help lower the cost of his medications.  He states that he was advised by [facility] that even though he met his deductible and switched his refills to [facility] mail order pharmacy that his cost for Multaq will be over $400 for a 90 day supply.  He is requesting that I also send his Xarelto to [facility] mail order pharmacy as he was advised that the cost would go down on that as well. He states that he is going to call [facility] when our call ended to find out how much his Xarelto will cost. I have sent his Xarelto refill to [facility] mail in pharmacy.  I have advised the pt that I will attempt a tier exception on both Multaq and Xarelto and that if it is approved his cost will come down.  He requested Xarelto and Multaq samples as he is concerned that his refills will not arrive by mail by the time he runs out. He is advised that I am leaving him a 2 week supply of Xarelto sample and a 6 day supply of Multaq samples in our front office and that he may pick up at his convenience.  He verbalized understanding and thanked me for my help.

## 2017-08-30 NOTE — Telephone Encounter (Signed)
Faxed tier exception forms signed by Dr Acie Fredrickson to [facility] on his [place].

## 2017-08-30 NOTE — Telephone Encounter (Signed)
Received tier exceptions forms from [facility] for patients [place], completed and awaiting Dr Lanny Hurst signature, he is in office today till 12:00pm.

## 2017-08-31 ENCOUNTER — Other Ambulatory Visit: Payer: Self-pay

## 2017-09-03 NOTE — Telephone Encounter (Signed)
Received a denial via fax from [facility] on the pts [place]. Reason: [employer] follows Medicare rules. You can request a tiering exception so long as there is a lower cost brand name drug for treating the same condition. There are no lower cost brand name to treat your condition on your plans formulary. You do not qualify for a tiering exception per Medicare rules.  I have filled out appeal forms for both Xarelto and Multaq and faxed them back to [facility].

## 2017-09-07 NOTE — Telephone Encounter (Signed)
**Note De-Identified Makarios Madlock Obfuscation** Denial received on the appeal for Xarelto Aaylah Pokorny fax from [facility] Denial reason: After careful review of the information, we determined we were right to deny coverage of the Xarelto 20 mg tablet. This brand name drug is covered for you at the cost-sharing tier listed in your 2019 drug guide. Medicare changed the tier exception rules for the 2019 plan year. Since there isn't an alternative brand name drug for your condition in a lower cost-sharing tier, a tier exception is not permitted.  I have notified [employer] mail in Pharmacy.

## 2017-09-12 NOTE — Telephone Encounter (Signed)
Denial received via fax from [facility] on the appeal for Multaq. Denial reason:  After careful review of the information, we determined we were right to deny coverage of the Multaq 400 mg tablet. This brand name drug is covered for you at the cost-sharing tier listed in your 2019 drug guide. Medicare changed the tier exception rules for the 2019 plan year. Since there isn't an alternative brand name drug for your condition in a lower cost-sharing tier, a tier exception is not permitted.  I have notified [employer] mail in Pharmacy.

## 2017-09-13 ENCOUNTER — Ambulatory Visit (HOSPITAL_COMMUNITY)
Admission: RE | Admit: 2017-09-13 | Discharge: 2017-09-13 | Disposition: A | Discharge status: Home / Self Care | Payer: Medicare Other | Source: Ambulatory Visit | Attending: Vascular Surgery | Admitting: Vascular Surgery

## 2017-09-13 ENCOUNTER — Encounter: Payer: Self-pay | Admitting: Vascular Surgery

## 2017-09-13 ENCOUNTER — Ambulatory Visit (INDEPENDENT_AMBULATORY_CARE_PROVIDER_SITE_OTHER): Payer: Medicare Other | Admitting: Vascular Surgery

## 2017-09-13 ENCOUNTER — Ambulatory Visit (INDEPENDENT_AMBULATORY_CARE_PROVIDER_SITE_OTHER)
Admission: RE | Admit: 2017-09-13 | Discharge: 2017-09-13 | Disposition: A | Discharge status: Home / Self Care | Payer: Medicare Other | Source: Ambulatory Visit | Attending: Vascular Surgery | Admitting: Vascular Surgery

## 2017-09-13 ENCOUNTER — Other Ambulatory Visit: Payer: Self-pay

## 2017-09-13 VITALS — BP 122/76 | HR 53 | Temp 97.8°F | Resp 18 | Ht 70.0 in | Wt 165.0 lb

## 2017-09-13 DIAGNOSIS — I6523 Occlusion and stenosis of bilateral carotid arteries: Secondary | ICD-10-CM | POA: Diagnosis not present

## 2017-09-13 DIAGNOSIS — I714 Abdominal aortic aneurysm, without rupture, unspecified: Secondary | ICD-10-CM

## 2017-09-13 DIAGNOSIS — Z48812 Encounter for surgical aftercare following surgery on the circulatory system: Secondary | ICD-10-CM

## 2017-09-13 DIAGNOSIS — I701 Atherosclerosis of renal artery: Secondary | ICD-10-CM | POA: Diagnosis not present

## 2017-09-13 DIAGNOSIS — N281 Cyst of kidney, acquired: Secondary | ICD-10-CM | POA: Insufficient documentation

## 2017-09-13 LAB — VAS US CAROTID
LCCADDIAS: 38 cm/s
LCCADSYS: 151 cm/s
LEFT ECA DIAS: -8 cm/s
LEFT VERTEBRAL DIAS: 13 cm/s
LICAPDIAS: -23 cm/s
Left CCA prox dias: 30 cm/s
Left CCA prox sys: 98 cm/s
Left ICA dist dias: -23 cm/s
Left ICA dist sys: -84 cm/s
Left ICA prox sys: -92 cm/s
RCCAPDIAS: 18 cm/s
RCCAPSYS: 82 cm/s
RIGHT CCA MID DIAS: 18 cm/s
RIGHT ECA DIAS: -11 cm/s
RIGHT VERTEBRAL DIAS: 14 cm/s
Right cca dist sys: -108 cm/s

## 2017-09-13 NOTE — Progress Notes (Signed)
Patient name: Tom [name] MRN: [id number] DOB: 02/09/1951 Sex: male   HPI: Tom [name] is a 67 y.o. male who returns for follow-up today for examination of his carotid artery occlusive disease as well as follow-up for prior abdominal aortic aneurysm stent grafting and renal artery stent.  He denies any significant changes in his blood pressure.  He did have an elevated creatinine of 2.21 May 2016.  This improved to 1.24 May 2017.  He thinks this was due to medication changes.  He is currently on 2 antihypertensives.  He is also on Xarelto for atrial fibrillation.  He is very active overall.  Unfortunately he continues to smoke.  He was counseled against this today.  Other medical problems include coronary artery disease elevated cholesterol hypertension.  All of these are currently stable.  He is also on aspirin and a statin.  Past Medical History:  Diagnosis Date  . AAA (abdominal aortic aneurysm) ([place])   . Anxiety   . Cancer [facility]) 2013   skin cancer,   MOHS surgery  . Coronary artery disease   . Dental bridge present    upper  . Dental crowns present   . Family history of adverse reaction to anesthesia    pt's mother has hx. of post-op N/V  . Foreign body of finger of left hand 12/2014   index finger  . GERD (gastroesophageal reflux disease)   . Hypercholesteremia   . Hypertension    states under control with med., has been on med. x 20 yr.  . Myocardial infarction ([place]) 1997  . PAF (paroxysmal atrial fibrillation) ([place])   . Wears glasses    Past Surgical History:  Procedure Laterality Date  . ABDOMINAL AORTIC ANEURYSM REPAIR  06/12/2012  . CARDIAC CATHETERIZATION  10/03/2005  . COLONOSCOPY    . CORONARY ARTERY BYPASS GRAFT  10/16/2005   LIMA to LAD saphenous vein to RCA  . FOREIGN BODY REMOVAL Left 01/08/2015   Procedure: EXCISION FOREIGN BODY ;  Surgeon: Daryll Brod, MD;  Location: [place];  Service: Orthopedics;  Laterality: Left;  ANESTHESIA:   GENERAL, IV REGIONAL UPPER ARM  . I&D EXTREMITY Left 11/12/2014   Procedure: IRRIGATION AND DEBRIDEMENT LEFT INDEX FINGER;  Surgeon: Daryll Brod, MD;  Location: [place];  Service: Orthopedics;  Laterality: Left;  . INCISION AND DRAINAGE ABSCESS Left 12/22/2014   Procedure: INCISION AND DRAINAGE ABSCESS LEFT INDEX FINGER;  Surgeon: Daryll Brod, MD;  Location: [place];  Service: Orthopedics;  Laterality: Left;  . PERCUTANEOUS STENT INTERVENTION Left 08/09/2012   Procedure: PERCUTANEOUS STENT INTERVENTION;  Surgeon: Elam Dutch, MD;  Location: [facility] CATH LAB;  Service: Cardiovascular;  Laterality: Left;  stent to lt renal artery  . RENAL ANGIOGRAM N/A 08/09/2012   Procedure: RENAL ANGIOGRAM;  Surgeon: Elam Dutch, MD;  Location: [facility] CATH LAB;  Service: Cardiovascular;  Laterality: N/A;    Family History  Problem Relation Age of Onset  . Heart disease Father        before age 24  . Hypertension Father   . Hyperlipidemia Father   . Diabetes Father   . Heart attack Father   . Heart disease Mother   . Hypertension Mother   . Hyperlipidemia Mother   . Anesthesia problems Mother        hx. of post-op N/V  . AAA (abdominal aortic aneurysm) Maternal Uncle   . Cancer Maternal Uncle   . Hyperlipidemia Sister  SOCIAL HISTORY: Social History   Socioeconomic History  . Marital status: Married    Spouse name: Not on file  . Number of children: 1 s  . Years of education: Not on file  . Highest education level: Not on file  Social Needs  . Financial resource strain: Not on file  . Food insecurity - worry: Not on file  . Food insecurity - inability: Not on file  . Transportation needs - medical: Not on file  . Transportation needs - non-medical: Not on file  Occupational History  . Not on file  Tobacco Use  . Smoking status: Current Every Day Smoker    Packs/day: 0.50    Years: 44.00    Pack years: 22.00    Types: Cigarettes  . Smokeless  tobacco: Never Used  Substance and Sexual Activity  . Alcohol use: No    Alcohol/week: 0.0 oz  . Drug use: No  . Sexual activity: Yes  Other Topics Concern  . Not on file  Social History Narrative  . Not on file    Allergies  Allergen Reactions  . Niacin And Related Hives  . Codeine Other (See Comments)    GI upset ONLY in the cough syrup   . Niacin Hives    Current Outpatient Medications  Medication Sig Dispense Refill  . alprazolam (XANAX) 2 MG tablet Take 2 mg by mouth at bedtime.    [name] Kitchen aspirin 81 MG tablet Take 1 tablet (81 mg total) by mouth daily.    [name] Kitchen atorvastatin (LIPITOR) 40 MG tablet Take 1 tablet (40 mg total) by mouth daily. 90 tablet 3  . dronedarone (MULTAQ) 400 MG tablet Take 1 tablet (400 mg total) by mouth 2 (two) times daily with a meal. 180 tablet 3  . losartan (COZAAR) 50 MG tablet Take 50 mg by mouth daily.    . metoprolol succinate (TOPROL-XL) 50 MG 24 hr tablet Take 1 tablet (50 mg total) by mouth daily. Take with or immediately following a meal. 90 tablet 3  . nitroGLYCERIN (NITROSTAT) 0.4 MG SL tablet Place 1 tablet (0.4 mg total) under the tongue every 5 (five) minutes as needed. Chest pain 25 tablet 6  . pantoprazole (PROTONIX) 40 MG tablet Take 40 mg by mouth daily.      . rivaroxaban (XARELTO) 20 MG TABS tablet TAKE 1 TABLET BY MOUTH DAILY WITH SUPPER 90 tablet 3   No current facility-administered medications for this visit.     ROS:   General:  No weight loss, Fever, chills  HEENT: No recent headaches, no nasal bleeding, no visual changes, no sore throat  Neurologic: No dizziness, blackouts, seizures. No recent symptoms of stroke or mini- stroke. No recent episodes of slurred speech, or temporary blindness.  Cardiac: No recent episodes of chest pain/pressure, no shortness of breath at rest.  No shortness of breath with exertion.  + history of atrial fibrillation or irregular heartbeat  Vascular: No history of rest pain in feet.  No history of  claudication.  No history of non-healing ulcer, No history of DVT   Pulmonary: No home oxygen, no productive cough, no hemoptysis,  No asthma or wheezing  Musculoskeletal:  [ ]  Arthritis, [ ]  Low back pain,  [ ]  Joint pain  Hematologic:No history of hypercoagulable state.  No history of easy bleeding.  No history of anemia  Gastrointestinal: No hematochezia or melena,  No gastroesophageal reflux, no trouble swallowing  Urinary: [X]  chronic Kidney disease, [ ]  on HD - [ ]   MWF or [ ]  TTHS, [ ]  Burning with urination, [ ]  Frequent urination, [ ]  Difficulty urinating;   Skin: No rashes  Psychological: No history of anxiety,  No history of depression   Physical Examination  Vitals:   09/13/17 1022  BP: 122/76  Pulse: (!) 53  Resp: 18  Temp: 97.8 F (36.6 C)  TempSrc: Oral  SpO2: 99%  Weight: 165 lb (74.8 kg)  Height: 5\' 10"  (1.778 m)    Body mass index is 23.68 kg/m.  General:  Alert and oriented, no acute distress HEENT: Normal Neck: No bruit or JVD Pulmonary: Clear to auscultation bilaterally Cardiac: Regular Rate and Rhythm without murmur Abdomen: Soft, non-tender, non-distended, no mass, no scars Skin: No rash Extremity Pulses:  2+ radial, brachial, femoral, dorsalis pedis, posterior tibial pulses bilaterally Musculoskeletal: No deformity or edema  Neurologic: Upper and lower extremity motor 5/5 and symmetric  DATA:  Patient had a renal duplex today which showed increased velocities of the left renal artery stent up to 405 cm/s compared to 350 cm/s October 2016.  Right renal artery also had mild stenosis.  Renal length on the left side was 10 cm.  Right kidney length was 12 cm.  Sound to evaluate his stent graft.  This shows his aortic diameter is currently 4 x 3.6 which is unchanged from his visit in 2017.  He also had a carotid duplex exam which showed less than 40% stenosis bilaterally.  ASSESSMENT: #1 abdominal aortic aneurysm well sealed with stent graft  repair no significant change stable repeat aortic ultrasound 1 year  2.  Increasing velocities left renal artery stent with moderate right renal artery stenosis.  Currently the patient's blood pressure is well controlled with 2 medications.  He has actually had improvement of his renal function over the last year rather than decline.  To see if the left renal artery is stable we will repeat his duplex scan in 3 months time.  If he has any decline of renal function or worsening of his hypertension we will consider intervention at that point.  3.  No significant carotid stenosis at this point I do not believe he needs further carotid duplex ultrasound unless he has symptoms or some new findings to suggest repeating the exam.   Plan: The patient will follow up with me in 3 months time with a repeat renal artery duplex.  He will need a repeat aortic aneurysm ultrasound in 1 year.   Ruta Hinds, MD [facility] [place] Office: [phone] Pager: [phone]

## 2017-09-17 NOTE — Addendum Note (Signed)
Addended by: Lianne Cure A on: 09/17/2017 02:57 PM   Modules accepted: Orders

## 2017-10-29 DIAGNOSIS — Z6824 Body mass index (BMI) 24.0-24.9, adult: Secondary | ICD-10-CM | POA: Diagnosis not present

## 2017-10-29 DIAGNOSIS — I10 Essential (primary) hypertension: Secondary | ICD-10-CM | POA: Diagnosis not present

## 2017-10-29 DIAGNOSIS — Z23 Encounter for immunization: Secondary | ICD-10-CM | POA: Diagnosis not present

## 2017-10-29 DIAGNOSIS — I480 Paroxysmal atrial fibrillation: Secondary | ICD-10-CM | POA: Diagnosis not present

## 2017-12-20 ENCOUNTER — Ambulatory Visit (HOSPITAL_COMMUNITY)
Admission: RE | Admit: 2017-12-20 | Discharge: 2017-12-20 | Disposition: A | Discharge status: Home / Self Care | Payer: Medicare Other | Source: Ambulatory Visit | Attending: Vascular Surgery | Admitting: Vascular Surgery

## 2017-12-20 ENCOUNTER — Encounter: Payer: Self-pay | Admitting: Vascular Surgery

## 2017-12-20 ENCOUNTER — Other Ambulatory Visit: Payer: Self-pay

## 2017-12-20 ENCOUNTER — Ambulatory Visit (INDEPENDENT_AMBULATORY_CARE_PROVIDER_SITE_OTHER): Payer: Medicare Other | Admitting: Vascular Surgery

## 2017-12-20 VITALS — BP 127/79 | HR 47 | Temp 97.0°F | Resp 16 | Ht 70.0 in | Wt 171.5 lb

## 2017-12-20 DIAGNOSIS — I10 Essential (primary) hypertension: Secondary | ICD-10-CM | POA: Diagnosis not present

## 2017-12-20 DIAGNOSIS — N281 Cyst of kidney, acquired: Secondary | ICD-10-CM | POA: Diagnosis not present

## 2017-12-20 DIAGNOSIS — I714 Abdominal aortic aneurysm, without rupture, unspecified: Secondary | ICD-10-CM

## 2017-12-20 DIAGNOSIS — E785 Hyperlipidemia, unspecified: Secondary | ICD-10-CM | POA: Diagnosis not present

## 2017-12-20 DIAGNOSIS — I708 Atherosclerosis of other arteries: Secondary | ICD-10-CM | POA: Diagnosis not present

## 2017-12-20 DIAGNOSIS — F17200 Nicotine dependence, unspecified, uncomplicated: Secondary | ICD-10-CM | POA: Diagnosis not present

## 2017-12-20 DIAGNOSIS — I701 Atherosclerosis of renal artery: Secondary | ICD-10-CM

## 2017-12-20 NOTE — Progress Notes (Signed)
Patient is a 67 year old male who returns for follow-up today.  He has previously undergone left renal artery stenting 2013.  He has had some recurrent stenosis of the left renal artery and returns for follow-up regarding this today.  He has had an elevated creatinine in the past as high as 2.1 in October 2017.  This improved to 1.24 May 2017.  He has not had a creatinine checked since then.  He currently remains on 2 antihypertensives.  He is on Xarelto for atrial fibrillation.  He remains very active overall.  He had repair of an infrarenal abdominal aortic aneurysm with a Gore excluder stent graft also in 2013.  He denies any abdominal or back pain.  He denies any claudication symptoms.  He is on losartan and metoprolol for blood pressure control.  He is also on aspirin and a statin.  He is on dronedarone for his atrial fibrillation.  Review of systems: He denies shortness of breath.  He denies chest pain.  Current Outpatient Medications on File Prior to Visit  Medication Sig Dispense Refill  . alprazolam (XANAX) 2 MG tablet Take 2 mg by mouth at bedtime.    [name] Kitchen aspirin 81 MG tablet Take 1 tablet (81 mg total) by mouth daily.    [name] Kitchen atorvastatin (LIPITOR) 40 MG tablet Take 1 tablet (40 mg total) by mouth daily. 90 tablet 3  . dronedarone (MULTAQ) 400 MG tablet Take 1 tablet (400 mg total) by mouth 2 (two) times daily with a meal. 180 tablet 3  . losartan (COZAAR) 50 MG tablet Take 50 mg by mouth daily.    . metoprolol succinate (TOPROL-XL) 50 MG 24 hr tablet Take 1 tablet (50 mg total) by mouth daily. Take with or immediately following a meal. 90 tablet 3  . nitroGLYCERIN (NITROSTAT) 0.4 MG SL tablet Place 1 tablet (0.4 mg total) under the tongue every 5 (five) minutes as needed. Chest pain 25 tablet 6  . pantoprazole (PROTONIX) 40 MG tablet Take 40 mg by mouth daily.      . rivaroxaban (XARELTO) 20 MG TABS tablet TAKE 1 TABLET BY MOUTH DAILY WITH SUPPER 90 tablet 3   No current  facility-administered medications on file prior to visit.      Physical exam:  Vitals:   12/20/17 0916  BP: 127/79  Pulse: (!) 47  Resp: 16  Temp: (!) 97 F (36.1 C)  TempSrc: Oral  SpO2: 100%  Weight: 171 lb 8 oz (77.8 kg)  Height: 5' 10" (1.778 m)    Neck: No carotid bruits  Chest: Clear to auscultation bilaterally  Cardiac: Regular rate and rhythm  Abdomen: Soft nontender no pulsatile mass, no bruit  Extremities: 2+ femoral pulses  Data: Patient had a renal duplex exam which showed increased velocities in the proximal left renal artery of 4 34 cm/s compared to 405 6 months ago.  No change in renal mass or cortical thickness.  Assessment: Asymptomatic most likely recurrent stenosis left renal artery.  He has had no increase in his blood pressure over time.  He has no new documented decline in renal function.  He certainly has no change in renal mass on ultrasound today.  He has had no changes in his blood pressure medications.  Plan: Patient will follow-up with our nurse practitioner in 6 months time with a repeat renal duplex.  If he remains stable with no changes in his blood pressure no significant changes in his velocities renal mass or renal function I would be  inclined currently to continue to follow this with serial ultrasound exam.  If any above criteria are met we would consider an arteriogram.  He also needs a repeat ultrasound of his abdominal aortic stent graft in January 2020.  Ruta Hinds, MD [facility] [place] Office: [phone] Pager: [phone]

## 2018-01-22 ENCOUNTER — Other Ambulatory Visit (HOSPITAL_COMMUNITY): Payer: Self-pay | Admitting: Internal Medicine

## 2018-01-22 ENCOUNTER — Ambulatory Visit (HOSPITAL_COMMUNITY)
Admission: RE | Admit: 2018-01-22 | Discharge: 2018-01-22 | Disposition: A | Discharge status: Home / Self Care | Payer: Medicare Other | Source: Ambulatory Visit | Attending: Internal Medicine | Admitting: Internal Medicine

## 2018-01-22 DIAGNOSIS — M25562 Pain in left knee: Secondary | ICD-10-CM | POA: Diagnosis not present

## 2018-01-22 DIAGNOSIS — M25462 Effusion, left knee: Secondary | ICD-10-CM | POA: Diagnosis not present

## 2018-01-22 DIAGNOSIS — M1712 Unilateral primary osteoarthritis, left knee: Secondary | ICD-10-CM | POA: Diagnosis not present

## 2018-01-31 ENCOUNTER — Other Ambulatory Visit: Payer: Self-pay

## 2018-01-31 DIAGNOSIS — I714 Abdominal aortic aneurysm, without rupture, unspecified: Secondary | ICD-10-CM

## 2018-01-31 DIAGNOSIS — I701 Atherosclerosis of renal artery: Secondary | ICD-10-CM

## 2018-02-05 ENCOUNTER — Encounter (INDEPENDENT_AMBULATORY_CARE_PROVIDER_SITE_OTHER): Payer: Self-pay | Admitting: Orthopaedic Surgery

## 2018-02-05 ENCOUNTER — Ambulatory Visit (INDEPENDENT_AMBULATORY_CARE_PROVIDER_SITE_OTHER): Payer: Medicare Other | Admitting: Orthopaedic Surgery

## 2018-02-05 DIAGNOSIS — I701 Atherosclerosis of renal artery: Secondary | ICD-10-CM | POA: Diagnosis not present

## 2018-02-05 DIAGNOSIS — M25562 Pain in left knee: Secondary | ICD-10-CM

## 2018-02-05 DIAGNOSIS — G8929 Other chronic pain: Secondary | ICD-10-CM | POA: Diagnosis not present

## 2018-02-05 HISTORY — DX: Other chronic pain: G89.29

## 2018-02-05 NOTE — Progress Notes (Signed)
Office Visit Note   Patient: Tom [name]           Date of Birth: May 26, 1951           MRN: [id number] Visit Date: 02/05/2018              Requested by: Asencion Noble, MD [address] [place], [place] [address] PCP: Asencion Noble, MD   Assessment & Plan: Visit Diagnoses:  1. Chronic pain of left knee     Plan: Impression is left knee medial meniscus tear.  We will go ahead and obtain an MRI of the left knee to assess for internal derangement.  He will follow-up with [place] once that has been completed.  Call with concerns or questions in the meantime.  Follow-Up Instructions: Return in about 2 weeks (around 02/19/2018) for discuss mri.   Orders:  Orders Placed This Encounter  Procedures  . MR Knee Left w/o contrast   No orders of the defined types were placed in this encounter.     Procedures: No procedures performed   Clinical Data: No additional findings.   Subjective: Chief Complaint  Patient presents with  . Left Knee - Pain    HPI patient is a pleasant 67 year old gentleman who presents to our clinic today with a new injury to his left knee.  Approximately 8 weeks ago, he was kneeling trying to  roll a telephone pole that was in his yard and all of a sudden he felt a pop and had immediate swelling to the left knee.  He began to have pain a few days following this.  He was seen by his primary care provider about 2 weeks ago where x-rays were obtained.  These were negative for fracture and showed minimal osteoarthritis.  He has had continued pain worse medial aspect.  Pain is worse standing for long periods of time as well as when he hyperextends or hyper flexes the knee.  He has tried tramadol without relief of symptoms.  He has been icing and elevating his knee on a nightly basis.  No previous injury to the left knee.  Review of Systems as detailed in HPI.  All others reviewed and are negative.   Objective: Vital Signs: There were no vitals taken for this  visit.  Physical Exam well-developed well-nourished gentleman no acute distress.  Alert and oriented x3.  Ortho Exam examination of the left knee reveals a 1-2+ effusion.  Range of motion 0 to 115 degrees.  Marked tenderness medial greater than lateral joint lines.  Positive medial [place].  He has stable valgus varus stress.  Extensor mechanism intact.  He is neurovascularly intact distally.  Calf is soft and nontender.  Specialty Comments:  No specialty comments available.  Imaging: No new imaging today   PMFS History: Patient Active Problem List   Diagnosis Date Noted  . Chronic pain of left knee 02/05/2018  . Pain of right scapula 02/05/2017  . Atrial fibrillation ([place]) 07/11/2013  . HTN (hypertension) 02/26/2013  . Renal artery stenosis ([place]) 12/19/2012  . Aftercare following surgery of the circulatory system, [place] 07/11/2012  . Abdominal aneurysm without mention of rupture 06/06/2012  . AAA (abdominal aortic aneurysm) ([place]) 05/23/2012  . CAD (coronary artery disease) 01/27/2011  . Tobacco abuse 01/27/2011  . Hypercholesterolemia 01/27/2011  . DIARRHEA 06/17/2010   Past Medical History:  Diagnosis Date  . AAA (abdominal aortic aneurysm) ([place])   . Anxiety   . Cancer [facility]) 2013   skin cancer,  MOHS surgery  . Coronary artery disease   . Dental bridge present    upper  . Dental crowns present   . Family history of adverse reaction to anesthesia    pt's mother has hx. of post-op N/V  . Foreign body of finger of left hand 12/2014   index finger  . GERD (gastroesophageal reflux disease)   . Hypercholesteremia   . Hypertension    states under control with med., has been on med. x 20 yr.  . Myocardial infarction ([place]) 1997  . PAF (paroxysmal atrial fibrillation) ([place])   . Wears glasses     Family History  Problem Relation Age of Onset  . Heart disease Father        before age 60  . Hypertension Father   . Hyperlipidemia Father   . Diabetes Father   . Heart  attack Father   . Heart disease Mother   . Hypertension Mother   . Hyperlipidemia Mother   . Anesthesia problems Mother        hx. of post-op N/V  . AAA (abdominal aortic aneurysm) Maternal Uncle   . Cancer Maternal Uncle   . Hyperlipidemia Sister     Past Surgical History:  Procedure Laterality Date  . ABDOMINAL AORTIC ANEURYSM REPAIR  06/12/2012  . CARDIAC CATHETERIZATION  10/03/2005  . COLONOSCOPY    . CORONARY ARTERY BYPASS GRAFT  10/16/2005   LIMA to LAD saphenous vein to RCA  . FOREIGN BODY REMOVAL Left 01/08/2015   Procedure: EXCISION FOREIGN BODY ;  Surgeon: Daryll Brod, MD;  Location: [place];  Service: Orthopedics;  Laterality: Left;  ANESTHESIA:  GENERAL, IV REGIONAL UPPER ARM  . I&D EXTREMITY Left 11/12/2014   Procedure: IRRIGATION AND DEBRIDEMENT LEFT INDEX FINGER;  Surgeon: Daryll Brod, MD;  Location: [place];  Service: Orthopedics;  Laterality: Left;  . INCISION AND DRAINAGE ABSCESS Left 12/22/2014   Procedure: INCISION AND DRAINAGE ABSCESS LEFT INDEX FINGER;  Surgeon: Daryll Brod, MD;  Location: [place];  Service: Orthopedics;  Laterality: Left;  . PERCUTANEOUS STENT INTERVENTION Left 08/09/2012   Procedure: PERCUTANEOUS STENT INTERVENTION;  Surgeon: Elam Dutch, MD;  Location: [facility] CATH LAB;  Service: Cardiovascular;  Laterality: Left;  stent to lt renal artery  . RENAL ANGIOGRAM N/A 08/09/2012   Procedure: RENAL ANGIOGRAM;  Surgeon: Elam Dutch, MD;  Location: [facility] CATH LAB;  Service: Cardiovascular;  Laterality: N/A;   Social History   Occupational History  . Not on file  Tobacco Use  . Smoking status: Current Every Day Smoker    Packs/day: 0.50    Years: 44.00    Pack years: 22.00    Types: Cigarettes  . Smokeless tobacco: Never Used  Substance and Sexual Activity  . Alcohol use: No    Alcohol/week: 0.0 oz  . Drug use: No  . Sexual activity: Yes

## 2018-02-07 ENCOUNTER — Ambulatory Visit (INDEPENDENT_AMBULATORY_CARE_PROVIDER_SITE_OTHER): Payer: Managed Care, Other (non HMO) | Admitting: Orthopaedic Surgery

## 2018-02-17 ENCOUNTER — Ambulatory Visit
Admission: RE | Admit: 2018-02-17 | Discharge: 2018-02-17 | Disposition: A | Discharge status: Home / Self Care | Payer: Medicare Other | Source: Ambulatory Visit | Attending: Physician Assistant | Admitting: Physician Assistant

## 2018-02-17 DIAGNOSIS — R600 Localized edema: Secondary | ICD-10-CM | POA: Diagnosis not present

## 2018-02-17 DIAGNOSIS — M25562 Pain in left knee: Principal | ICD-10-CM

## 2018-02-17 DIAGNOSIS — G8929 Other chronic pain: Secondary | ICD-10-CM

## 2018-02-19 NOTE — Progress Notes (Signed)
Fu appt to discuss mri sooner than later

## 2018-02-27 ENCOUNTER — Ambulatory Visit (INDEPENDENT_AMBULATORY_CARE_PROVIDER_SITE_OTHER): Payer: Medicare Other | Admitting: Orthopaedic Surgery

## 2018-02-27 ENCOUNTER — Encounter (INDEPENDENT_AMBULATORY_CARE_PROVIDER_SITE_OTHER): Payer: Self-pay | Admitting: Orthopaedic Surgery

## 2018-02-27 DIAGNOSIS — M25562 Pain in left knee: Secondary | ICD-10-CM

## 2018-02-27 DIAGNOSIS — G8929 Other chronic pain: Secondary | ICD-10-CM

## 2018-02-27 MED ORDER — LIDOCAINE HCL 1 % IJ SOLN
5.0000 mL | INTRAMUSCULAR | Status: AC | PRN
Start: 2018-02-27 — End: 2018-02-27
  Administered 2018-02-27: 5 mL

## 2018-02-27 NOTE — Progress Notes (Signed)
Office Visit Note   Patient: Tom [name]           Date of Birth: 09-13-1950           MRN: [id number] Visit Date: 02/27/2018              Requested by: Asencion Noble, MD [address] [place], [place] [address] PCP: Asencion Noble, MD   Assessment & Plan: Visit Diagnoses:  1. Chronic pain of left knee     Plan: Impression is 67 year old left medial femoral condyle osteochondral lesion.  There is bony edema.  Overall radiographic findings and clinical findings are inconsistent with infection.  Knee aspiration performed today which yielded 25 cc of blood-tinged joint fluid without any obvious evidence of infection.  These were sent off to the lab for analysis.  If this is negative for infection we will bring him back next week for a cortisone injection.  Patient in agreement with the plan.  Questions encouraged and answered.  Follow-Up Instructions: Return in about 1 week (around 03/06/2018).   Orders:  No orders of the defined types were placed in this encounter.  No orders of the defined types were placed in this encounter.     Procedures: Large Joint Inj: L knee on 02/27/2018 3:59 PM Details: 22 G needle Medications: 5 mL lidocaine 1 % Aspirate: 25 mL blood-tinged; sent for lab analysis Outcome: tolerated well, no immediate complications Patient was prepped and draped in the usual sterile fashion.       Clinical Data: No additional findings.   Subjective: Chief Complaint  Patient presents with  . Left Knee - Pain    Tom [name] follows up for MRI review he states that he does feel better overall.  He denies any constitutional symptoms.  He had an acute onset of his symptoms after he had an injury about 2 months ago.     Review of Systems   Objective: Vital Signs: There were no vitals taken for this visit.  Physical Exam  Ortho Exam Left knee exam shows a moderate joint effusion.  No evidence of infection.  No warmth.  No pain with knee range of  motion. Specialty Comments:  No specialty comments available.  Imaging: No results found.   PMFS History: Patient Active Problem List   Diagnosis Date Noted  . Chronic pain of left knee 02/05/2018  . Pain of right scapula 02/05/2017  . Atrial fibrillation ([place]) 07/11/2013  . HTN (hypertension) 02/26/2013  . Renal artery stenosis ([place]) 12/19/2012  . Aftercare following surgery of the circulatory system, [place] 07/11/2012  . Abdominal aneurysm without mention of rupture 06/06/2012  . AAA (abdominal aortic aneurysm) ([place]) 05/23/2012  . CAD (coronary artery disease) 01/27/2011  . Tobacco abuse 01/27/2011  . Hypercholesterolemia 01/27/2011  . DIARRHEA 06/17/2010   Past Medical History:  Diagnosis Date  . AAA (abdominal aortic aneurysm) ([place])   . Anxiety   . Cancer [facility]) 2013   skin cancer,   MOHS surgery  . Coronary artery disease   . Dental bridge present    upper  . Dental crowns present   . Family history of adverse reaction to anesthesia    pt's mother has hx. of post-op N/V  . Foreign body of finger of left hand 12/2014   index finger  . GERD (gastroesophageal reflux disease)   . Hypercholesteremia   . Hypertension    states under control with med., has been on med. x 20 yr.  . Myocardial  infarction ([facility]) 1997  . PAF (paroxysmal atrial fibrillation) ([place])   . Wears glasses     Family History  Problem Relation Age of Onset  . Heart disease Father        before age 22  . Hypertension Father   . Hyperlipidemia Father   . Diabetes Father   . Heart attack Father   . Heart disease Mother   . Hypertension Mother   . Hyperlipidemia Mother   . Anesthesia problems Mother        hx. of post-op N/V  . AAA (abdominal aortic aneurysm) Maternal Uncle   . Cancer Maternal Uncle   . Hyperlipidemia Sister     Past Surgical History:  Procedure Laterality Date  . ABDOMINAL AORTIC ANEURYSM REPAIR  06/12/2012  . CARDIAC CATHETERIZATION  10/03/2005  . COLONOSCOPY    . CORONARY  ARTERY BYPASS GRAFT  10/16/2005   LIMA to LAD saphenous vein to RCA  . FOREIGN BODY REMOVAL Left 01/08/2015   Procedure: EXCISION FOREIGN BODY ;  Surgeon: Daryll Brod, MD;  Location: [place];  Service: Orthopedics;  Laterality: Left;  ANESTHESIA:  GENERAL, IV REGIONAL UPPER ARM  . I&D EXTREMITY Left 11/12/2014   Procedure: IRRIGATION AND DEBRIDEMENT LEFT INDEX FINGER;  Surgeon: Daryll Brod, MD;  Location: [place];  Service: Orthopedics;  Laterality: Left;  . INCISION AND DRAINAGE ABSCESS Left 12/22/2014   Procedure: INCISION AND DRAINAGE ABSCESS LEFT INDEX FINGER;  Surgeon: Daryll Brod, MD;  Location: [place];  Service: Orthopedics;  Laterality: Left;  . PERCUTANEOUS STENT INTERVENTION Left 08/09/2012   Procedure: PERCUTANEOUS STENT INTERVENTION;  Surgeon: Elam Dutch, MD;  Location: [facility] CATH LAB;  Service: Cardiovascular;  Laterality: Left;  stent to lt renal artery  . RENAL ANGIOGRAM N/A 08/09/2012   Procedure: RENAL ANGIOGRAM;  Surgeon: Elam Dutch, MD;  Location: [facility] CATH LAB;  Service: Cardiovascular;  Laterality: N/A;   Social History   Occupational History  . Not on file  Tobacco Use  . Smoking status: Current Every Day Smoker    Packs/day: 0.50    Years: 44.00    Pack years: 22.00    Types: Cigarettes  . Smokeless tobacco: Never Used  Substance and Sexual Activity  . Alcohol use: No    Alcohol/week: 0.0 oz  . Drug use: No  . Sexual activity: Yes

## 2018-02-27 NOTE — Addendum Note (Signed)
Addended by: Precious Bard on: 02/27/2018 04:09 PM   Modules accepted: Orders

## 2018-02-28 LAB — SYNOVIAL CELL COUNT + DIFF, W/ CRYSTALS
Basophils, %: 0 %
EOSINOPHILS-SYNOVIAL: 0 % (ref 0–2)
Lymphocytes-Synovial Fld: 32 % (ref 0–74)
Monocyte/Macrophage: 42 % (ref 0–69)
NEUTROPHIL, SYNOVIAL: 24 % (ref 0–24)
Synoviocytes, %: 2 % (ref 0–15)
WBC, SYNOVIAL: 1611 {cells}/uL — AB (ref <150)

## 2018-02-28 LAB — TIQ-NTM

## 2018-03-01 ENCOUNTER — Ambulatory Visit (INDEPENDENT_AMBULATORY_CARE_PROVIDER_SITE_OTHER): Payer: Medicare Other | Admitting: Orthopaedic Surgery

## 2018-03-05 LAB — ANAEROBIC AND AEROBIC CULTURE
AER RESULT: NO GROWTH
MICRO NUMBER:: 90818442
MICRO NUMBER:: 90818443
SPECIMEN QUALITY: ADEQUATE
SPECIMEN QUALITY:: ADEQUATE

## 2018-03-06 ENCOUNTER — Encounter (INDEPENDENT_AMBULATORY_CARE_PROVIDER_SITE_OTHER): Payer: Self-pay | Admitting: Orthopaedic Surgery

## 2018-03-06 ENCOUNTER — Ambulatory Visit (INDEPENDENT_AMBULATORY_CARE_PROVIDER_SITE_OTHER): Payer: Medicare Other | Admitting: Orthopaedic Surgery

## 2018-03-06 DIAGNOSIS — M25562 Pain in left knee: Secondary | ICD-10-CM

## 2018-03-06 DIAGNOSIS — G8929 Other chronic pain: Secondary | ICD-10-CM | POA: Diagnosis not present

## 2018-03-06 MED ORDER — DICLOFENAC SODIUM 1 % TD GEL: 2.0000 g | Freq: Four times a day (QID) | TRANSDERMAL | 5 refills | Status: DC

## 2018-03-06 MED ORDER — BUPIVACAINE HCL 0.5 % IJ SOLN
2.0000 mL | INTRAMUSCULAR | Status: AC | PRN
  Administered 2018-03-06: 2 mL via INTRA_ARTICULAR

## 2018-03-06 MED ORDER — LIDOCAINE HCL 1 % IJ SOLN
2.0000 mL | INTRAMUSCULAR | Status: AC | PRN
  Administered 2018-03-06: 2 mL

## 2018-03-06 MED ORDER — METHYLPREDNISOLONE ACETATE 40 MG/ML IJ SUSP
40.0000 mg | INTRAMUSCULAR | Status: AC | PRN
  Administered 2018-03-06: 40 mg via INTRA_ARTICULAR

## 2018-03-06 NOTE — Progress Notes (Signed)
Office Visit Note   Patient: Tom [name]           Date of Birth: 08/08/1951           MRN: [id number] Visit Date: 03/06/2018              Requested by: Asencion Noble, MD [address] [place], [place] [address] PCP: Asencion Noble, MD   Assessment & Plan: Visit Diagnoses:  1. Chronic pain of left knee     Plan: Left knee was injected with cortisone today.  Patient tolerates well.  Prescription for Voltaren gel.  Increase activity as tolerated.  Recheck in 6 weeks.  Follow-Up Instructions: Return in about 6 weeks (around 04/17/2018).   Orders:  No orders of the defined types were placed in this encounter.  Meds ordered this encounter  Medications  . diclofenac sodium (VOLTAREN) 1 % GEL    Sig: Apply 2 g topically 4 (four) times daily.    Dispense:  1 Tube    Refill:  5      Procedures: Large Joint Inj: L knee on 03/06/2018 2:58 PM Details: 22 G needle Medications: 2 mL bupivacaine 0.5 %; 2 mL lidocaine 1 %; 40 mg methylPREDNISolone acetate 40 MG/ML Outcome: tolerated well, no immediate complications Patient was prepped and draped in the usual sterile fashion.       Clinical Data: No additional findings.   Subjective: Chief Complaint  Patient presents with  . Left Knee - Pain    Patient comes back today for follow-up of his.  Previous aspiration was negative for infection is overall doing better.  No recurrence of effusion.   Review of Systems   Objective: Vital Signs: There were no vitals taken for this visit.  Physical Exam  Ortho Exam Left knee exam is unremarkable. Specialty Comments:  No specialty comments available.  Imaging: No results found.   PMFS History: Patient Active Problem List   Diagnosis Date Noted  . Chronic pain of left knee 02/05/2018  . Pain of right scapula 02/05/2017  . Atrial fibrillation ([place]) 07/11/2013  . HTN (hypertension) 02/26/2013  . Renal artery stenosis ([place]) 12/19/2012  . Aftercare following surgery of  the circulatory system, [place] 07/11/2012  . Abdominal aneurysm without mention of rupture 06/06/2012  . AAA (abdominal aortic aneurysm) ([place]) 05/23/2012  . CAD (coronary artery disease) 01/27/2011  . Tobacco abuse 01/27/2011  . Hypercholesterolemia 01/27/2011  . DIARRHEA 06/17/2010   Past Medical History:  Diagnosis Date  . AAA (abdominal aortic aneurysm) ([place])   . Anxiety   . Cancer [facility]) 2013   skin cancer,   MOHS surgery  . Coronary artery disease   . Dental bridge present    upper  . Dental crowns present   . Family history of adverse reaction to anesthesia    pt's mother has hx. of post-op N/V  . Foreign body of finger of left hand 12/2014   index finger  . GERD (gastroesophageal reflux disease)   . Hypercholesteremia   . Hypertension    states under control with med., has been on med. x 20 yr.  . Myocardial infarction ([place]) 1997  . PAF (paroxysmal atrial fibrillation) ([place])   . Wears glasses     Family History  Problem Relation Age of Onset  . Heart disease Father        before age 64  . Hypertension Father   . Hyperlipidemia Father   . Diabetes Father   . Heart attack Father   .  Heart disease Mother   . Hypertension Mother   . Hyperlipidemia Mother   . Anesthesia problems Mother        hx. of post-op N/V  . AAA (abdominal aortic aneurysm) Maternal Uncle   . Cancer Maternal Uncle   . Hyperlipidemia Sister     Past Surgical History:  Procedure Laterality Date  . ABDOMINAL AORTIC ANEURYSM REPAIR  06/12/2012  . CARDIAC CATHETERIZATION  10/03/2005  . COLONOSCOPY    . CORONARY ARTERY BYPASS GRAFT  10/16/2005   LIMA to LAD saphenous vein to RCA  . FOREIGN BODY REMOVAL Left 01/08/2015   Procedure: EXCISION FOREIGN BODY ;  Surgeon: Daryll Brod, MD;  Location: [place];  Service: Orthopedics;  Laterality: Left;  ANESTHESIA:  GENERAL, IV REGIONAL UPPER ARM  . I&D EXTREMITY Left 11/12/2014   Procedure: IRRIGATION AND DEBRIDEMENT LEFT INDEX FINGER;   Surgeon: Daryll Brod, MD;  Location: [place];  Service: Orthopedics;  Laterality: Left;  . INCISION AND DRAINAGE ABSCESS Left 12/22/2014   Procedure: INCISION AND DRAINAGE ABSCESS LEFT INDEX FINGER;  Surgeon: Daryll Brod, MD;  Location: [place];  Service: Orthopedics;  Laterality: Left;  . PERCUTANEOUS STENT INTERVENTION Left 08/09/2012   Procedure: PERCUTANEOUS STENT INTERVENTION;  Surgeon: Elam Dutch, MD;  Location: [facility] C[facility] LAB;  Service: Cardiovascular;  Laterality: Left;  stent to lt renal artery  . RENAL ANGIOGRAM N/A 08/09/2012   Procedure: RENAL ANGIOGRAM;  Surgeon: Elam Dutch, MD;  Location: [facility] CATH LAB;  Service: Cardiovascular;  Laterality: N/A;   Social History   Occupational History  . Not on file  Tobacco Use  . Smoking status: Current Every Day Smoker    Packs/day: 0.50    Years: 44.00    Pack years: 22.00    Types: Cigarettes  . Smokeless tobacco: Never Used  Substance and Sexual Activity  . Alcohol use: No    Alcohol/week: 0.0 oz  . Drug use: No  . Sexual activity: Yes

## 2018-04-17 ENCOUNTER — Encounter (INDEPENDENT_AMBULATORY_CARE_PROVIDER_SITE_OTHER): Payer: Self-pay | Admitting: Orthopaedic Surgery

## 2018-04-17 ENCOUNTER — Ambulatory Visit (INDEPENDENT_AMBULATORY_CARE_PROVIDER_SITE_OTHER): Payer: Medicare Other | Admitting: Orthopaedic Surgery

## 2018-04-17 DIAGNOSIS — G8929 Other chronic pain: Secondary | ICD-10-CM | POA: Diagnosis not present

## 2018-04-17 DIAGNOSIS — M25562 Pain in left knee: Secondary | ICD-10-CM | POA: Diagnosis not present

## 2018-04-17 DIAGNOSIS — I701 Atherosclerosis of renal artery: Secondary | ICD-10-CM

## 2018-04-17 NOTE — Progress Notes (Signed)
Office Visit Note   Patient: Tom [name]           Date of Birth: 1950/12/13           MRN: [id number] Visit Date: 04/17/2018              Requested by: Asencion Noble, MD [address] [place], [place] [address] PCP: Asencion Noble, MD   Assessment & Plan: Visit Diagnoses:  1. Chronic pain of left knee     Plan: Ezzard has fully recovered from his cortisone injection.  He is doing well from this.  From my standpoint he can increase activity as tolerated.  Home exercise program provided today.  Follow-up as needed.  Follow-Up Instructions: Return if symptoms worsen or fail to improve.   Orders:  No orders of the defined types were placed in this encounter.  No orders of the defined types were placed in this encounter.     Procedures: No procedures performed   Clinical Data: No additional findings.   Subjective: Chief Complaint  Patient presents with  . Left Knee - Follow-up    Christine is following up today status post cortisone injection for his left knee.  He is doing well and states that he has no pain.  He notices a little bit of weakness in his quadriceps but overall he is very happy.  Denies any constitutional symptoms.   Review of Systems   Objective: Vital Signs: There were no vitals taken for this visit.  Physical Exam  Ortho Exam Left knee exam shows no joint effusion.  Excellent range of motion. Specialty Comments:  No specialty comments available.  Imaging: No results found.   PMFS History: Patient Active Problem List   Diagnosis Date Noted  . Chronic pain of left knee 02/05/2018  . Pain of right scapula 02/05/2017  . Atrial fibrillation ([place]) 07/11/2013  . HTN (hypertension) 02/26/2013  . Renal artery stenosis ([place]) 12/19/2012  . Aftercare following surgery of the circulatory system, [place] 07/11/2012  . Abdominal aneurysm without mention of rupture 06/06/2012  . AAA (abdominal aortic aneurysm) ([place]) 05/23/2012  . CAD (coronary artery  disease) 01/27/2011  . Tobacco abuse 01/27/2011  . Hypercholesterolemia 01/27/2011  . DIARRHEA 06/17/2010   Past Medical History:  Diagnosis Date  . AAA (abdominal aortic aneurysm) ([place])   . Anxiety   . Cancer [facility]) 2013   skin cancer,   MOHS surgery  . Coronary artery disease   . Dental bridge present    upper  . Dental crowns present   . Family history of adverse reaction to anesthesia    pt's mother has hx. of post-op N/V  . Foreign body of finger of left hand 12/2014   index finger  . GERD (gastroesophageal reflux disease)   . Hypercholesteremia   . Hypertension    states under control with med., has been on med. x 20 yr.  . Myocardial infarction ([place]) 1997  . PAF (paroxysmal atrial fibrillation) ([place])   . Wears glasses     Family History  Problem Relation Age of Onset  . Heart disease Father        before age 64  . Hypertension Father   . Hyperlipidemia Father   . Diabetes Father   . Heart attack Father   . Heart disease Mother   . Hypertension Mother   . Hyperlipidemia Mother   . Anesthesia problems Mother        hx. of post-op N/V  . AAA (  abdominal aortic aneurysm) Maternal Uncle   . Cancer Maternal Uncle   . Hyperlipidemia Sister     Past Surgical History:  Procedure Laterality Date  . ABDOMINAL AORTIC ANEURYSM REPAIR  06/12/2012  . CARDIAC CATHETERIZATION  10/03/2005  . COLONOSCOPY    . CORONARY ARTERY BYPASS GRAFT  10/16/2005   LIMA to LAD saphenous vein to RCA  . FOREIGN BODY REMOVAL Left 01/08/2015   Procedure: EXCISION FOREIGN BODY ;  Surgeon: Daryll Brod, MD;  Location: [place];  Service: Orthopedics;  Laterality: Left;  ANESTHESIA:  GENERAL, IV REGIONAL UPPER ARM  . I&D EXTREMITY Left 11/12/2014   Procedure: IRRIGATION AND DEBRIDEMENT LEFT INDEX FINGER;  Surgeon: Daryll Brod, MD;  Location: [place];  Service: Orthopedics;  Laterality: Left;  . INCISION AND DRAINAGE ABSCESS Left 12/22/2014   Procedure: INCISION AND  DRAINAGE ABSCESS LEFT INDEX FINGER;  Surgeon: Daryll Brod, MD;  Location: [place];  Service: Orthopedics;  Laterality: Left;  . PERCUTANEOUS STENT INTERVENTION Left 08/09/2012   Procedure: PERCUTANEOUS STENT INTERVENTION;  Surgeon: Elam Dutch, MD;  Location: [facility] CATH LAB;  Service: Cardiovascular;  Laterality: Left;  stent to lt renal artery  . RENAL ANGIOGRAM N/A 08/09/2012   Procedure: RENAL ANGIOGRAM;  Surgeon: Elam Dutch, MD;  Location: [facility] CATH LAB;  Service: Cardiovascular;  Laterality: N/A;   Social History   Occupational History  . Not on file  Tobacco Use  . Smoking status: Current Every Day Smoker    Packs/day: 0.50    Years: 44.00    Pack years: 22.00    Types: Cigarettes  . Smokeless tobacco: Never Used  Substance and Sexual Activity  . Alcohol use: No    Alcohol/week: 0.0 standard drinks  . Drug use: No  . Sexual activity: Yes

## 2018-05-06 DIAGNOSIS — I10 Essential (primary) hypertension: Secondary | ICD-10-CM | POA: Diagnosis not present

## 2018-05-06 DIAGNOSIS — I480 Paroxysmal atrial fibrillation: Secondary | ICD-10-CM | POA: Diagnosis not present

## 2018-05-20 DIAGNOSIS — H5203 Hypermetropia, bilateral: Secondary | ICD-10-CM | POA: Diagnosis not present

## 2018-05-20 DIAGNOSIS — H2513 Age-related nuclear cataract, bilateral: Secondary | ICD-10-CM | POA: Diagnosis not present

## 2018-05-20 DIAGNOSIS — D3131 Benign neoplasm of right choroid: Secondary | ICD-10-CM | POA: Diagnosis not present

## 2018-06-11 DIAGNOSIS — D485 Neoplasm of uncertain behavior of skin: Secondary | ICD-10-CM | POA: Diagnosis not present

## 2018-06-11 DIAGNOSIS — D2261 Melanocytic nevi of right upper limb, including shoulder: Secondary | ICD-10-CM | POA: Diagnosis not present

## 2018-06-11 DIAGNOSIS — Z85828 Personal history of other malignant neoplasm of skin: Secondary | ICD-10-CM | POA: Diagnosis not present

## 2018-06-11 DIAGNOSIS — D692 Other nonthrombocytopenic purpura: Secondary | ICD-10-CM | POA: Diagnosis not present

## 2018-06-11 DIAGNOSIS — L738 Other specified follicular disorders: Secondary | ICD-10-CM | POA: Diagnosis not present

## 2018-06-11 DIAGNOSIS — D225 Melanocytic nevi of trunk: Secondary | ICD-10-CM | POA: Diagnosis not present

## 2018-06-11 DIAGNOSIS — L821 Other seborrheic keratosis: Secondary | ICD-10-CM | POA: Diagnosis not present

## 2018-06-20 ENCOUNTER — Telehealth: Payer: Self-pay | Admitting: Cardiovascular Disease

## 2018-06-20 ENCOUNTER — Ambulatory Visit: Payer: Medicare Other | Admitting: Cardiovascular Disease

## 2018-06-20 ENCOUNTER — Encounter: Payer: Self-pay | Admitting: Cardiovascular Disease

## 2018-06-20 ENCOUNTER — Ambulatory Visit (INDEPENDENT_AMBULATORY_CARE_PROVIDER_SITE_OTHER): Payer: Medicare Other | Admitting: Cardiovascular Disease

## 2018-06-20 VITALS — BP 120/64 | HR 57 | Ht 70.0 in | Wt 168.1 lb

## 2018-06-20 DIAGNOSIS — I701 Atherosclerosis of renal artery: Secondary | ICD-10-CM | POA: Diagnosis not present

## 2018-06-20 DIAGNOSIS — I2510 Atherosclerotic heart disease of native coronary artery without angina pectoris: Secondary | ICD-10-CM

## 2018-06-20 DIAGNOSIS — I480 Paroxysmal atrial fibrillation: Secondary | ICD-10-CM | POA: Diagnosis not present

## 2018-06-20 DIAGNOSIS — E782 Mixed hyperlipidemia: Secondary | ICD-10-CM | POA: Diagnosis not present

## 2018-06-20 LAB — CBC
Hematocrit: 41.7 % (ref 37.5–51.0)
Hemoglobin: 14.5 g/dL (ref 13.0–17.7)
MCH: 30.5 pg (ref 26.6–33.0)
MCHC: 34.8 g/dL (ref 31.5–35.7)
MCV: 88 fL (ref 79–97)
Platelets: 412 10*3/uL (ref 150–450)
RBC: 4.75 x10E6/uL (ref 4.14–5.80)
RDW: 13.1 % (ref 12.3–15.4)
WBC: 13.2 10*3/uL — AB (ref 3.4–10.8)

## 2018-06-20 LAB — BASIC METABOLIC PANEL
BUN / CREAT RATIO: 11 (ref 10–24)
BUN: 17 mg/dL (ref 8–27)
CO2: 24 mmol/L (ref 20–29)
CREATININE: 1.52 mg/dL — AB (ref 0.76–1.27)
Calcium: 9.6 mg/dL (ref 8.6–10.2)
Chloride: 90 mmol/L — ABNORMAL LOW (ref 96–106)
GFR calc Af Amer: 54 mL/min/{1.73_m2} — ABNORMAL LOW (ref >59)
GFR calc non Af Amer: 47 mL/min/{1.73_m2} — ABNORMAL LOW (ref >59)
Glucose: 68 mg/dL (ref 65–99)
Potassium: 3.9 mmol/L (ref 3.5–5.2)
SODIUM: 130 mmol/L — AB (ref 134–144)

## 2018-06-20 LAB — HEPATIC FUNCTION PANEL
ALK PHOS: 82 IU/L (ref 39–117)
ALT: 28 IU/L (ref 0–44)
AST: 25 IU/L (ref 0–40)
Albumin: 4.4 g/dL (ref 3.6–4.8)
Bilirubin Total: 0.4 mg/dL (ref 0.0–1.2)
Bilirubin, Direct: 0.12 mg/dL (ref 0.00–0.40)
TOTAL PROTEIN: 6.7 g/dL (ref 6.0–8.5)

## 2018-06-20 LAB — LIPID PANEL
CHOL/HDL RATIO: 3.6 ratio (ref 0.0–5.0)
Cholesterol, Total: 118 mg/dL (ref 100–199)
HDL: 33 mg/dL — AB (ref >39)
LDL CALC: 66 mg/dL (ref 0–99)
Triglycerides: 94 mg/dL (ref 0–149)
VLDL CHOLESTEROL CAL: 19 mg/dL (ref 5–40)

## 2018-06-20 MED ORDER — APIXABAN 5 MG PO TABS: 5.0000 mg | ORAL_TABLET | Freq: Two times a day (BID) | ORAL | 3 refills | Status: DC

## 2018-06-20 NOTE — Telephone Encounter (Signed)
Lab results have been reviewed and Dr. Acie Fredrickson advised patient may be prescribed Eliquis 5 mg BID. Rx sent to [facility].

## 2018-06-20 NOTE — Telephone Encounter (Signed)
New message  Pt c/o medication issue:  1. Name of Medication: rivaroxaban (XARELTO) 20 MG TABS tablet  2. How are you currently taking this medication (dosage and times per day)? t1 time daily at night with supper  3. Are you having a reaction (difficulty breathing--STAT)? No   4. What is your medication issue? Patient checked with [facility] and he states that he wants to switch to Eliquis. He states that this change would save him money.

## 2018-06-20 NOTE — Patient Instructions (Addendum)
Medication Instructions:  Your physician recommends that you continue on your current medications as directed. Please refer to the Current Medication list given to you today.  If you need a refill on your cardiac medications before your next appointment, please call your pharmacy.   Lab work: TODAY - cholesterol, liver panel, basic metabolic panel, CBC  If you have labs (blood work) drawn today and your tests are completely normal, you will receive your results only by: [name] Kitchen MyChart Message (if you have MyChart) OR . A paper copy in the mail If you have any lab test that is abnormal or we need to change your treatment, we will call you to review the results.   Testing/Procedures: None Ordered   Follow-Up: Our office will call you to schedule an appointment for A Fib Clinic or with Dr. Lovena Le to discuss options to manage A fib  At [facility], you and your health needs are our priority.  As part of our continuing mission to provide you with exceptional heart care, we have created designated Provider Care Teams.  These Care Teams include your primary Cardiologist (physician) and Advanced Practice Providers (APPs -  Physician Assistants and Nurse Practitioners) who all work together to provide you with the care you need, when you need it. You will need a follow up appointment in:  1 years.  Please call our office 2 months in advance to schedule this appointment.  You may see Mertie Moores, MD or one of the following Advanced Practice Providers on your designated Care Team: Richardson Dopp, PA-C [place], [place] . Daune Perch, NP

## 2018-06-20 NOTE — Telephone Encounter (Signed)
Spoke with patient who states he called [employer] and has been advised that Eliquis will be cheaper for him. He requests a Rx for Eliquis to be sent in to [facility]. He understands that we will await lab results from today and will verify dose then send in Rx. I advised patient to take 1st Eliquis 24 hours after his last Xarelto then take the Eliquis every 12 hours. He verbalized understanding and was grateful for the help.

## 2018-06-20 NOTE — Progress Notes (Signed)
Tom [name] Date of Birth  1951/08/08 [facility] [address]    [place], [place]  [address] [phone]  Fax  [phone]  Problem list: 1. Coronary artery disease-status post CABG. February 2007. He had a LIMA  to the LAD and a SVG to the right coronary artery. 2. Hypertension 3. Hyperlipidemia 4. Renal insufficiency 5. Peripheral Vascular disease:  S/p AAA stent graft repair ( Oct. 2013)  s/p renal artery stenting  ( Dec. 2013) 6. Paroxysmal atrial fibrillation:      67 y.o. year-old gentleman with a history of coronary disease. He status post coronary artery bypass grafting in February 2007. He had a left internal mammary artery to the LAD and a saphenous vein graft to the right coronary artery. He also has a history of hypercholesterolemia, hypertension, and ongoing cigarette smoking. He's not had any episodes of chest pain, shortness breath, syncope, or presyncope.  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 He does not do any regular aerobic exercise. He's not had any recurrent episodes of chest pain. He tries to watch his salt intake.  He still smokes about a half a pack of cigarettes a day but has cut back from 2 packs a day. 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  November 22, 2012:  He has had an AAA stent graft repair and renal artery stenting since I last saw him.  He has had several episodes of chest pressure / tightness.  He found his BP and Hr  to be elevated.  He took and extra Toprol and ASA and felt better within an hour.  Oct. 24, 2014:  Tom [name] is having CP and palpitations .  Some relief with Inderal but occasionally has to take some NTG.  Symptoms seen to occur more at night - not during the day while he is working.  stil smoking 1/2 ppd.  Nov. 21, 2014:  Tom [name] is doing ok.  He was diagnosed with atrial fib by event monitor.  He has had several episodes of PAF - resolved after several  propranolol  Jan 02, 2014:  He is doing well.  Still have PAF - probably once a month.  Will last 4-6 hours.   He does not drink ETOH.   still smoking some .  Works as a Curator.   Oct. 24, 2016:  No CP ,  No recent episodes of PAF since starting the Multaq.  Was started by Dr. Lovena Le in Oct. 2015 Has not been taking his amlodipine due to leg swelling . Doing well.   BP is typcially ok  Getting some exercise Yard work  Retired from painting    Oct. 24, 2017:  Tom [name] is doing well.  Thinks he forgot his BP meds today .  Typical BP is well controlled.  Hs PAF - was started on Multaq.   Oct. 25, 2018:  Has lost 25 lbs. - purposeful weight loss Started cutting back on his BP meds Stopped Valsartan when it was recalled.  Has had orthostasis - once while using a chain saw. Several episodes of orthostasis getting up to the BR at night .   No CP , no dyspnea No syncope since adjusting his meds.  Cut his toprol XL to 50  Changed valsartan to Losartan  Has not had any a-fib since being on Multaq  Oct. 31, 2019:  Tom [name] is seen today for follow up of his CAD,  HTN,  HLD   and PAF .   Wt. Is 168-  Had an episode of leg swelling on 1 occasion. Is on Multaq - his primary MD gave him some HCTZ to take on occasion .  Seems to be related to being on his feet.  Takes the HCTZ maybe 1-2 times a week .  Thinks he may be able to get eliquis cheaper than Xarelto .  Has been taking the Multaq once a day due to cost  Encouraged him to see Dr. Lovena Le again if he wants to switch .   Current Outpatient Medications on File Prior to Visit  Medication Sig Dispense Refill  . alprazolam (XANAX) 2 MG tablet Take 2 mg by mouth at bedtime.    [name] Kitchen aspirin 81 MG tablet Take 1 tablet (81 mg total) by mouth daily.    [name] Kitchen atorvastatin (LIPITOR) 40 MG tablet Take 1 tablet (40 mg total) by mouth daily. 90 tablet 3  . diclofenac sodium (VOLTAREN) 1 % GEL Apply 2 g topically 4 (four) times daily. 1 Tube 5  .  dronedarone (MULTAQ) 400 MG tablet Take 1 tablet (400 mg total) by mouth 2 (two) times daily with a meal. 180 tablet 3  . losartan (COZAAR) 50 MG tablet Take 50 mg by mouth daily.    . metoprolol succinate (TOPROL-XL) 50 MG 24 hr tablet Take 1 tablet (50 mg total) by mouth daily. Take with or immediately following a meal. 90 tablet 3  . nitroGLYCERIN (NITROSTAT) 0.4 MG SL tablet Place 1 tablet (0.4 mg total) under the tongue every 5 (five) minutes as needed. Chest pain 25 tablet 6  . pantoprazole (PROTONIX) 40 MG tablet Take 40 mg by mouth daily.      . rivaroxaban (XARELTO) 20 MG TABS tablet TAKE 1 TABLET BY MOUTH DAILY WITH SUPPER 90 tablet 3   No current facility-administered medications on file prior to visit.     Allergies  Allergen Reactions  . Niacin And Related Hives  . Codeine Other (See Comments)    GI upset ONLY in the cough syrup   . Niacin Hives    Past Medical History:  Diagnosis Date  . AAA (abdominal aortic aneurysm) ([place])   . Anxiety   . Cancer [facility]) 2013   skin cancer,   MOHS surgery  . Coronary artery disease   . Dental bridge present    upper  . Dental crowns present   . Family history of adverse reaction to anesthesia    pt's mother has hx. of post-op N/V  . Foreign body of finger of left hand 12/2014   index finger  . GERD (gastroesophageal reflux disease)   . Hypercholesteremia   . Hypertension    states under control with med., has been on med. x 20 yr.  . Myocardial infarction ([place]) 1997  . PAF (paroxysmal atrial fibrillation) ([place])   . Wears glasses     Past Surgical History:  Procedure Laterality Date  . ABDOMINAL AORTIC ANEURYSM REPAIR  06/12/2012  . CARDIAC CATHETERIZATION  10/03/2005  . COLONOSCOPY    . CORONARY ARTERY BYPASS GRAFT  10/16/2005   LIMA to LAD saphenous vein to RCA  . FOREIGN BODY REMOVAL Left 01/08/2015   Procedure: EXCISION FOREIGN BODY ;  Surgeon: Daryll Brod, MD;  Location: [place];  Service: Orthopedics;   Laterality: Left;  ANESTHESIA:  GENERAL, IV REGIONAL UPPER ARM  . I&D EXTREMITY Left 11/12/2014   Procedure: IRRIGATION AND DEBRIDEMENT LEFT INDEX  FINGER;  Surgeon: Daryll Brod, MD;  Location: [place];  Service: Orthopedics;  Laterality: Left;  . INCISION AND DRAINAGE ABSCESS Left 12/22/2014   Procedure: INCISION AND DRAINAGE ABSCESS LEFT INDEX FINGER;  Surgeon: Daryll Brod, MD;  Location: [place];  Service: Orthopedics;  Laterality: Left;  . PERCUTANEOUS STENT INTERVENTION Left 08/09/2012   Procedure: PERCUTANEOUS STENT INTERVENTION;  Surgeon: Elam Dutch, MD;  Location: [facility] CATH LAB;  Service: Cardiovascular;  Laterality: Left;  stent to lt renal artery  . RENAL ANGIOGRAM N/A 08/09/2012   Procedure: RENAL ANGIOGRAM;  Surgeon: Elam Dutch, MD;  Location: [facility] CATH LAB;  Service: Cardiovascular;  Laterality: N/A;    Social History   Tobacco Use  Smoking Status Current Every Day Smoker  . Packs/day: 0.50  . Years: 44.00  . Pack years: 22.00  . Types: Cigarettes  Smokeless Tobacco Never Used    Social History   Substance and Sexual Activity  Alcohol Use No  . Alcohol/week: 0.0 standard drinks    Family History  Problem Relation Age of Onset  . Heart disease Father        before age 60  . Hypertension Father   . Hyperlipidemia Father   . Diabetes Father   . Heart attack Father   . Heart disease Mother   . Hypertension Mother   . Hyperlipidemia Mother   . Anesthesia problems Mother        hx. of post-op N/V  . AAA (abdominal aortic aneurysm) Maternal Uncle   . Cancer Maternal Uncle   . Hyperlipidemia Sister     Reviw of Systems:  Reviewed in the HPI.  All other systems are negative.  Physical Exam: There were no vitals taken for this visit.  GEN:  Well nourished, well developed in no acute distress HEENT: Normal NECK: No JVD; No carotid bruits LYMPHATICS: No lymphadenopathy CARDIAC: RRR  RESPIRATORY:  Clear to auscultation  without rales, wheezing or rhonchi  ABDOMEN: Soft, non-tender, non-distended MUSCULOSKELETAL:  No edema; No deformity  SKIN: Warm and dry NEUROLOGIC:  Alert and oriented x 3   ECG:  Oct. 31, 2019:   Sinus brady at 58.   Possible Ant. Mi     Assessment / Plan:   1. Coronary artery disease-status post CABG.   he is not having any episodes of chest pain.  Continue current medications.  2. Hypertension  :    Pressure is well controlled.  He has occasional swelling and takes hydrochlorothiazide once or twice a week.  He usually holds his olmesartan on those days.  3. Hyperlipidemia:    Has a history of hyperlipidemia.  Will check fasting labs today as well as a basic metabolic profile and liver enzymes.  4. Renal insufficiency  5. Peripheral Vascular disease:     6. Paroxysmal atrial fibrillation:     On Xarelto.  He thinks that he might be able to get Eliquis cheaper.  He will call his insurance company and we will make that change if needed. Complains of the price of Multaq.   Will have him see to Dr. Lovena Le or the AF clinic  to explore other options     Will see him in a year   Mertie Moores, MD  06/20/2018 8:18 AM    [facility] [place] [address],  [place], [place]  [address] Pager [phone] Phone: [phone]; Fax: [phone]

## 2018-06-27 ENCOUNTER — Ambulatory Visit (INDEPENDENT_AMBULATORY_CARE_PROVIDER_SITE_OTHER): Payer: Medicare Other | Admitting: Family

## 2018-06-27 ENCOUNTER — Ambulatory Visit: Payer: Medicare Other | Admitting: Family

## 2018-06-27 ENCOUNTER — Encounter: Payer: Self-pay | Admitting: Family

## 2018-06-27 ENCOUNTER — Ambulatory Visit (HOSPITAL_COMMUNITY)
Admission: RE | Admit: 2018-06-27 | Discharge: 2018-06-27 | Disposition: A | Discharge status: Home / Self Care | Payer: Medicare Other | Source: Ambulatory Visit | Attending: Internal Medicine | Admitting: Internal Medicine

## 2018-06-27 ENCOUNTER — Other Ambulatory Visit (HOSPITAL_COMMUNITY): Payer: Medicare Other

## 2018-06-27 ENCOUNTER — Encounter (HOSPITAL_COMMUNITY): Payer: Medicare Other

## 2018-06-27 ENCOUNTER — Other Ambulatory Visit: Payer: Self-pay

## 2018-06-27 VITALS — BP 117/65 | HR 57 | Temp 97.7°F | Resp 18 | Ht 70.0 in | Wt 166.0 lb

## 2018-06-27 DIAGNOSIS — I701 Atherosclerosis of renal artery: Secondary | ICD-10-CM | POA: Insufficient documentation

## 2018-06-27 DIAGNOSIS — I714 Abdominal aortic aneurysm, without rupture, unspecified: Secondary | ICD-10-CM

## 2018-06-27 DIAGNOSIS — Z95828 Presence of other vascular implants and grafts: Secondary | ICD-10-CM | POA: Diagnosis not present

## 2018-06-27 NOTE — Progress Notes (Signed)
[facility] [place]   CC: Follow up left renal artery stenosis   History of Present Illness  Tom [name] is a 67 y.o. (1950-11-21) male who is s/p left renal artery stenting,Gore Excluder Stent Graft repair of abdominal aortic aneursym, and Bilateral common femoral endarterectomy in 2013 by Dr. Oneida Alar and Dr. Bridgett Larsson.    He has had some recurrent stenosis of the left renal artery.  He has had an elevated creatinine in the past as high as 2.1 in October 2017.  This improved to 1.24 May 2017, 1.52 on 06-20-18.  He is on Xarelto for atrial fibrillation.  He remains very active overall.  He denies any abdominal or back pain.  He denies any claudication type symptoms.   He is on 3 antihyptertensives.  Dr. Oneida Alar last evaluated pt on 12-20-17. At that time patient had a renal duplex exam which showed increased velocities in the proximal left renal artery of 434 cm/s compared to 405 six months prior.  No change in renal mass or cortical thickness. Asymptomatic most likely recurrent stenosis left renal artery.  He had no increase in his blood pressure over time. Patient was to follow-up with our nurse practitioner in 6 months time with a repeat renal duplex.  If he remains stable with no changes in his blood pressure, no significant changes in his velocities, renal mass, or renal function, Dr. Oneida Alar indicated that he would be inclined at that time to continue to follow this with serial ultrasound exam.  If any above criteria are met we would consider an arteriogram.  He also needs a repeat ultrasound of his abdominal aortic stent graft in January 2020.  The patient denies claudication in legs with walking . The patient denies history of stroke or TIA symptoms.  He takes a daily ASA and a statin. He takes Xarelto, will switch to Eliquis, has PAF.   Diabetic: No Tobacco use: smoker  (1/2 ppd x 44 yrs)  Past Medical History:  Diagnosis Date  . AAA (abdominal aortic aneurysm)  ([place])   . Anxiety   . Cancer [facility]) 2013   skin cancer,   MOHS surgery  . Coronary artery disease   . Dental bridge present    upper  . Dental crowns present   . Family history of adverse reaction to anesthesia    pt's mother has hx. of post-op N/V  . Foreign body of finger of left hand 12/2014   index finger  . GERD (gastroesophageal reflux disease)   . Hypercholesteremia   . Hypertension    states under control with med., has been on med. x 20 yr.  . Myocardial infarction ([place]) 1997  . PAF (paroxysmal atrial fibrillation) ([place])   . Wears glasses    Past Surgical History:  Procedure Laterality Date  . ABDOMINAL AORTIC ANEURYSM REPAIR  06/12/2012  . CARDIAC CATHETERIZATION  10/03/2005  . COLONOSCOPY    . CORONARY ARTERY BYPASS GRAFT  10/16/2005   LIMA to LAD saphenous vein to RCA  . FOREIGN BODY REMOVAL Left 01/08/2015   Procedure: EXCISION FOREIGN BODY ;  Surgeon: Daryll Brod, MD;  Location: [place];  Service: Orthopedics;  Laterality: Left;  ANESTHESIA:  GENERAL, IV REGIONAL UPPER ARM  . I&D EXTREMITY Left 11/12/2014   Procedure: IRRIGATION AND DEBRIDEMENT LEFT INDEX FINGER;  Surgeon: Daryll Brod, MD;  Location: [place];  Service: Orthopedics;  Laterality: Left;  . INCISION AND DRAINAGE ABSCESS Left 12/22/2014   Procedure:  INCISION AND DRAINAGE ABSCESS LEFT INDEX FINGER;  Surgeon: Daryll Brod, MD;  Location: [place];  Service: Orthopedics;  Laterality: Left;  . PERCUTANEOUS STENT INTERVENTION Left 08/09/2012   Procedure: PERCUTANEOUS STENT INTERVENTION;  Surgeon: Elam Dutch, MD;  Location: [facility] CATH LAB;  Service: Cardiovascular;  Laterality: Left;  stent to lt renal artery  . RENAL ANGIOGRAM N/A 08/09/2012   Procedure: RENAL ANGIOGRAM;  Surgeon: Elam Dutch, MD;  Location: [facility] CATH LAB;  Service: Cardiovascular;  Laterality: N/A;   Social History Social History   Socioeconomic History  . Marital status: Married    Spouse  name: Not on file  . Number of children: 1 s  . Years of education: Not on file  . Highest education level: Not on file  Occupational History  . Not on file  Social Needs  . Financial resource strain: Not on file  . Food insecurity:    Worry: Not on file    Inability: Not on file  . Transportation needs:    Medical: Not on file    Non-medical: Not on file  Tobacco Use  . Smoking status: Current Every Day Smoker    Packs/day: 0.50    Years: 44.00    Pack years: 22.00    Types: Cigarettes  . Smokeless tobacco: Never Used  Substance and Sexual Activity  . Alcohol use: No    Alcohol/week: 0.0 standard drinks  . Drug use: No  . Sexual activity: Yes  Lifestyle  . Physical activity:    Days per week: Not on file    Minutes per session: Not on file  . Stress: Not on file  Relationships  . Social connections:    Talks on phone: Not on file    Gets together: Not on file    Attends religious service: Not on file    Active member of club or organization: Not on file    Attends meetings of clubs or organizations: Not on file    Relationship status: Not on file  . Intimate partner violence:    Fear of current or ex partner: Not on file    Emotionally abused: Not on file    Physically abused: Not on file    Forced sexual activity: Not on file  Other Topics Concern  . Not on file  Social History Narrative  . Not on file   Family History Family History  Problem Relation Age of Onset  . Heart disease Father        before age 24  . Hypertension Father   . Hyperlipidemia Father   . Diabetes Father   . Heart attack Father   . Heart disease Mother   . Hypertension Mother   . Hyperlipidemia Mother   . Anesthesia problems Mother        hx. of post-op N/V  . AAA (abdominal aortic aneurysm) Maternal Uncle   . Cancer Maternal Uncle   . Hyperlipidemia Sister     Current Outpatient Medications on File Prior to Visit  Medication Sig Dispense Refill  . alprazolam (XANAX) 2 MG  tablet Take 2 mg by mouth at bedtime.    [name] Kitchen apixaban (ELIQUIS) 5 MG TABS tablet Take 1 tablet (5 mg total) by mouth 2 (two) times daily. 180 tablet 3  . aspirin 81 MG tablet Take 1 tablet (81 mg total) by mouth daily.    [name] Kitchen atorvastatin (LIPITOR) 40 MG tablet Take 1 tablet (40 mg total) by mouth daily. 90 tablet  3  . dronedarone (MULTAQ) 400 MG tablet Take 1 tablet (400 mg total) by mouth 2 (two) times daily with a meal. 180 tablet 3  . metoprolol succinate (TOPROL-XL) 50 MG 24 hr tablet Take 1 tablet (50 mg total) by mouth daily. Take with or immediately following a meal. 90 tablet 3  . olmesartan (BENICAR) 20 MG tablet Take 1 tablet by mouth daily.    . pantoprazole (PROTONIX) 40 MG tablet Take 40 mg by mouth daily.      . chlorthalidone (HYGROTON) 25 MG tablet TAKE 1 TABLET BY MOUTH EVERY DAY IN THE MORNING  12  . nitroGLYCERIN (NITROSTAT) 0.4 MG SL tablet Place 1 tablet (0.4 mg total) under the tongue every 5 (five) minutes as needed. Chest pain (Patient not taking: Reported on 06/27/2018) 25 tablet 6   No current facility-administered medications on file prior to visit.    Allergies  Allergen Reactions  . Niacin And Related Hives  . Codeine Other (See Comments)    GI upset ONLY in the cough syrup   . Niacin Hives    ROS: See HPI for pertinent positives and negatives.  Physical Examination  Vitals:   06/27/18 0956  BP: 117/65  Pulse: (!) 57  Resp: 18  Temp: 97.7 F (36.5 C)  TempSrc: Oral  SpO2: 99%  Weight: 166 lb (75.3 kg)  Height: 5' 10"  (1.778 m)   Body mass index is 23.82 kg/m.  General: A&O x 3, WD, slim male. HEENT: Grossly intact and WNL.  Pulmonary: Sym exp, respirations are non labored, good air movt, CTAB, no rales, rhonchi, or wheezing. Cardiac: Regular rhythm and rate, no detected murmur.  Carotid Bruits Right Left   Negative Positive   Adominal aortic pulse is palpable Radial pulses are 2+ palpable                          VASCULAR EXAM:                                                                                                          LE Pulses Right Left       FEMORAL  2+ palpable  2+ palpable        POPLITEAL  not palpable   2+ palpable       POSTERIOR TIBIAL  2+ palpable   not palpable        DORSALIS PEDIS      ANTERIOR TIBIAL 1+ palpable  1+ palpable     Gastrointestinal: soft, NTND, -G/R, - HSM, - masses palpated, - CVAT B. Musculoskeletal: M/S 5/5 throughout, Extremities without ischemic changes. Skin: No rashes, no ulcers, no cellulitis.   Neurologic: CN 2-12 intact, Pain and light touch intact in extremities are intact, Motor exam as listed above. Psychiatric: Normal thought content, mood appropriate to clinical situation.    DATA  Bilateral Renal Artery Duplex (06/27/2018): Highest velocity is 336 cm/s at the proximal left renal artery, distal stent; this is less than 434 cm/s on duplex of 12-20-17.  Normal cortical thickness and normal size of both kidneys.  Incidental findings of a cyst in each kidney; right renal cyst measures 2.7 x 2.9 cm; left renal cyst measures 2.7 x 3.3 cm.     Left Carotid bruit, pt has no hx of stroke or TIA: January 2019 Carotid duplex demonstrated 1-39% bilateral ICA stenosis.    Medical Decision Making  The patient is a 67 y.o. male who is s/p left renal artery stenting ,Gore Excluder Stent Graft repair of abdominal aortic aneursym, and Bilateral common femoral endarterectomy in 2013 by Dr. Oneida Alar and Dr. Bridgett Larsson.   Less stenosis in the left renal artery stent today at 336 cm/s compared to 434 cm/s on 12-20-17.   He has had an elevated creatinine in the past as high as 2.1 in October 2017.  This improved to 1.24 May 2017, 1.52 on 06-20-18.   He has had some recurrent stenosis of the left renal artery.   If he remains stable with no changes in his blood pressure, no significant changes in his velocities, renal mass, or renal function, Dr. Oneida Alar indicated that he would be inclined  at that time to continue to follow this with serial ultrasound exam.  If any above criteria are met we would consider an arteriogram.  He also needs a repeat ultrasound of his abdominal aortic stent graft in January 2020.    Based on this patient's exam and diagnostic studies, the patient will follow up in January 2020 with the following studies: EVAR duplex; bilateral renal artery duplex in 6 months.   Incidental finding of bilateral renal cysts, defer to pt PCP re further evaluation of these.   I emphasized the importance of maximal medical management including strict control of blood pressure, blood glucose, and lipid levels, antiplatelet agents, obtaining regular exercise, and cessation of smoking.   The patient was advised to call 911 should the patient experience sudden onset abdominal or back pain.   Thank you for allowing [place] to participate in this patient's care.  Clemon Chambers, RN, MSN, FNP-C [facility] [place] Office: [phone]  Clinic Physician: Oneida Alar  06/27/2018, 10:21 AM

## 2018-06-27 NOTE — Patient Instructions (Addendum)
Steps to Quit Smoking Smoking tobacco can be bad for your health. It can also affect almost every organ in your body. Smoking puts you and people around you at risk for many serious long-lasting (chronic) diseases. Quitting smoking is hard, but it is one of the best things that you can do for your health. It is never too late to quit. What are the benefits of quitting smoking? When you quit smoking, you lower your risk for getting serious diseases and conditions. They can include:  Lung cancer or lung disease.  Heart disease.  Stroke.  Heart attack.  Not being able to have children (infertility).  Weak bones (osteoporosis) and broken bones (fractures).  If you have coughing, wheezing, and shortness of breath, those symptoms may get better when you quit. You may also get sick less often. If you are pregnant, quitting smoking can help to lower your chances of having a baby of low birth weight. What can I do to help me quit smoking? Talk with your doctor about what can help you quit smoking. Some things you can do (strategies) include:  Quitting smoking totally, instead of slowly cutting back how much you smoke over a period of time.  Going to in-person counseling. You are more likely to quit if you go to many counseling sessions.  Using resources and support systems, such as: ? Online chats with a counselor. ? Phone quitlines. ? Printed self-help materials. ? Support groups or group counseling. ? Text messaging programs. ? Mobile phone apps or applications.  Taking medicines. Some of these medicines may have nicotine in them. If you are pregnant or breastfeeding, do not take any medicines to quit smoking unless your doctor says it is okay. Talk with your doctor about counseling or other things that can help you.  Talk with your doctor about using more than one strategy at the same time, such as taking medicines while you are also going to in-person counseling. This can help make  quitting easier. What things can I do to make it easier to quit? Quitting smoking might feel very hard at first, but there is a lot that you can do to make it easier. Take these steps:  Talk to your family and friends. Ask them to support and encourage you.  Call phone quitlines, reach out to support groups, or work with a counselor.  Ask people who smoke to not smoke around you.  Avoid places that make you want (trigger) to smoke, such as: ? Bars. ? Parties. ? Smoke-break areas at work.  Spend time with people who do not smoke.  Lower the stress in your life. Stress can make you want to smoke. Try these things to help your stress: ? Getting regular exercise. ? Deep-breathing exercises. ? Yoga. ? Meditating. ? Doing a body scan. To do this, close your eyes, focus on one area of your body at a time from head to toe, and notice which parts of your body are tense. Try to relax the muscles in those areas.  Download or buy apps on your mobile phone or tablet that can help you stick to your quit plan. There are many free apps, such as QuitGuide from the CDC (Centers for Disease Control and Prevention). You can find more support from smokefree.gov and other websites.  This information is not intended to replace advice given to you by your health care provider. Make sure you discuss any questions you have with your health care provider. Document Released: 06/03/2009 Document   Revised: 04/04/2016 Document Reviewed: 12/22/2014 Elsevier Interactive Patient Education  [employer].    Before your next abdominal ultrasound:  Avoid gas forming foods and beverages the day before the test.   Take two Extra-Strength Gas-X capsules at bedtime the night before the test. Take another two Extra-Strength Gas-X capsules in the middle of the night if you get up to the restroom, if not, first thing in the morning with water.  Do not chew gum.     Renal Artery Stenosis Renal artery stenosis  (RAS) is narrowing of the artery that carries blood to your kidneys. It can affect one or both kidneys. Your kidneys filter waste and extra fluid from your blood. You get rid of the waste and fluid when you urinate. Your kidneys also make an important chemical messenger (hormone) called renin. Renin helps regulate your blood pressure. The first sign of RAS may be high blood pressure. Over time, other symptoms can develop. What are the causes? Plaque buildup in your arteries (atherosclerosis) is the main cause of RAS. The plaques that cause this are made up of:  Fat.  Cholesterol.  Calcium.  Other substances.  As these substances build up in your renal artery, this slows the blood supply to your kidneys. The lack of blood and oxygen causes the signs and symptoms of RAS. A much less common cause of RAS is a disease called fibromuscular dysplasia. This disease causes abnormal cell growth that narrows the renal artery. It is not related to atherosclerosis. It occurs mostly in women who are 34-43 years old. It may be passed down through families. What increases the risk? You may be at risk for renal artery stenosis if you:  Are a man who is at least 66 years old.  Are a woman who is at least 66 years old.  Have high blood pressure.  Have high cholesterol.  Are a smoker.  Abuse alcohol.  Have diabetes or prediabetes.  Are overweight.  Have a family history of early heart disease.  What are the signs or symptoms? RAS usually develops slowly. You may not have any signs or symptoms at first. The earliest signs may be:  Developing high blood pressure.  A sudden increase in existing high blood pressure.  No longer responding to medicine that used to control your blood pressure.  Later signs and symptoms are due to kidney damage. They may include:  Fatigue.  Shortness of breath.  Swollen legs and feet.  Dry skin.  Headaches.  Muscle cramps.  Loss of appetite.  Nausea  or vomiting.  How is this diagnosed? Your health care provider may suspect RAS based on changes in your blood pressure and your risk factors. A physical exam will be done. Your health care provider may use a stethoscope to listen for a whooshing sound (bruit) that can occur where the renal artery is blocking blood flow. Several tests may be done to confirm a diagnosis of RAS. These may include:  Blood and urine tests to check your kidney function.  Imaging tests of your kidneys, such as: ? A test that involves using sound waves to create an image of your kidneys and the blood flow to your kidneys (ultrasound). ? A test in which dye is injected into one of your blood vessels so images can be taken as the dye flows through your renal arteries (angiogram). These tests can be done using X-rays, a CT scan (computed tomography angiogram, CTA), or a type of MRI (magnetic resonance angiogram,  MRA).  How is this treated? Making lifestyle changes to reduce your risk factors is the first treatment option for early RAS. If the blood flow to one of your kidneys is cut by more than half, you may need medicine to:  Lower your blood pressure. This is the main medical treatment for RAS. You may need more than one type of medicine for this. The two types that work best for RAS are: ? ACE inhibitors. ? Angiotensin receptor blockers.  Reduce fluid in the body (diuretics).  Lower your cholesterol (statins).  If medicine is not enough to control RAS, you may need surgery. This may involve:  Threading a tube with an inflatable balloon into the renal artery to force it open (angioplasty).  Removing plaque from inside the artery (endarterectomy).  Follow these instructions at home:  Take medicines only as directed by your health care provider.  Make any lifestyle changes recommended by your health care provider. This may include: ? Working with a dietitian to maintain a heart-healthy diet. This type of diet  is low in saturated fat, salt, and added sugar. ? Starting an exercise program as directed by your health care provider. ? Maintaining a healthy weight. ? Quitting smoking. ? Not abusing alcohol.  Keep all follow-up visits as directed by your health care provider. This is important. Contact a health care provider if:  Your symptoms of RAS are not getting better.  Your symptoms are changing or getting worse. Get help right away if:  You have very bad pain in your back or abdomen.  You have blood in your urine. This information is not intended to replace advice given to you by your health care provider. Make sure you discuss any questions you have with your health care provider. Document Released: 05/03/2005 Document Revised: 01/13/2016 Document Reviewed: 11/20/2013 Elsevier Interactive Patient Education  [employer].

## 2018-07-01 ENCOUNTER — Other Ambulatory Visit: Payer: Self-pay | Admitting: Cardiovascular Disease

## 2018-07-12 ENCOUNTER — Encounter: Payer: Self-pay | Admitting: Internal Medicine

## 2018-07-12 ENCOUNTER — Ambulatory Visit (INDEPENDENT_AMBULATORY_CARE_PROVIDER_SITE_OTHER): Payer: Medicare Other | Admitting: Internal Medicine

## 2018-07-12 VITALS — BP 124/60 | HR 54 | Ht 70.0 in | Wt 169.0 lb

## 2018-07-12 DIAGNOSIS — I480 Paroxysmal atrial fibrillation: Secondary | ICD-10-CM

## 2018-07-12 NOTE — Patient Instructions (Addendum)
Medication Instructions:  Your physician recommends that you continue on your current medications as directed. Please refer to the Current Medication list given to you today.  Labwork: None ordered.  Testing/Procedures: None ordered.  Follow-Up: Your physician wants you to follow-up in: end of March with Dr. Lovena Le.       Any Other Special Instructions Will Be Listed Below (If Applicable).  If you need a refill on your cardiac medications before your next appointment, please call your pharmacy.

## 2018-07-12 NOTE — Progress Notes (Signed)
HPI Tom [name] returns today for followup. He is a pleasant 67 yo man with a h/o PAF, HTN, CAD, s/p CABG, HTN, and dyslipidemia. He has been AA drug therapy with Multaq. Because of the cost he has been taking the multaq only once a day. Despite this he has not had recurrent atrial fib. Because of his insurance and the donut hole, he thinks he has about 3-4 months of multaq at one tablet a day. He feels palpitations when he goes into atrial fib. He has had some problems with peripheral edema and has been taking HCTZ.  No anginal symptoms.  Allergies  Allergen Reactions  . Niacin And Related Hives  . Codeine Other (See Comments)    GI upset ONLY in the cough syrup   . Niacin Hives     Current Outpatient Medications  Medication Sig Dispense Refill  . alprazolam (XANAX) 2 MG tablet Take 2 mg by mouth at bedtime.    [name] Kitchen apixaban (ELIQUIS) 5 MG TABS tablet Take 1 tablet (5 mg total) by mouth 2 (two) times daily. 180 tablet 3  . aspirin 81 MG tablet Take 1 tablet (81 mg total) by mouth daily.    [name] Kitchen atorvastatin (LIPITOR) 40 MG tablet Take 1 tablet (40 mg total) by mouth daily. 90 tablet 3  . chlorthalidone (HYGROTON) 25 MG tablet TAKE 1 TABLET BY MOUTH EVERY DAY IN THE MORNING  12  . dronedarone (MULTAQ) 400 MG tablet Take 400 mg by mouth daily.    . metoprolol succinate (TOPROL-XL) 50 MG 24 hr tablet TAKE 1 TABLET EVERY DAY  WITH  OR  IMMEDIATELY  FOLLOWING A MEAL 90 tablet 3  . nitroGLYCERIN (NITROSTAT) 0.4 MG SL tablet Place 1 tablet (0.4 mg total) under the tongue every 5 (five) minutes as needed. Chest pain 25 tablet 6  . olmesartan (BENICAR) 20 MG tablet Take 1 tablet by mouth daily.    . pantoprazole (PROTONIX) 40 MG tablet Take 40 mg by mouth daily.       No current facility-administered medications for this visit.      Past Medical History:  Diagnosis Date  . AAA (abdominal aortic aneurysm) ([place])   . Anxiety   . Cancer [facility]) 2013   skin cancer,   MOHS surgery  . Coronary  artery disease   . Dental bridge present    upper  . Dental crowns present   . Family history of adverse reaction to anesthesia    pt's mother has hx. of post-op N/V  . Foreign body of finger of left hand 12/2014   index finger  . GERD (gastroesophageal reflux disease)   . Hypercholesteremia   . Hypertension    states under control with med., has been on med. x 20 yr.  . Myocardial infarction ([place]) 1997  . PAF (paroxysmal atrial fibrillation) ([place])   . Wears glasses     ROS:   All systems reviewed and negative except as noted in the HPI.   Past Surgical History:  Procedure Laterality Date  . ABDOMINAL AORTIC ANEURYSM REPAIR  06/12/2012  . CARDIAC CATHETERIZATION  10/03/2005  . COLONOSCOPY    . CORONARY ARTERY BYPASS GRAFT  10/16/2005   LIMA to LAD saphenous vein to RCA  . FOREIGN BODY REMOVAL Left 01/08/2015   Procedure: EXCISION FOREIGN BODY ;  Surgeon: Daryll Brod, MD;  Location: [place];  Service: Orthopedics;  Laterality: Left;  ANESTHESIA:  GENERAL, IV REGIONAL UPPER ARM  .  I&D EXTREMITY Left 11/12/2014   Procedure: IRRIGATION AND DEBRIDEMENT LEFT INDEX FINGER;  Surgeon: Daryll Brod, MD;  Location: [place];  Service: Orthopedics;  Laterality: Left;  . INCISION AND DRAINAGE ABSCESS Left 12/22/2014   Procedure: INCISION AND DRAINAGE ABSCESS LEFT INDEX FINGER;  Surgeon: Daryll Brod, MD;  Location: [place];  Service: Orthopedics;  Laterality: Left;  . PERCUTANEOUS STENT INTERVENTION Left 08/09/2012   Procedure: PERCUTANEOUS STENT INTERVENTION;  Surgeon: Elam Dutch, MD;  Location: [facility] CATH LAB;  Service: Cardiovascular;  Laterality: Left;  stent to lt renal artery  . RENAL ANGIOGRAM N/A 08/09/2012   Procedure: RENAL ANGIOGRAM;  Surgeon: Elam Dutch, MD;  Location: [facility] CATH LAB;  Service: Cardiovascular;  Laterality: N/A;     Family History  Problem Relation Age of Onset  . Heart disease Father        before age 22  .  Hypertension Father   . Hyperlipidemia Father   . Diabetes Father   . Heart attack Father   . Heart disease Mother   . Hypertension Mother   . Hyperlipidemia Mother   . Anesthesia problems Mother        hx. of post-op N/V  . AAA (abdominal aortic aneurysm) Maternal Uncle   . Cancer Maternal Uncle   . Hyperlipidemia Sister      Social History   Socioeconomic History  . Marital status: Married    Spouse name: Not on file  . Number of children: 1 s  . Years of education: Not on file  . Highest education level: Not on file  Occupational History  . Not on file  Social Needs  . Financial resource strain: Not on file  . Food insecurity:    Worry: Not on file    Inability: Not on file  . Transportation needs:    Medical: Not on file    Non-medical: Not on file  Tobacco Use  . Smoking status: Current Every Day Smoker    Packs/day: 0.50    Years: 44.00    Pack years: 22.00    Types: Cigarettes  . Smokeless tobacco: Never Used  Substance and Sexual Activity  . Alcohol use: No    Alcohol/week: 0.0 standard drinks  . Drug use: No  . Sexual activity: Yes  Lifestyle  . Physical activity:    Days per week: Not on file    Minutes per session: Not on file  . Stress: Not on file  Relationships  . Social connections:    Talks on phone: Not on file    Gets together: Not on file    Attends religious service: Not on file    Active member of club or organization: Not on file    Attends meetings of clubs or organizations: Not on file    Relationship status: Not on file  . Intimate partner violence:    Fear of current or ex partner: Not on file    Emotionally abused: Not on file    Physically abused: Not on file    Forced sexual activity: Not on file  Other Topics Concern  . Not on file  Social History Narrative  . Not on file     BP 124/60   Pulse (!) 54   Ht 5\' 10"  (1.778 m)   Wt 169 lb (76.7 kg)   SpO2 99%   BMI 24.25 kg/m   Physical Exam:  Well appearing  NAD HEENT: Unremarkable Neck:  No JVD, no  thyromegally Lymphatics:  No adenopathy Back:  No CVA tenderness Lungs:  Clear with no wheezes HEART:  Regular rate rhythm, no murmurs, no rubs, no clicks Abd:  soft, positive bowel sounds, no organomegally, no rebound, no guarding Ext:  2 plus pulses, no edema, no cyanosis, no clubbing Skin:  No rashes no nodules Neuro:  CN II through XII intact, motor grossly intact  EKG - NSR   Assess/Plan: 1. PAF - he is maintaining NSR on low dose multaq. With his comorbidities he has 3 other medical options for control of his atrial fib. Sotalol, dofetilide, and amiodarone and I discussed the pro's and con's of all. Because he is controlled, and because he has already purchased the [place], he will continue this. If he starts to have break through of atrial fib, he will increase back to twice daily. I will see him in March/April of 2020 and we will discuss the options above some more. I would be inclined to have him try dofetilide in the long term. 2. CAD - he denies anginal symptoms. 3. HTN - if he ultimately ends of on dofetilide, he will have to stop HCTZ.  Mikle Bosworth.D.

## 2018-07-14 ENCOUNTER — Encounter: Payer: Self-pay | Admitting: Internal Medicine

## 2018-07-24 ENCOUNTER — Other Ambulatory Visit: Payer: Self-pay

## 2018-08-08 ENCOUNTER — Other Ambulatory Visit: Payer: Self-pay

## 2018-08-08 DIAGNOSIS — I701 Atherosclerosis of renal artery: Secondary | ICD-10-CM

## 2018-08-08 DIAGNOSIS — I714 Abdominal aortic aneurysm, without rupture, unspecified: Secondary | ICD-10-CM

## 2018-08-08 DIAGNOSIS — Z95828 Presence of other vascular implants and grafts: Secondary | ICD-10-CM

## 2018-08-26 DIAGNOSIS — I10 Essential (primary) hypertension: Secondary | ICD-10-CM | POA: Diagnosis not present

## 2018-08-26 DIAGNOSIS — I714 Abdominal aortic aneurysm, without rupture: Secondary | ICD-10-CM | POA: Diagnosis not present

## 2018-08-26 DIAGNOSIS — Z23 Encounter for immunization: Secondary | ICD-10-CM | POA: Diagnosis not present

## 2018-08-26 DIAGNOSIS — I2510 Atherosclerotic heart disease of native coronary artery without angina pectoris: Secondary | ICD-10-CM | POA: Diagnosis not present

## 2018-08-30 ENCOUNTER — Encounter: Payer: Self-pay | Admitting: Family

## 2018-08-30 ENCOUNTER — Ambulatory Visit (HOSPITAL_COMMUNITY)
Admission: RE | Admit: 2018-08-30 | Discharge: 2018-08-30 | Disposition: A | Discharge status: Home / Self Care | Payer: Medicare Other | Source: Ambulatory Visit | Attending: Family | Admitting: Family

## 2018-08-30 ENCOUNTER — Ambulatory Visit (INDEPENDENT_AMBULATORY_CARE_PROVIDER_SITE_OTHER): Payer: Medicare Other | Admitting: Family

## 2018-08-30 ENCOUNTER — Other Ambulatory Visit: Payer: Self-pay

## 2018-08-30 VITALS — BP 88/63 | HR 52 | Resp 16 | Ht 70.0 in | Wt 169.0 lb

## 2018-08-30 DIAGNOSIS — I6523 Occlusion and stenosis of bilateral carotid arteries: Secondary | ICD-10-CM | POA: Diagnosis not present

## 2018-08-30 DIAGNOSIS — I701 Atherosclerosis of renal artery: Secondary | ICD-10-CM

## 2018-08-30 DIAGNOSIS — F17200 Nicotine dependence, unspecified, uncomplicated: Secondary | ICD-10-CM

## 2018-08-30 DIAGNOSIS — I714 Abdominal aortic aneurysm, without rupture, unspecified: Secondary | ICD-10-CM

## 2018-08-30 DIAGNOSIS — Z95828 Presence of other vascular implants and grafts: Secondary | ICD-10-CM | POA: Insufficient documentation

## 2018-08-30 NOTE — Progress Notes (Signed)
[facility] [place]  CC: Follow up s/p Endovascular Repair of Abdominal Aortic Aneurysm    History of Present Illness  KOEHN SALEHI is a 68 y.o. (12-22-1950) male who is s/p left renal artery stenting,Gore Excluder Stent Graft repair of abdominal aortic aneursym, and Bilateral common femoral endarterectomy in 2013 by Dr. Oneida Alar and Dr. Bridgett Larsson.   He has had some recurrent stenosis of the left renal artery. He has had an elevated creatinine in the past as high as 2.1 in October 2017. This improved to 1.24 May 2017, 1.52 on 06-20-18.  He is on Xarelto for atrial fibrillation. He remains very active overall.  He denies any abdominal or back pain. He denies any claudication type symptoms.  He is on 3 antihyptertensives.  Dr. Oneida Alar last evaluated pt on 12-20-17. At that time patient had a renal duplex exam which showed increased velocities in the proximal left renal artery of 434 cm/s compared to 405 six months prior. No change in renal mass or cortical thickness. Asymptomatic most likely recurrent stenosis left renal artery. He had no increase in his blood pressure over time. Patient was to follow-up with our nurse practitioner in 6 months time with a repeat renal duplex. If he remains stable with no changes in his blood pressure, no significant changes in his velocities, renal mass, or renal function, Dr. Oneida Alar indicated that he would be inclined at that time to continue to follow this with serial ultrasound exam. If any above criteria are met we would consider an arteriogram. He also needs a repeat ultrasound of his abdominal aortic stent graft in January 2020.  He denies claudication type symptoms with walking, denies any known history of stroke or TIA.  He has a hx of an MI with CABG in 2007.   He denies chest pain or dyspnea, denies feeling light headed. His blood pressure is low today, states his PCP is adjusting his hypertension medications. He states his  blood pressure at home is [id number] systolic.    Pt states that he discussed the incidental finding of renal cysts on a previous abdominal duplex with his PCP.   He takes a daily ASA and a statin. He takes Eliquis, has PAF.   His ROS and PMHX are unchanged compared to his last exam.    Diabetic: No Tobaccos use: smoker  (1/2 ppd, started smoking about age 80 yrs)   Past Medical History:  Diagnosis Date  . AAA (abdominal aortic aneurysm) ([place])   . Anxiety   . Cancer [facility]) 2013   skin cancer,   MOHS surgery  . Coronary artery disease   . Dental bridge present    upper  . Dental crowns present   . Family history of adverse reaction to anesthesia    pt's mother has hx. of post-op N/V  . Foreign body of finger of left hand 12/2014   index finger  . GERD (gastroesophageal reflux disease)   . Hypercholesteremia   . Hypertension    states under control with med., has been on med. x 20 yr.  . Myocardial infarction ([place]) 1997  . PAF (paroxysmal atrial fibrillation) ([place])   . Wears glasses    Past Surgical History:  Procedure Laterality Date  . ABDOMINAL AORTIC ANEURYSM REPAIR  06/12/2012  . CARDIAC CATHETERIZATION  10/03/2005  . COLONOSCOPY    . CORONARY ARTERY BYPASS GRAFT  10/16/2005   LIMA to LAD saphenous vein to RCA  . FOREIGN BODY REMOVAL Left 01/08/2015  Procedure: EXCISION FOREIGN BODY ;  Surgeon: Daryll Brod, MD;  Location: [place];  Service: Orthopedics;  Laterality: Left;  ANESTHESIA:  GENERAL, IV REGIONAL UPPER ARM  . I&D EXTREMITY Left 11/12/2014   Procedure: IRRIGATION AND DEBRIDEMENT LEFT INDEX FINGER;  Surgeon: Daryll Brod, MD;  Location: [place];  Service: Orthopedics;  Laterality: Left;  . INCISION AND DRAINAGE ABSCESS Left 12/22/2014   Procedure: INCISION AND DRAINAGE ABSCESS LEFT INDEX FINGER;  Surgeon: Daryll Brod, MD;  Location: [place];  Service: Orthopedics;  Laterality: Left;  . PERCUTANEOUS STENT INTERVENTION  Left 08/09/2012   Procedure: PERCUTANEOUS STENT INTERVENTION;  Surgeon: Elam Dutch, MD;  Location: [facility] CATH LAB;  Service: Cardiovascular;  Laterality: Left;  stent to lt renal artery  . RENAL ANGIOGRAM N/A 08/09/2012   Procedure: RENAL ANGIOGRAM;  Surgeon: Elam Dutch, MD;  Location: [facility] CATH LAB;  Service: Cardiovascular;  Laterality: N/A;   Social History Social History   Tobacco Use  . Smoking status: Current Every Day Smoker    Packs/day: 0.50    Years: 44.00    Pack years: 22.00    Types: Cigarettes  . Smokeless tobacco: Never Used  Substance Use Topics  . Alcohol use: No    Alcohol/week: 0.0 standard drinks  . Drug use: No   Family History Family History  Problem Relation Age of Onset  . Heart disease Father        before age 8  . Hypertension Father   . Hyperlipidemia Father   . Diabetes Father   . Heart attack Father   . Heart disease Mother   . Hypertension Mother   . Hyperlipidemia Mother   . Anesthesia problems Mother        hx. of post-op N/V  . AAA (abdominal aortic aneurysm) Maternal Uncle   . Cancer Maternal Uncle   . Hyperlipidemia Sister    Current Outpatient Medications on File Prior to Visit  Medication Sig Dispense Refill  . alprazolam (XANAX) 2 MG tablet Take 2 mg by mouth at bedtime.    [name] Kitchen apixaban (ELIQUIS) 5 MG TABS tablet Take 1 tablet (5 mg total) by mouth 2 (two) times daily. 180 tablet 3  . aspirin 81 MG tablet Take 1 tablet (81 mg total) by mouth daily.    [name] Kitchen atorvastatin (LIPITOR) 40 MG tablet Take 1 tablet (40 mg total) by mouth daily. 90 tablet 3  . chlorthalidone (HYGROTON) 25 MG tablet TAKE 1 TABLET BY MOUTH EVERY DAY IN THE MORNING  12  . dronedarone (MULTAQ) 400 MG tablet Take 400 mg by mouth daily.    . metoprolol succinate (TOPROL-XL) 50 MG 24 hr tablet TAKE 1 TABLET EVERY DAY  WITH  OR  IMMEDIATELY  FOLLOWING A MEAL 90 tablet 3  . nitroGLYCERIN (NITROSTAT) 0.4 MG SL tablet Place 1 tablet (0.4 mg total) under the tongue  every 5 (five) minutes as needed. Chest pain 25 tablet 6  . pantoprazole (PROTONIX) 40 MG tablet Take 40 mg by mouth daily.      [name] Kitchen olmesartan (BENICAR) 20 MG tablet Take 1 tablet by mouth daily.     No current facility-administered medications on file prior to visit.    Allergies  Allergen Reactions  . Niacin And Related Hives  . Codeine Other (See Comments)    GI upset ONLY in the cough syrup   . Niacin Hives     ROS: See HPI for pertinent positives and negatives.  Physical  Examination  Vitals:   08/30/18 0839  BP: (!) 88/63  Pulse: (!) 52  Resp: 16  SpO2: 97%  Weight: 169 lb (76.7 kg)  Height: 5' 10"  (1.778 m)   Body mass index is 24.25 kg/m.  General: A&O x 3, WD, slender male HEENT: No gross abnormalities  Pulmonary: Sym exp, respirations are non labored, good air movement in all fields CTAB, no rales, rhonchi, or wheezes. Cardiac: Regular rhythm, bradycardic (on a beta blocker), no murmur appreciated  Vascular: Vessel Right Left  Radial 1+Palpable 1+Palpable  Carotid  without bruit  without bruit  Aorta moderately palpable N/A  Femoral 3+Palpable 1+Palpable  Popliteal 1+ palpable Not palpable  PT Not Palpable Not Palpable  DP 1+Palpable Faintly Palpable   Gastrointestinal: soft, NTND, -G/R, - HSM, - palpable masses, - CVAT B. Musculoskeletal: M/S 5/5 throughout, extremities without ischemic changes. Skin: No rashes, no ulcers, no cellulitis.  Feet and toes are pink with brisk capillary refill.  Neurologic: Pain and light touch intact in extremities, Motor exam as listed above. Psychiatric: Normal thought content, mood appropriate for clinical situation.     DATA  EVAR Duplex   Previous (Date: 09-13-17) AAA sac size: 4.0 cm; Right CIA: 1.4 cm; Left CIA: 1.4 cm  Current (Date: 08-30-18)  AAA sac size: 3.26 cm; Right CIA: 1.48 cm; Left CIA: 1.43 cm  no endoleak detected Decrease in size of AAA sac compared to the exam on 09-13-17.   Bilateral Renal  Artery Duplex (06/27/2018): Highest velocity is 336 cm/s at the proximal left renal artery, distal stent; this is less than 434 cm/s on duplex of 12-20-17.  Normal cortical thickness and normal size of both kidneys.  Incidental findings of a cyst in each kidney; right renal cyst measures 2.7 x 2.9 cm; left renal cyst measures 2.7 x 3.3 cm.     Pt has no hx of stroke or TIA: January 2019 Carotid duplex demonstrated 1-39% bilateral ICA stenosis.   Medical Decision Making  DAVONTE SIEBENALER is a 68 y.o. male who who is s/p left renal artery stenting ,Gore Excluder Stent Graft repair of abdominal aortic aneursym, and Bilateral common femoral endarterectomy in 2013 by Dr. Oneida Alar and Dr. Bridgett Larsson.  Less stenosis in the left renal artery stent on 06-27-18 at 336 cm/s compared to 434 cm/s on 12-20-17.   He has had an elevated creatinine in the past as high as 2.1 in October 2017. This improved to 1.24 May 2017, 1.52 on 06-20-18.  He is hypotensive today, is asymptomatic of this.   He has had some recurrent stenosis of the left renal artery.  If he remains stable with no changes in his blood pressure, no significant changes in his velocities, renal mass, or renal function, Dr. Oneida Alar indicated that he would be inclined at that time to continue to follow this with serial ultrasound exam. If any above criteria are met we would consider an arteriogram.   Over 3 minutes was spent counseling patient re smoking cessation, and patient was given several free resources re smoking cessation.   Based on this patient's exam and diagnostic studies, the patient will follow up in 1 year with EVAR duplex, bilateral renal artery duplex, and carotid duplex.    I emphasized the importance of maximal medical management including strict control of blood pressure, blood glucose, and lipid levels, antiplatelet agents, obtaining regular exercise, and cessation of smoking.   Thank you for allowing [place] to participate in this  patient's care.  Vinnie Level  Jane Broughton, RN, MSN, FNP-C [facility] [place] Office: [phone]  Clinic Physician: Donzetta Matters  08/30/2018, 8:45 AM

## 2018-08-30 NOTE — Patient Instructions (Signed)
Before your next abdominal ultrasound: ° °Avoid gas forming foods and beverages the day before the test.   °Take two Extra-Strength Gas-X capsules at bedtime the night before the test. °Take another two Extra-Strength Gas-X capsules in the middle of the night if you get up to the restroom, if not, first thing in the morning with water.  °Do not chew gum.  ° ° ° ° °Steps to Quit Smoking ° °Smoking tobacco can be bad for your health. It can also affect almost every organ in your body. Smoking puts you and people around you at risk for many serious long-lasting (chronic) diseases. Quitting smoking is hard, but it is one of the best things that you can do for your health. It is never too late to quit. °What are the benefits of quitting smoking? °When you quit smoking, you lower your risk for getting serious diseases and conditions. They can include: °· Lung cancer or lung disease. °· Heart disease. °· Stroke. °· Heart attack. °· Not being able to have children (infertility). °· Weak bones (osteoporosis) and broken bones (fractures). °If you have coughing, wheezing, and shortness of breath, those symptoms may get better when you quit. You may also get sick less often. If you are pregnant, quitting smoking can help to lower your chances of having a baby of low birth weight. °What can I do to help me quit smoking? °Talk with your doctor about what can help you quit smoking. Some things you can do (strategies) include: °· Quitting smoking totally, instead of slowly cutting back how much you smoke over a period of time. °· Going to in-person counseling. You are more likely to quit if you go to many counseling sessions. °· Using resources and support systems, such as: °? Online chats with a counselor. °? Phone quitlines. °? Printed self-help materials. °? Support groups or group counseling. °? Text messaging programs. °? Mobile phone apps or applications. °· Taking medicines. Some of these medicines may have nicotine in them.  If you are pregnant or breastfeeding, do not take any medicines to quit smoking unless your doctor says it is okay. Talk with your doctor about counseling or other things that can help you. °Talk with your doctor about using more than one strategy at the same time, such as taking medicines while you are also going to in-person counseling. This can help make quitting easier. °What things can I do to make it easier to quit? °Quitting smoking might feel very hard at first, but there is a lot that you can do to make it easier. Take these steps: °· Talk to your family and friends. Ask them to support and encourage you. °· Call phone quitlines, reach out to support groups, or work with a counselor. °· Ask people who smoke to not smoke around you. °· Avoid places that make you want (trigger) to smoke, such as: °? Bars. °? Parties. °? Smoke-break areas at work. °· Spend time with people who do not smoke. °· Lower the stress in your life. Stress can make you want to smoke. Try these things to help your stress: °? Getting regular exercise. °? Deep-breathing exercises. °? Yoga. °? Meditating. °? Doing a body scan. To do this, close your eyes, focus on one area of your body at a time from head to toe, and notice which parts of your body are tense. Try to relax the muscles in those areas. °· Download or buy apps on your mobile phone or tablet that can   you stick to your quit plan. There are many free apps, such as QuitGuide from the [employer] Office manager for Disease Control and Prevention). You can find more support from smokefree.gov and other websites. This information is not intended to replace advice given to you by your health care provider. Make sure you discuss any questions you have with your health care provider. Document Released: 06/03/2009 Document Revised: 04/04/2016 Document Reviewed: 12/22/2014 Elsevier Interactive Patient Education  2019 [employer].

## 2018-10-09 ENCOUNTER — Other Ambulatory Visit: Payer: Self-pay

## 2018-10-09 ENCOUNTER — Observation Stay (HOSPITAL_COMMUNITY)
Admission: EM | Admit: 2018-10-09 | Discharge: 2018-10-10 | Disposition: A | Discharge status: Home / Self Care | Payer: Medicare Other | Attending: Internal Medicine | Admitting: Internal Medicine

## 2018-10-09 ENCOUNTER — Emergency Department (HOSPITAL_COMMUNITY): Payer: Medicare Other

## 2018-10-09 ENCOUNTER — Encounter (HOSPITAL_COMMUNITY): Payer: Self-pay | Admitting: Emergency Medicine

## 2018-10-09 DIAGNOSIS — R55 Syncope and collapse: Secondary | ICD-10-CM | POA: Insufficient documentation

## 2018-10-09 DIAGNOSIS — E785 Hyperlipidemia, unspecified: Secondary | ICD-10-CM | POA: Diagnosis not present

## 2018-10-09 DIAGNOSIS — E7800 Pure hypercholesterolemia, unspecified: Secondary | ICD-10-CM | POA: Insufficient documentation

## 2018-10-09 DIAGNOSIS — I714 Abdominal aortic aneurysm, without rupture, unspecified: Secondary | ICD-10-CM | POA: Diagnosis present

## 2018-10-09 DIAGNOSIS — F17210 Nicotine dependence, cigarettes, uncomplicated: Secondary | ICD-10-CM | POA: Diagnosis not present

## 2018-10-09 DIAGNOSIS — I129 Hypertensive chronic kidney disease with stage 1 through stage 4 chronic kidney disease, or unspecified chronic kidney disease: Secondary | ICD-10-CM | POA: Insufficient documentation

## 2018-10-09 DIAGNOSIS — Z7982 Long term (current) use of aspirin: Secondary | ICD-10-CM | POA: Diagnosis not present

## 2018-10-09 DIAGNOSIS — I700 Atherosclerosis of aorta: Secondary | ICD-10-CM | POA: Diagnosis not present

## 2018-10-09 DIAGNOSIS — K219 Gastro-esophageal reflux disease without esophagitis: Secondary | ICD-10-CM | POA: Diagnosis not present

## 2018-10-09 DIAGNOSIS — Z955 Presence of coronary angioplasty implant and graft: Secondary | ICD-10-CM | POA: Diagnosis not present

## 2018-10-09 DIAGNOSIS — I2510 Atherosclerotic heart disease of native coronary artery without angina pectoris: Secondary | ICD-10-CM | POA: Diagnosis not present

## 2018-10-09 DIAGNOSIS — Z79899 Other long term (current) drug therapy: Secondary | ICD-10-CM | POA: Insufficient documentation

## 2018-10-09 DIAGNOSIS — R079 Chest pain, unspecified: Secondary | ICD-10-CM | POA: Diagnosis not present

## 2018-10-09 DIAGNOSIS — R0602 Shortness of breath: Secondary | ICD-10-CM | POA: Diagnosis not present

## 2018-10-09 DIAGNOSIS — Z7901 Long term (current) use of anticoagulants: Secondary | ICD-10-CM | POA: Diagnosis not present

## 2018-10-09 DIAGNOSIS — Z85828 Personal history of other malignant neoplasm of skin: Secondary | ICD-10-CM | POA: Insufficient documentation

## 2018-10-09 DIAGNOSIS — I959 Hypotension, unspecified: Secondary | ICD-10-CM | POA: Diagnosis not present

## 2018-10-09 DIAGNOSIS — R4020 Unspecified coma: Secondary | ICD-10-CM | POA: Diagnosis not present

## 2018-10-09 DIAGNOSIS — R001 Bradycardia, unspecified: Secondary | ICD-10-CM | POA: Diagnosis not present

## 2018-10-09 DIAGNOSIS — I081 Rheumatic disorders of both mitral and tricuspid valves: Secondary | ICD-10-CM | POA: Diagnosis not present

## 2018-10-09 DIAGNOSIS — I252 Old myocardial infarction: Secondary | ICD-10-CM | POA: Insufficient documentation

## 2018-10-09 DIAGNOSIS — N183 Chronic kidney disease, stage 3 (moderate): Secondary | ICD-10-CM | POA: Diagnosis not present

## 2018-10-09 DIAGNOSIS — F419 Anxiety disorder, unspecified: Secondary | ICD-10-CM | POA: Insufficient documentation

## 2018-10-09 DIAGNOSIS — E871 Hypo-osmolality and hyponatremia: Secondary | ICD-10-CM | POA: Diagnosis not present

## 2018-10-09 DIAGNOSIS — Z951 Presence of aortocoronary bypass graft: Secondary | ICD-10-CM | POA: Insufficient documentation

## 2018-10-09 DIAGNOSIS — R0902 Hypoxemia: Secondary | ICD-10-CM | POA: Diagnosis not present

## 2018-10-09 DIAGNOSIS — Z720 Tobacco use: Secondary | ICD-10-CM | POA: Diagnosis present

## 2018-10-09 DIAGNOSIS — I480 Paroxysmal atrial fibrillation: Secondary | ICD-10-CM | POA: Diagnosis not present

## 2018-10-09 DIAGNOSIS — Z8249 Family history of ischemic heart disease and other diseases of the circulatory system: Secondary | ICD-10-CM | POA: Insufficient documentation

## 2018-10-09 LAB — CBC WITH DIFFERENTIAL/PLATELET
Abs Immature Granulocytes: 0.04 10*3/uL (ref 0.00–0.07)
Basophils Absolute: 0.1 10*3/uL (ref 0.0–0.1)
Basophils Relative: 1 %
Eosinophils Absolute: 0.2 10*3/uL (ref 0.0–0.5)
Eosinophils Relative: 2 %
HCT: 39.6 % (ref 39.0–52.0)
HEMOGLOBIN: 13.6 g/dL (ref 13.0–17.0)
Immature Granulocytes: 0 %
LYMPHS PCT: 26 %
Lymphs Abs: 2.7 10*3/uL (ref 0.7–4.0)
MCH: 29.6 pg (ref 26.0–34.0)
MCHC: 34.3 g/dL (ref 30.0–36.0)
MCV: 86.3 fL (ref 80.0–100.0)
MONOS PCT: 10 %
Monocytes Absolute: 1 10*3/uL (ref 0.1–1.0)
NEUTROS PCT: 61 %
Neutro Abs: 6.1 10*3/uL (ref 1.7–7.7)
Platelets: 300 10*3/uL (ref 150–400)
RBC: 4.59 MIL/uL (ref 4.22–5.81)
RDW: 11.9 % (ref 11.5–15.5)
WBC: 10.2 10*3/uL (ref 4.0–10.5)
nRBC: 0 % (ref 0.0–0.2)

## 2018-10-09 LAB — LIPASE, BLOOD: Lipase: 56 U/L — ABNORMAL HIGH (ref 11–51)

## 2018-10-09 LAB — COMPREHENSIVE METABOLIC PANEL
ALT: 29 U/L (ref 0–44)
ANION GAP: 13 (ref 5–15)
AST: 28 U/L (ref 15–41)
Albumin: 3.7 g/dL (ref 3.5–5.0)
Alkaline Phosphatase: 59 U/L (ref 38–126)
BUN: 25 mg/dL — ABNORMAL HIGH (ref 8–23)
CO2: 25 mmol/L (ref 22–32)
Calcium: 8.8 mg/dL — ABNORMAL LOW (ref 8.9–10.3)
Chloride: 85 mmol/L — ABNORMAL LOW (ref 98–111)
Creatinine, Ser: 1.53 mg/dL — ABNORMAL HIGH (ref 0.61–1.24)
GFR calc Af Amer: 54 mL/min — ABNORMAL LOW (ref >60)
GFR calc non Af Amer: 47 mL/min — ABNORMAL LOW (ref >60)
Glucose, Bld: 96 mg/dL (ref 70–99)
POTASSIUM: 4 mmol/L (ref 3.5–5.1)
Sodium: 123 mmol/L — ABNORMAL LOW (ref 135–145)
Total Bilirubin: 0.7 mg/dL (ref 0.3–1.2)
Total Protein: 6.4 g/dL — ABNORMAL LOW (ref 6.5–8.1)

## 2018-10-09 LAB — TROPONIN I: Troponin I: <0.03 ng/mL (ref <0.03)

## 2018-10-09 MED ORDER — MORPHINE SULFATE (PF) 4 MG/ML IV SOLN
4.0000 mg | Freq: Once | INTRAVENOUS | Status: AC
  Administered 2018-10-09: 4 mg via INTRAVENOUS
  Filled 2018-10-09: qty 1

## 2018-10-09 MED ORDER — ALUM & MAG HYDROXIDE-SIMETH 200-200-20 MG/5ML PO SUSP
30.0000 mL | Freq: Once | ORAL | Status: AC
  Administered 2018-10-09: 30 mL via ORAL
  Filled 2018-10-09: qty 30

## 2018-10-09 NOTE — ED Provider Notes (Signed)
Medical screening examination/treatment/procedure(s) were conducted as a shared visit with non-physician practitioner(s) and myself.  I personally evaluated the patient during the encounter. Briefly, the patient is a 68 y.o. male with history of coronary artery disease status post CABG, history of AAA with recent evaluation with improvement on recent ultrasound, reflux, high cholesterol, hypertension, atrial fibrillation on Eliquis who presents the ED with chest pain.  Patient with overall unremarkable vitals.  Patient states about 2 hours prior to arrival he had sharp substernal chest pain with diaphoresis.  At first he thought it was reflux type pain but eventually took his nitroglycerin his pain got worse.  Patient took 3 doses of nitroglycerin and pain improved.  After third dose of nitroglycerin patient appeared to have low blood pressure when evaluated by EMS.  Did improve with fluids.  Overall asymptomatic upon my evaluation now.  Patient states that he does have a history of severe reflux but this pain was slightly different.  Did not have anything in particular different to eat today than normal.  Has normal pulses throughout.  No abdominal pain.  History and physical is not consistent with dissection.  However, patient  high risk for ACS.  EKG shows sinus rhythm.  No ischemic changes.  Initial troponin within normal limits.  Chest x-ray showed no obvious signs of pneumonia, pneumothorax, pleural effusion.  No significant leukocytosis.  Patient does have sodium of 123.  Creatinine of 1.53.  Otherwise no significant electrolyte abnormality.  Hyponatremia likely from diuretic use.  Overall we will discuss patient with cardiology and likely admit for further ACS rule out.  Doubt PE given history and physical.  Patient is also on anticoagulation.  Hemodynamically stable throughout my care.  This chart was dictated using voice recognition software.  Despite best efforts to proofread,  errors can occur which  can change the documentation meaning.     EKG Interpretation  Date/Time:  Wednesday October 09 2018 21:54:04 EST Ventricular Rate:  52 PR Interval:    QRS Duration: 110 QT Interval:  480 QTC Calculation: 447 R Axis:   81 Text Interpretation:  Sinus rhythm Consider left atrial enlargement Borderline right axis deviation Confirmed by Lennice Sites [phone]) on 10/09/2018 10:01:20 PM          Lennice Sites, DO 10/09/18 2317

## 2018-10-09 NOTE — ED Triage Notes (Addendum)
Pt arrived by EMS. Per Ems pt had chest pain tonight starting around 830pm. Pt took 3 nitro back to back and became unconscious with an episode of incontinence. Pt became conscious when EMS arrived. Pt received 278mL NS. Initial BP systolic 90. BP elevated after fluids to 120/60. Pt alert and oriented stating the first nitro helped a little bit so he continued to take more. Pt stated he has hx of GERD and feels like some of this midsternal chest pain feels like that.

## 2018-10-09 NOTE — ED Provider Notes (Signed)
[facility] EMERGENCY DEPARTMENT Provider Note   CSN: [id number] Arrival date & time: 10/09/18  2146    History   Chief Complaint Chief Complaint  Patient presents with  . Chest Pain    HPI Tom [name] is a 68 y.o. male with a past medical history of AAA status post endovascular repair, A. fib, hypertension, high cholesterol, skin cancer, tobacco abuse, who presents today for evaluation of chest pain.  At around 8 PM tonight he was seated in his recliner when he started to develop mid to left-sided chest pain.  He reports that the pain is more severe than he has ever had from his heartburn before.  He started taking nitro, after the first one he felt a little better, then he took a second with out additional relief so he immediately took a 3rd after that one dissolved.  His wife, who is at bedside, reports that he was diaphoretic prior to taking the nitro.  After he took the third nitro he says that he felt a numbness wash down his body and felt very weak.  His wife reports that he then passed out, he did not fall or strike his head, was out for under a minute and was incontinent.  When he woke up he was slightly confused however that went away in approximately 3 minutes.  There was no twitching or jerking during the syncopal event however his wife notes that he was significantly pale/gray.    According to triage when EMS got there patient had initial blood pressure of 90 systolic, he was given 323 mL normal saline after which his blood pressure elevated up to 120/60.  His pain has not radiated or moved.  Denies any abdominal pain.  He was nauseous earlier.  He currently states his pain is a 2-3 out of 10.     HPI  Past Medical History:  Diagnosis Date  . AAA (abdominal aortic aneurysm) ([place])   . Anxiety   . Cancer [facility]) 2013   skin cancer,   MOHS surgery  . Coronary artery disease   . Dental bridge present    upper  . Dental crowns present   . Family history of  adverse reaction to anesthesia    pt's mother has hx. of post-op N/V  . Foreign body of finger of left hand 12/2014   index finger  . GERD (gastroesophageal reflux disease)   . Hypercholesteremia   . Hypertension    states under control with med., has been on med. x 20 yr.  . Myocardial infarction ([place]) 1997  . PAF (paroxysmal atrial fibrillation) ([place])   . Wears glasses     Patient Active Problem List   Diagnosis Date Noted  . Chronic pain of left knee 02/05/2018  . Pain of right scapula 02/05/2017  . Atrial fibrillation ([place]) 07/11/2013  . HTN (hypertension) 02/26/2013  . Renal artery stenosis ([place]) 12/19/2012  . Aftercare following surgery of the circulatory system, [place] 07/11/2012  . Abdominal aneurysm without mention of rupture 06/06/2012  . AAA (abdominal aortic aneurysm) ([place]) 05/23/2012  . CAD (coronary artery disease) 01/27/2011  . Tobacco abuse 01/27/2011  . Hypercholesterolemia 01/27/2011  . DIARRHEA 06/17/2010    Past Surgical History:  Procedure Laterality Date  . ABDOMINAL AORTIC ANEURYSM REPAIR  06/12/2012  . CARDIAC CATHETERIZATION  10/03/2005  . COLONOSCOPY    . CORONARY ARTERY BYPASS GRAFT  10/16/2005   LIMA to LAD saphenous vein to RCA  . FOREIGN BODY REMOVAL  Left 01/08/2015   Procedure: EXCISION FOREIGN BODY ;  Surgeon: Daryll Brod, MD;  Location: [place];  Service: Orthopedics;  Laterality: Left;  ANESTHESIA:  GENERAL, IV REGIONAL UPPER ARM  . I&D EXTREMITY Left 11/12/2014   Procedure: IRRIGATION AND DEBRIDEMENT LEFT INDEX FINGER;  Surgeon: Daryll Brod, MD;  Location: [place];  Service: Orthopedics;  Laterality: Left;  . INCISION AND DRAINAGE ABSCESS Left 12/22/2014   Procedure: INCISION AND DRAINAGE ABSCESS LEFT INDEX FINGER;  Surgeon: Daryll Brod, MD;  Location: [place];  Service: Orthopedics;  Laterality: Left;  . PERCUTANEOUS STENT INTERVENTION Left 08/09/2012   Procedure: PERCUTANEOUS STENT INTERVENTION;   Surgeon: Elam Dutch, MD;  Location: [facility] CATH LAB;  Service: Cardiovascular;  Laterality: Left;  stent to lt renal artery  . RENAL ANGIOGRAM N/A 08/09/2012   Procedure: RENAL ANGIOGRAM;  Surgeon: Elam Dutch, MD;  Location: [facility] CATH LAB;  Service: Cardiovascular;  Laterality: N/A;        Home Medications    Prior to Admission medications   Medication Sig Start Date End Date Taking? Authorizing Provider  alprazolam Duanne Moron) 2 MG tablet Take 2 mg by mouth at bedtime.   Yes [provider]  apixaban (ELIQUIS) 5 MG TABS tablet Take 1 tablet (5 mg total) by mouth 2 (two) times daily. 06/20/18  Yes Nahser, Wonda Cheng, MD  aspirin 81 MG tablet Take 1 tablet (81 mg total) by mouth daily. 07/02/13  Yes Nahser, Wonda Cheng, MD  atorvastatin (LIPITOR) 40 MG tablet Take 1 tablet (40 mg total) by mouth daily. 06/13/16  Yes Nahser, Wonda Cheng, MD  chlorthalidone (HYGROTON) 25 MG tablet Take 25 mg by mouth daily.  05/06/18  Yes [provider]  dronedarone (MULTAQ) 400 MG tablet Take 400 mg by mouth daily.   Yes [provider]  metoprolol succinate (TOPROL-XL) 50 MG 24 hr tablet TAKE 1 TABLET EVERY DAY  WITH  OR  IMMEDIATELY  FOLLOWING A MEAL Patient taking differently: Take 50 mg by mouth daily.  07/01/18  Yes Nahser, Wonda Cheng, MD  olmesartan (BENICAR) 20 MG tablet Take 1 tablet by mouth daily. 05/07/18  Yes [provider]  pantoprazole (PROTONIX) 40 MG tablet Take 40 mg by mouth daily.     Yes [provider]    Family History Family History  Problem Relation Age of Onset  . Heart disease Father        before age 55  . Hypertension Father   . Hyperlipidemia Father   . Diabetes Father   . Heart attack Father   . Heart disease Mother   . Hypertension Mother   . Hyperlipidemia Mother   . Anesthesia problems Mother        hx. of post-op N/V  . AAA (abdominal aortic aneurysm) Maternal Uncle   . Cancer Maternal Uncle   . Hyperlipidemia Sister      Social History Social History   Tobacco Use  . Smoking status: Current Every Day Smoker    Packs/day: 0.50    Years: 44.00    Pack years: 22.00    Types: Cigarettes  . Smokeless tobacco: Never Used  Substance Use Topics  . Alcohol use: No    Alcohol/week: 0.0 standard drinks  . Drug use: No     Allergies   Niacin and related; Codeine; and Niacin   Review of Systems Review of Systems  Constitutional: Positive for diaphoresis. Negative for chills and fever.  Respiratory: Positive  for shortness of breath (When pain was bad. ). Negative for cough.   Cardiovascular: Positive for chest pain. Negative for palpitations and leg swelling.  Gastrointestinal: Positive for nausea. Negative for abdominal pain and vomiting.  Genitourinary: Negative for dysuria and hematuria.  Musculoskeletal: Negative for back pain and neck pain.  Skin: Negative for rash.  Neurological: Positive for syncope. Negative for tremors and weakness.  All other systems reviewed and are negative.    Physical Exam Updated Vital Signs BP 138/75   Pulse (!) 56   Temp 98.1 F (36.7 C) (Oral)   Resp 16   SpO2 97%   Physical Exam Vitals signs and nursing note reviewed.  Constitutional:      General: He is not in acute distress.    Appearance: He is well-developed.  HENT:     Head: Normocephalic and atraumatic.  Eyes:     Conjunctiva/sclera: Conjunctivae normal.     Pupils: Pupils are equal, round, and reactive to light.  Neck:     Musculoskeletal: Normal range of motion and neck supple.     Vascular: No JVD.  Cardiovascular:     Rate and Rhythm: Regular rhythm. Bradycardia present.     Pulses:          Radial pulses are 2+ on the right side and 2+ on the left side.       Dorsalis pedis pulses are 2+ on the right side and 2+ on the left side.       Posterior tibial pulses are 2+ on the right side and 2+ on the left side.     Heart sounds: Normal heart sounds. Heart sounds not distant. No murmur.   Pulmonary:     Effort: Pulmonary effort is normal. No respiratory distress.     Breath sounds: Normal breath sounds. No decreased breath sounds, wheezing, rhonchi or rales.  Abdominal:     General: Bowel sounds are normal.     Palpations: Abdomen is soft. There is no mass.     Tenderness: There is no abdominal tenderness. There is no guarding.  Musculoskeletal:     Right lower leg: He exhibits no tenderness. No edema.     Left lower leg: He exhibits no tenderness. No edema.     Comments: 5/5 strength in bilateral upper and lower extremities.   Skin:    General: Skin is warm and dry.  Neurological:     General: No focal deficit present.     Mental Status: He is alert and oriented to person, place, and time.  Psychiatric:        Mood and Affect: Mood normal.        Behavior: Behavior normal.      ED Treatments / Results  Labs (all labs ordered are listed, but only abnormal results are displayed) Labs Reviewed  COMPREHENSIVE METABOLIC PANEL - Abnormal; Notable for the following components:      Result Value   Sodium 123 (*)    Chloride 85 (*)    BUN 25 (*)    Creatinine, Ser 1.53 (*)    Calcium 8.8 (*)    Total Protein 6.4 (*)    GFR calc non Af Amer 47 (*)    GFR calc Af Amer 54 (*)    All other components within normal limits  LIPASE, BLOOD - Abnormal; Notable for the following components:   Lipase 56 (*)    All other components within normal limits  CBC WITH DIFFERENTIAL/PLATELET  TROPONIN I  EKG EKG Interpretation  Date/Time:  Wednesday October 09 2018 21:54:04 EST Ventricular Rate:  52 PR Interval:    QRS Duration: 110 QT Interval:  480 QTC Calculation: 447 R Axis:   81 Text Interpretation:  Sinus rhythm Consider left atrial enlargement Borderline right axis deviation Confirmed by Lennice Sites [phone]) on 10/09/2018 10:01:20 PM   Radiology Dg Chest Portable 1 View  Result Date: 10/09/2018 CLINICAL DATA:  Severe mid chest pain onset in onset this  evening dyspnea. EXAM: PORTABLE CHEST 1 VIEW COMPARISON:  01/05/2017 FINDINGS: The patient is status post CABG. The heart size and mediastinal contours are within normal limits. Aortic atherosclerosis is noted at the arch without aneurysm. Mild emphysematous change of lungs without alveolar consolidation or CHF. No effusion or pneumothorax. The visualized skeletal structures are unremarkable. IMPRESSION: No active disease.  Mild pulmonary hyperinflation. Electronically Signed   By: Ashley Royalty M.D.   On: 10/09/2018 23:02    Procedures Procedures (including critical care time)  Medications Ordered in ED Medications  alum & mag hydroxide-simeth (MAALOX/MYLANTA) 200-200-20 MG/5ML suspension 30 mL (30 mLs Oral Given 10/09/18 2341)  morphine 4 MG/ML injection 4 mg (4 mg Intravenous Given 10/09/18 2342)     Initial Impression / Assessment and Plan / ED Course  I have reviewed the triage vital signs and the nursing notes.  Pertinent labs & imaging results that were available during my care of the patient were reviewed by me and considered in my medical decision making (see chart for details).       MARQUIZ SOTELO presents today for evaluation of chest pain with subsequent syncopal event.  He was at home and started having chest pain at 8:30 PM.  He then took 3 doses of nitroglycerin after which he had an episode of incontinence and syncope.  On initial evaluation he reports his chest pain is about a 3 or 4, does not radiate or move.  He was diaphoretic, and nauseous earlier.  He denies any recent illness.  He has a history of AAA repair, however recent imaging shows graft is in correct place and aneurysm is getting smaller.  He has good peripheral pulses and no abdominal or lower back pain, therefore do not suspect dissection or rupture.    Initial EKG with mild bradycardia, other wise no abnormalities.    CXR mild pulmonary hyperinflation with no acute findings.    Labs appear mostly consistent with  his baseline.  Creatinine is stably elevated at 1.53.  Patient is hyponatremic with a sodium of 123.  Chloride is also low at 85.  Lipase is very slightly high at 56 however I do not suspect this is not significant enough to cause symptoms.   Initial troponin is not elevated.    Cardiology consulted. I spoke with Dr. [place] Lions from cardiology who states that if patient has not had his Eliquis tonight then would recommend heparinizing him, however patient has taken his Eliquis tonight so therefore no need to heparinize patient.  He recommends medical admission due to hyponatremia.  This patient was seen as a shared visit with Dr. Ronnald Nian.   I spoke with Hospitalist  who will come see patient for admission.   The patient appears reasonably stabilized for admission considering the current resources, flow, and capabilities available in the ED at this time, and I doubt any other [facility] requiring further screening and/or treatment in the ED prior to admission.   Final Clinical Impressions(s) / ED Diagnoses   Final diagnoses:  Chest pain, unspecified type  Syncope, unspecified syncope type    ED Discharge Orders    None       Lorin Glass, Hershal Coria 10/11/18 0034    Lennice Sites, DO 10/11/18 [phone]

## 2018-10-09 NOTE — ED Notes (Signed)
ED Provider at bedside. 

## 2018-10-10 ENCOUNTER — Observation Stay (HOSPITAL_BASED_OUTPATIENT_CLINIC_OR_DEPARTMENT_OTHER): Payer: Medicare Other

## 2018-10-10 DIAGNOSIS — E7800 Pure hypercholesterolemia, unspecified: Secondary | ICD-10-CM

## 2018-10-10 DIAGNOSIS — R079 Chest pain, unspecified: Secondary | ICD-10-CM | POA: Diagnosis not present

## 2018-10-10 DIAGNOSIS — Z720 Tobacco use: Secondary | ICD-10-CM

## 2018-10-10 DIAGNOSIS — I2510 Atherosclerotic heart disease of native coronary artery without angina pectoris: Secondary | ICD-10-CM

## 2018-10-10 DIAGNOSIS — I361 Nonrheumatic tricuspid (valve) insufficiency: Secondary | ICD-10-CM

## 2018-10-10 DIAGNOSIS — R55 Syncope and collapse: Secondary | ICD-10-CM

## 2018-10-10 DIAGNOSIS — E871 Hypo-osmolality and hyponatremia: Secondary | ICD-10-CM

## 2018-10-10 DIAGNOSIS — R072 Precordial pain: Secondary | ICD-10-CM

## 2018-10-10 HISTORY — DX: Syncope and collapse: R55

## 2018-10-10 HISTORY — DX: Hypo-osmolality and hyponatremia: E87.1

## 2018-10-10 LAB — HIV ANTIBODY (ROUTINE TESTING W REFLEX): HIV Screen 4th Generation wRfx: NONREACTIVE

## 2018-10-10 LAB — BASIC METABOLIC PANEL
ANION GAP: 11 (ref 5–15)
Anion gap: 11 (ref 5–15)
Anion gap: 10 (ref 5–15)
BUN: 26 mg/dL — ABNORMAL HIGH (ref 8–23)
BUN: 24 mg/dL — ABNORMAL HIGH (ref 8–23)
BUN: 24 mg/dL — ABNORMAL HIGH (ref 8–23)
CHLORIDE: 84 mmol/L — AB (ref 98–111)
CO2: 25 mmol/L (ref 22–32)
CO2: 26 mmol/L (ref 22–32)
CO2: 26 mmol/L (ref 22–32)
Calcium: 8.8 mg/dL — ABNORMAL LOW (ref 8.9–10.3)
Calcium: 8.6 mg/dL — ABNORMAL LOW (ref 8.9–10.3)
Calcium: 8.7 mg/dL — ABNORMAL LOW (ref 8.9–10.3)
Chloride: 86 mmol/L — ABNORMAL LOW (ref 98–111)
Chloride: 88 mmol/L — ABNORMAL LOW (ref 98–111)
Creatinine, Ser: 1.42 mg/dL — ABNORMAL HIGH (ref 0.61–1.24)
Creatinine, Ser: 1.57 mg/dL — ABNORMAL HIGH (ref 0.61–1.24)
Creatinine, Ser: 1.41 mg/dL — ABNORMAL HIGH (ref 0.61–1.24)
GFR calc Af Amer: 59 mL/min — ABNORMAL LOW (ref >60)
GFR calc Af Amer: 52 mL/min — ABNORMAL LOW (ref >60)
GFR calc Af Amer: 60 mL/min — ABNORMAL LOW (ref >60)
GFR calc non Af Amer: 51 mL/min — ABNORMAL LOW (ref >60)
GFR calc non Af Amer: 45 mL/min — ABNORMAL LOW (ref >60)
GFR calc non Af Amer: 52 mL/min — ABNORMAL LOW (ref >60)
Glucose, Bld: 100 mg/dL — ABNORMAL HIGH (ref 70–99)
Glucose, Bld: 122 mg/dL — ABNORMAL HIGH (ref 70–99)
Glucose, Bld: 100 mg/dL — ABNORMAL HIGH (ref 70–99)
Potassium: 4.6 mmol/L (ref 3.5–5.1)
Potassium: 4 mmol/L (ref 3.5–5.1)
Potassium: 4 mmol/L (ref 3.5–5.1)
Sodium: 122 mmol/L — ABNORMAL LOW (ref 135–145)
Sodium: 121 mmol/L — ABNORMAL LOW (ref 135–145)
Sodium: 124 mmol/L — ABNORMAL LOW (ref 135–145)

## 2018-10-10 LAB — TROPONIN I
Troponin I: <0.03 ng/mL (ref <0.03)
Troponin I: <0.03 ng/mL (ref <0.03)

## 2018-10-10 LAB — ECHOCARDIOGRAM COMPLETE

## 2018-10-10 LAB — OSMOLALITY: Osmolality: 267 mOsm/kg — ABNORMAL LOW (ref 275–295)

## 2018-10-10 MED ORDER — ATORVASTATIN CALCIUM 40 MG PO TABS: 40.0000 mg | ORAL_TABLET | Freq: Every day | ORAL | Status: DC

## 2018-10-10 MED ORDER — HEPARIN SODIUM (PORCINE) 5000 UNIT/ML IJ SOLN: 5000.0000 [IU] | Freq: Three times a day (TID) | INTRAMUSCULAR | Status: DC

## 2018-10-10 MED ORDER — SODIUM CHLORIDE 0.9 % IV SOLN: INTRAVENOUS | Status: DC

## 2018-10-10 MED ORDER — ONDANSETRON HCL 4 MG/2ML IJ SOLN: 4.0000 mg | Freq: Four times a day (QID) | INTRAMUSCULAR | Status: DC | PRN

## 2018-10-10 MED ORDER — SODIUM CHLORIDE 0.9 % IV SOLN
INTRAVENOUS | Status: AC
  Administered 2018-10-10: 02:00:00 via INTRAVENOUS

## 2018-10-10 MED ORDER — APIXABAN 5 MG PO TABS
5.0000 mg | ORAL_TABLET | Freq: Two times a day (BID) | ORAL | Status: DC
  Filled 2018-10-10: qty 1

## 2018-10-10 MED ORDER — MORPHINE SULFATE (PF) 2 MG/ML IV SOLN: 1.0000 mg | INTRAVENOUS | Status: DC | PRN

## 2018-10-10 MED ORDER — ACETAMINOPHEN 325 MG PO TABS: 650.0000 mg | ORAL_TABLET | ORAL | Status: DC | PRN

## 2018-10-10 MED ORDER — ALUM & MAG HYDROXIDE-SIMETH 200-200-20 MG/5ML PO SUSP: 30.0000 mL | Freq: Four times a day (QID) | ORAL | Status: DC | PRN

## 2018-10-10 MED ORDER — PANTOPRAZOLE SODIUM 40 MG PO TBEC
40.0000 mg | DELAYED_RELEASE_TABLET | Freq: Every day | ORAL | Status: DC
  Administered 2018-10-10: 40 mg via ORAL
  Filled 2018-10-10: qty 1

## 2018-10-10 MED ORDER — ALPRAZOLAM 0.25 MG PO TABS
2.0000 mg | ORAL_TABLET | Freq: Every day | ORAL | Status: DC
  Administered 2018-10-10: 2 mg via ORAL
  Filled 2018-10-10: qty 8

## 2018-10-10 MED ORDER — ASPIRIN 81 MG PO CHEW
81.0000 mg | CHEWABLE_TABLET | Freq: Every day | ORAL | Status: DC
  Administered 2018-10-10: 81 mg via ORAL
  Filled 2018-10-10: qty 1

## 2018-10-10 MED ORDER — ASPIRIN 81 MG PO CHEW
324.0000 mg | CHEWABLE_TABLET | Freq: Once | ORAL | Status: AC
  Administered 2018-10-10: 324 mg via ORAL
  Filled 2018-10-10: qty 4

## 2018-10-10 NOTE — ED Notes (Signed)
Echo at bedside

## 2018-10-10 NOTE — Progress Notes (Signed)
Progress Note  Patient Name: Tom [name] Date of Encounter: 10/10/2018  Primary Cardiologist: Mertie Moores, MD  Subjective   No chest pain since receiving Maalox. He feels at baseline. Wants to go home  Inpatient Medications    Scheduled Meds: . alprazolam  2 mg Oral QHS  . aspirin  81 mg Oral Daily  . atorvastatin  40 mg Oral q1800  . heparin injection (subcutaneous)  5,000 Units Subcutaneous Q8H  . pantoprazole  40 mg Oral Daily   Continuous Infusions: . sodium chloride 150 mL/hr at 10/10/18 0701   PRN Meds: acetaminophen, alum & mag hydroxide-simeth, morphine injection, ondansetron (ZOFRAN) IV   Vital Signs    Vitals:   10/10/18 0045 10/10/18 0100 10/10/18 0813 10/10/18 0953  BP: (!) 170/81 (!) 135/98 137/69 (!) 166/67  Pulse: (!) 52 (!) 55 (!) 58 (!) 55  Resp: 16 14 15 12   Temp:      TempSrc:      SpO2: 96% 95% 96% 96%   No intake or output data in the 24 hours ending 10/10/18 1136 Last 3 Weights 08/30/2018 07/12/2018 06/27/2018  Weight (lbs) 169 lb 169 lb 166 lb  Weight (kg) 76.658 kg 76.658 kg 75.297 kg      Telemetry    Sinus brady - Personally Reviewed  ECG    Sinus, no ischemic changes - Personally Reviewed  Physical Exam   GEN: No acute distress.   Neck: No JVD Cardiac: RRR, no murmurs, rubs, or gallops.  Respiratory: Clear to auscultation bilaterally. GI: Soft, nontender, non-distended  MS: No edema; No deformity. Neuro:  Nonfocal  Psych: Normal affect   Labs    Chemistry Recent Labs  Lab 10/09/18 2210 10/10/18 0033 10/10/18 0429 10/10/18 0745  NA 123* 122* 121* 124*  K 4.0 4.6 4.0 4.0  CL 85* 86* 84* 88*  CO2 25 25 26 26   GLUCOSE 96 100* 122* 100*  BUN 25* 26* 24* 24*  CREATININE 1.53* 1.42* 1.57* 1.41*  CALCIUM 8.8* 8.8* 8.6* 8.7*  PROT 6.4*  --   --   --   ALBUMIN 3.7  --   --   --   AST 28  --   --   --   ALT 29  --   --   --   ALKPHOS 59  --   --   --   BILITOT 0.7  --   --   --   GFRNONAA 47* 51* 45* 52*  GFRAA  54* 59* 52* 60*  ANIONGAP 13 11 11 10      Hematology Recent Labs  Lab 10/09/18 2210  WBC 10.2  RBC 4.59  HGB 13.6  HCT 39.6  MCV 86.3  MCH 29.6  MCHC 34.3  RDW 11.9  PLT 300    Cardiac Enzymes Recent Labs  Lab 10/09/18 2210 10/10/18 0033 10/10/18 0745  TROPONINI <0.03 <0.03 <0.03   No results for input(s): TROPIPOC in the last 168 hours.   BNPNo results for input(s): BNP, PROBNP in the last 168 hours.   DDimer No results for input(s): DDIMER in the last 168 hours.   Radiology    Dg Chest Portable 1 View  Result Date: 10/09/2018 CLINICAL DATA:  Severe mid chest pain onset in onset this evening dyspnea. EXAM: PORTABLE CHEST 1 VIEW COMPARISON:  01/05/2017 FINDINGS: The patient is status post CABG. The heart size and mediastinal contours are within normal limits. Aortic atherosclerosis is noted at the arch without aneurysm. Mild emphysematous change  of lungs without alveolar consolidation or CHF. No effusion or pneumothorax. The visualized skeletal structures are unremarkable. IMPRESSION: No active disease.  Mild pulmonary hyperinflation. Electronically Signed   By: Ashley Royalty M.D.   On: 10/09/2018 23:02    Cardiac Studies   Echo 10/10/18:  1. The left ventricle has normal systolic function with an ejection fraction of 60-65%. The cavity size was normal. There is mild asymmetric left ventricular hypertrophy of the basal anteroseptal wall. Left ventricular diastolic Doppler parameters are  consistent with impaired relaxation No evidence of left ventricular regional wall motion abnormalities.  2. The right ventricle has normal systolic function. The cavity was normal. There is no increase in right ventricular wall thickness.  3. The mitral valve is degenerative. Mild thickening of the mitral valve leaflet. There is moderate mitral annular calcification present.  4. The tricuspid valve is normal in structure.  5. The aortic valve is tricuspid Mild thickening of the aortic  valve Moderate calcification of the aortic valve.  6. The pulmonic valve was normal in structure.  7. No evidence of left ventricular regional wall motion abnormalities.  Patient Profile     68 y.o. male with history of CAD s/p CABG, AAA, GERD, and atrial fib on Eliquis admitted with chest pain. Troponin negative. No EKG changes.   Assessment & Plan    1. CAD s/p CABG with chest pain: He felt like his pain was consistent with GERD. Chest pain resolved with Maalox and morphine. He has had no exertional chest pain at home. Currently pain free. Echo reviewed by me and shows normal LV systolic function and no wall motion abnormalities. No objective evidence of ischemia. I would not recommend further inpatient cardiac testing. We will arrange follow up with Dr. Acie Fredrickson over the next week. He may elect to arrange an outpatient stress myoview.   2. Hyponatremia: management per primary team.     For questions or updates, please contact [place] Please consult [email or web address] for contact info under        Signed, Lauree Chandler, MD  10/10/2018, 11:36 AM

## 2018-10-10 NOTE — Care Management Obs Status (Signed)
[place] NOTIFICATION   Patient Details  Name: Tom [name] MRN: [id number] Date of Birth: 27-Dec-1950   Medicare Observation Status Notification Given:  Yes    Fuller Mandril, RN 10/10/2018, 4:01 PM

## 2018-10-10 NOTE — Consult Note (Signed)
Cardiology Consultation Note    Patient ID: Tom [name], MRN: [id number], DOB/AGE: 67-Jun-1952 67 y.o. Admit date: 10/09/2018   Date of Consult: 10/10/2018 Primary Physician: Asencion Noble, MD Primary Cardiologist: Dr. Acie Fredrickson  Reason for Consultation: Chest pain Requesting MD: Dr. Ronnald Nian  HPI: Tom [name] is a 68 y.o. male with a history of CAD s/p CABG, AAA, GERD, AF on Eliquis, who presents with CP.  Pt was sitting at home watching TV, when felt the onset of substernal chest pain that felt like a very severe GERD attack.  He rapidly took three nitro in succession, felt a brief pre-syncopal prodrome and passed out for a short period of time.  EMS was called and the patient was mildly hypotensive initially, but this quickly responded to IVF.  In the ED, the patient's HR has been in the 50s, and he has remained normotensive.  Labs were notable for Na 123, negative initial troponin and Cr 1.5 (near recent baseline).  ECG showed no acute ischemic changes.  He was admitted to the hospitalist service for further management.  Upon my interview, the patient is completely chest pain free after receiving Maalox and IV morphine.  Past Medical History:  Diagnosis Date  . AAA (abdominal aortic aneurysm) ([place])   . Anxiety   . Cancer [facility]) 2013   skin cancer,   MOHS surgery  . Coronary artery disease   . Dental bridge present    upper  . Dental crowns present   . Family history of adverse reaction to anesthesia    pt's mother has hx. of post-op N/V  . Foreign body of finger of left hand 12/2014   index finger  . GERD (gastroesophageal reflux disease)   . Hypercholesteremia   . Hypertension    states under control with med., has been on med. x 20 yr.  . Myocardial infarction ([place]) 1997  . PAF (paroxysmal atrial fibrillation) ([place])   . Wears glasses       Surgical History:  Past Surgical History:  Procedure Laterality Date  . ABDOMINAL AORTIC ANEURYSM REPAIR  06/12/2012  . CARDIAC  CATHETERIZATION  10/03/2005  . COLONOSCOPY    . CORONARY ARTERY BYPASS GRAFT  10/16/2005   LIMA to LAD saphenous vein to RCA  . FOREIGN BODY REMOVAL Left 01/08/2015   Procedure: EXCISION FOREIGN BODY ;  Surgeon: Daryll Brod, MD;  Location: [place];  Service: Orthopedics;  Laterality: Left;  ANESTHESIA:  GENERAL, IV REGIONAL UPPER ARM  . I&D EXTREMITY Left 11/12/2014   Procedure: IRRIGATION AND DEBRIDEMENT LEFT INDEX FINGER;  Surgeon: Daryll Brod, MD;  Location: [place];  Service: Orthopedics;  Laterality: Left;  . INCISION AND DRAINAGE ABSCESS Left 12/22/2014   Procedure: INCISION AND DRAINAGE ABSCESS LEFT INDEX FINGER;  Surgeon: Daryll Brod, MD;  Location: [place];  Service: Orthopedics;  Laterality: Left;  . PERCUTANEOUS STENT INTERVENTION Left 08/09/2012   Procedure: PERCUTANEOUS STENT INTERVENTION;  Surgeon: Elam Dutch, MD;  Location: [facility] CATH LAB;  Service: Cardiovascular;  Laterality: Left;  stent to lt renal artery  . RENAL ANGIOGRAM N/A 08/09/2012   Procedure: RENAL ANGIOGRAM;  Surgeon: Elam Dutch, MD;  Location: [facility] CATH LAB;  Service: Cardiovascular;  Laterality: N/A;     Home Meds: Prior to Admission medications   Medication Sig Start Date End Date Taking? Authorizing Provider  alprazolam Duanne Moron) 2 MG tablet Take 2 mg by mouth at bedtime.   Yes [provider]  apixaban (ELIQUIS) 5 MG TABS tablet Take 1 tablet (5 mg total) by mouth 2 (two) times daily. 06/20/18  Yes Nahser, Wonda Cheng, MD  aspirin 81 MG tablet Take 1 tablet (81 mg total) by mouth daily. 07/02/13  Yes Nahser, Wonda Cheng, MD  atorvastatin (LIPITOR) 40 MG tablet Take 1 tablet (40 mg total) by mouth daily. 06/13/16  Yes Nahser, Wonda Cheng, MD  chlorthalidone (HYGROTON) 25 MG tablet Take 25 mg by mouth daily.  05/06/18  Yes [provider]  dronedarone (MULTAQ) 400 MG tablet Take 400 mg by mouth daily.   Yes [provider]  metoprolol succinate  (TOPROL-XL) 50 MG 24 hr tablet TAKE 1 TABLET EVERY DAY  WITH  OR  IMMEDIATELY  FOLLOWING A MEAL Patient taking differently: Take 50 mg by mouth daily.  07/01/18  Yes Nahser, Wonda Cheng, MD  olmesartan (BENICAR) 20 MG tablet Take 1 tablet by mouth daily. 05/07/18  Yes [provider]  pantoprazole (PROTONIX) 40 MG tablet Take 40 mg by mouth daily.     Yes [provider]    Inpatient Medications:  . alprazolam  2 mg Oral QHS  . apixaban  5 mg Oral BID  . aspirin  81 mg Oral Daily  . atorvastatin  40 mg Oral q1800  . pantoprazole  40 mg Oral Daily   . sodium chloride      Allergies:  Allergies  Allergen Reactions  . Niacin And Related Hives  . Codeine Other (See Comments)    GI upset ONLY in the cough syrup   . Niacin Hives    Social History   Socioeconomic History  . Marital status: Married    Spouse name: Not on file  . Number of children: 1 s  . Years of education: Not on file  . Highest education level: Not on file  Occupational History  . Not on file  Social Needs  . Financial resource strain: Not on file  . Food insecurity:    Worry: Not on file    Inability: Not on file  . Transportation needs:    Medical: Not on file    Non-medical: Not on file  Tobacco Use  . Smoking status: Current Every Day Smoker    Packs/day: 0.50    Years: 44.00    Pack years: 22.00    Types: Cigarettes  . Smokeless tobacco: Never Used  Substance and Sexual Activity  . Alcohol use: No    Alcohol/week: 0.0 standard drinks  . Drug use: No  . Sexual activity: Yes  Lifestyle  . Physical activity:    Days per week: Not on file    Minutes per session: Not on file  . Stress: Not on file  Relationships  . Social connections:    Talks on phone: Not on file    Gets together: Not on file    Attends religious service: Not on file    Active member of club or organization: Not on file    Attends meetings of clubs or organizations: Not on file    Relationship status: Not  on file  . Intimate partner violence:    Fear of current or ex partner: Not on file    Emotionally abused: Not on file    Physically abused: Not on file    Forced sexual activity: Not on file  Other Topics Concern  . Not on file  Social History Narrative  . Not on file     Family History  Problem Relation Age of Onset  . Heart disease Father        before age 48  . Hypertension Father   . Hyperlipidemia Father   . Diabetes Father   . Heart attack Father   . Heart disease Mother   . Hypertension Mother   . Hyperlipidemia Mother   . Anesthesia problems Mother        hx. of post-op N/V  . AAA (abdominal aortic aneurysm) Maternal Uncle   . Cancer Maternal Uncle   . Hyperlipidemia Sister      Review of Systems: All other systems reviewed and are otherwise negative except as noted above.  Labs: Recent Labs    10/09/18 2210  TROPONINI <0.03   Lab Results  Component Value Date   WBC 10.2 10/09/2018   HGB 13.6 10/09/2018   HCT 39.6 10/09/2018   MCV 86.3 10/09/2018   PLT 300 10/09/2018    Recent Labs  Lab 10/09/18 2210  NA 123*  K 4.0  CL 85*  CO2 25  BUN 25*  CREATININE 1.53*  CALCIUM 8.8*  PROT 6.4*  BILITOT 0.7  ALKPHOS 59  ALT 29  AST 28  GLUCOSE 96   Lab Results  Component Value Date   CHOL 118 06/20/2018   HDL 33 (L) 06/20/2018   LDLCALC 66 06/20/2018   TRIG 94 06/20/2018   No results found for: DDIMER  Radiology/Studies:  Dg Chest Portable 1 View  Result Date: 10/09/2018 CLINICAL DATA:  Severe mid chest pain onset in onset this evening dyspnea. EXAM: PORTABLE CHEST 1 VIEW COMPARISON:  01/05/2017 FINDINGS: The patient is status post CABG. The heart size and mediastinal contours are within normal limits. Aortic atherosclerosis is noted at the arch without aneurysm. Mild emphysematous change of lungs without alveolar consolidation or CHF. No effusion or pneumothorax. The visualized skeletal structures are unremarkable. IMPRESSION: No active  disease.  Mild pulmonary hyperinflation. Electronically Signed   By: Ashley Royalty M.D.   On: 10/09/2018 23:02    Wt Readings from Last 3 Encounters:  08/30/18 76.7 kg  07/12/18 76.7 kg  06/27/18 75.3 kg    EKG: Sinus bradycardia, no acute ischemic changes.  Physical Exam: Blood pressure (!) 159/78, pulse (!) 58, temperature 98.1 F (36.7 C), temperature source Oral, resp. rate 13, SpO2 96 %. There is no height or weight on file to calculate BMI. General: Well developed, well nourished, in no acute distress. Head: Normocephalic, atraumatic, sclera non-icteric, no xanthomas, nares are without discharge.  Neck: Negative for carotid bruits. JVD not elevated. Lungs: Clear bilaterally to auscultation without wheezes, rales, or rhonchi. Breathing is unlabored. Heart: RRR with S1 S2. No murmurs, rubs, or gallops appreciated. Abdomen: Soft, non-tender, non-distended with normoactive bowel sounds. No hepatomegaly. No rebound/guarding. No obvious abdominal masses. Msk:  Strength and tone appear normal for age. Extremities: No clubbing or cyanosis. No edema.  Distal pedal pulses are 2+ and equal bilaterally. Neuro: Alert and oriented X 3. No facial asymmetry. No focal deficit. Moves all extremities spontaneously. Psych:  Responds to questions appropriately with a normal affect.     Assessment and Plan  [phone] with CAD s/p CABG, pAF on Eliquis who presents with substernal chest pain, and subsequent syncopal episode after taking 3 sublingual nitroglycerin.  1.  Chest pain: Initially described as similar to his reflux symptoms but more severe.  Did get some relief from nitro.  Plan to cycle enzmyes and consider stress if negative.  Given atypical symptoms, would plan to  hold off on heparin gtt for now.  Would plan to hold Eliquis while cycling CBMs.  2.  Syncopal episode: Likely related to 3 doses of nitro in rapid succession.  Plan to monitor on telemetry.  3.  PAF:  Hold Eliquis while ruling out  ACS.  Signed, Doylene Canning, MD 10/10/2018, 12:55 AM

## 2018-10-10 NOTE — ED Notes (Signed)
Jeniya(SR)Lunch Tray Ordered @ 1142-per RN-called by Levada Dy

## 2018-10-10 NOTE — ED Notes (Signed)
Breakfast Tray ordered  

## 2018-10-10 NOTE — Care Management CC44 (Signed)
Condition Code 44 Documentation Completed  Patient Details  Name: Tom [name] MRN: [id number] Date of Birth: 12/04/1950   Condition Code 44 given:  Yes Patient signature on Condition Code 44 notice:  Yes Documentation of 2 MD's agreement:  Yes Code 44 added to claim:  Yes    Fuller Mandril, RN 10/10/2018, 4:00 PM

## 2018-10-10 NOTE — Progress Notes (Signed)
  Echocardiogram 2D Echocardiogram has been performed.  Darlina Sicilian M 10/10/2018, 8:10 AM

## 2018-10-10 NOTE — Care Management Obs Status (Signed)
[place] NOTIFICATION   Patient Details  Name: Tom [name] MRN: [id number] Date of Birth: Nov 22, 1950   Medicare Observation Status Notification Given:  Yes    Fuller Mandril, RN 10/10/2018, 4:01 PM

## 2018-10-10 NOTE — ED Notes (Signed)
Patient informed of need for urine specimen 

## 2018-10-10 NOTE — H&P (Signed)
History and Physical    Tom [name] ERX:[id number] DOB: 1951-06-18 DOA: 10/09/2018  PCP: Asencion Noble, MD Patient coming from: Home  Chief Complaint: Chest pain, syncope  HPI: Tom [name] is a 68 y.o. male with medical history significant of CAD status post CABG, hypertension, hyperlipidemia, paroxysmal atrial fibrillation, GERD presenting to the hospital for evaluation of chest pain and syncope.  Patient states at 5:30 PM today (2/19) he had dinner and experiences severe acid reflux afterwards.  An hour later, he started having substernal achy, nonradiating chest pain which he thought was due to his acid reflux.  No associated dyspnea, dizziness, or nausea.  His chest pain continued to worsen and at 8:30 PM he took 3 sublingual nitroglycerin tablets at the same time without waiting 5 minutes in between each dose.  Wife at bedside states patient lost consciousness for 30 seconds after taking nitroglycerin and appeared pale and clammy at that time.  He was in a recliner at that time and did not fall or sustain any injuries.  Patient denies taking Viagra or any other similar medication at home.  States after reaching the ED, Maalox and morphine helped relieve his chest pain.  He smokes 10 cigarettes/day and has been smoking since age of 22.  Review of Systems: As per HPI otherwise 10 point review of systems negative.  Past Medical History:  Diagnosis Date  . AAA (abdominal aortic aneurysm) ([place])   . Anxiety   . Cancer [facility]) 2013   skin cancer,   MOHS surgery  . Coronary artery disease   . Dental bridge present    upper  . Dental crowns present   . Family history of adverse reaction to anesthesia    pt's mother has hx. of post-op N/V  . Foreign body of finger of left hand 12/2014   index finger  . GERD (gastroesophageal reflux disease)   . Hypercholesteremia   . Hypertension    states under control with med., has been on med. x 20 yr.  . Myocardial infarction ([place]) 1997  . PAF (paroxysmal  atrial fibrillation) ([place])   . Wears glasses     Past Surgical History:  Procedure Laterality Date  . ABDOMINAL AORTIC ANEURYSM REPAIR  06/12/2012  . CARDIAC CATHETERIZATION  10/03/2005  . COLONOSCOPY    . CORONARY ARTERY BYPASS GRAFT  10/16/2005   LIMA to LAD saphenous vein to RCA  . FOREIGN BODY REMOVAL Left 01/08/2015   Procedure: EXCISION FOREIGN BODY ;  Surgeon: Daryll Brod, MD;  Location: [place];  Service: Orthopedics;  Laterality: Left;  ANESTHESIA:  GENERAL, IV REGIONAL UPPER ARM  . I&D EXTREMITY Left 11/12/2014   Procedure: IRRIGATION AND DEBRIDEMENT LEFT INDEX FINGER;  Surgeon: Daryll Brod, MD;  Location: [place];  Service: Orthopedics;  Laterality: Left;  . INCISION AND DRAINAGE ABSCESS Left 12/22/2014   Procedure: INCISION AND DRAINAGE ABSCESS LEFT INDEX FINGER;  Surgeon: Daryll Brod, MD;  Location: [place];  Service: Orthopedics;  Laterality: Left;  . PERCUTANEOUS STENT INTERVENTION Left 08/09/2012   Procedure: PERCUTANEOUS STENT INTERVENTION;  Surgeon: Elam Dutch, MD;  Location: [facility] CATH LAB;  Service: Cardiovascular;  Laterality: Left;  stent to lt renal artery  . RENAL ANGIOGRAM N/A 08/09/2012   Procedure: RENAL ANGIOGRAM;  Surgeon: Elam Dutch, MD;  Location: [facility] CATH LAB;  Service: Cardiovascular;  Laterality: N/A;     reports that he has been smoking cigarettes. He has a 22.00 pack-year smoking history.  He has never used smokeless tobacco. He reports that he does not drink alcohol or use drugs.  Allergies  Allergen Reactions  . Niacin And Related Hives  . Codeine Other (See Comments)    GI upset ONLY in the cough syrup   . Niacin Hives    Family History  Problem Relation Age of Onset  . Heart disease Father        before age 35  . Hypertension Father   . Hyperlipidemia Father   . Diabetes Father   . Heart attack Father   . Heart disease Mother   . Hypertension Mother   . Hyperlipidemia Mother   .  Anesthesia problems Mother        hx. of post-op N/V  . AAA (abdominal aortic aneurysm) Maternal Uncle   . Cancer Maternal Uncle   . Hyperlipidemia Sister     Prior to Admission medications   Medication Sig Start Date End Date Taking? Authorizing Provider  alprazolam Duanne Moron) 2 MG tablet Take 2 mg by mouth at bedtime.   Yes [provider]  apixaban (ELIQUIS) 5 MG TABS tablet Take 1 tablet (5 mg total) by mouth 2 (two) times daily. 06/20/18  Yes Nahser, Wonda Cheng, MD  aspirin 81 MG tablet Take 1 tablet (81 mg total) by mouth daily. 07/02/13  Yes Nahser, Wonda Cheng, MD  atorvastatin (LIPITOR) 40 MG tablet Take 1 tablet (40 mg total) by mouth daily. 06/13/16  Yes Nahser, Wonda Cheng, MD  chlorthalidone (HYGROTON) 25 MG tablet Take 25 mg by mouth daily.  05/06/18  Yes [provider]  dronedarone (MULTAQ) 400 MG tablet Take 400 mg by mouth daily.   Yes [provider]  metoprolol succinate (TOPROL-XL) 50 MG 24 hr tablet TAKE 1 TABLET EVERY DAY  WITH  OR  IMMEDIATELY  FOLLOWING A MEAL Patient taking differently: Take 50 mg by mouth daily.  07/01/18  Yes Nahser, Wonda Cheng, MD  olmesartan (BENICAR) 20 MG tablet Take 1 tablet by mouth daily. 05/07/18  Yes [provider]  pantoprazole (PROTONIX) 40 MG tablet Take 40 mg by mouth daily.     Yes [provider]    Physical Exam: Vitals:   10/09/18 2330 10/10/18 0015 10/10/18 0045 10/10/18 0100  BP: 138/75 (!) 159/78 (!) 170/81 (!) 135/98  Pulse: (!) 56 (!) 58 (!) 52 (!) 55  Resp: 16 13 16 14   Temp:      TempSrc:      SpO2: 97% 96% 96% 95%    Physical Exam  Constitutional: He is oriented to person, place, and time. He appears well-developed and well-nourished. No distress.  HENT:  Head: Normocephalic.  Mouth/Throat: Oropharynx is clear and moist.  Eyes: Right eye exhibits no discharge. Left eye exhibits no discharge.  Neck: Neck supple.  Cardiovascular: Normal rate, regular rhythm and intact distal  pulses.  Pulmonary/Chest: Effort normal and breath sounds normal. No respiratory distress. He has no wheezes. He has no rales.  Abdominal: Soft. Bowel sounds are normal. He exhibits no distension. There is no abdominal tenderness. There is no guarding.  Musculoskeletal:        General: No edema.  Neurological: He is alert and oriented to person, place, and time.  Skin: Skin is warm and dry. He is not diaphoretic.  Psychiatric: He has a normal mood and affect. His behavior is normal.     Labs on Admission: I have personally reviewed following labs and imaging studies  CBC: Recent Labs  Lab  10/09/18 2210  WBC 10.2  NEUTROABS 6.1  HGB 13.6  HCT 39.6  MCV 86.3  PLT 371   Basic Metabolic Panel: Recent Labs  Lab 10/09/18 2210 10/10/18 0033 10/10/18 0429  NA 123* 122* 121*  K 4.0 4.6 4.0  CL 85* 86* 84*  CO2 25 25 26   GLUCOSE 96 100* 122*  BUN 25* 26* 24*  CREATININE 1.53* 1.42* 1.57*  CALCIUM 8.8* 8.8* 8.6*   GFR: CrCl cannot be calculated (Unknown ideal weight.). Liver Function Tests: Recent Labs  Lab 10/09/18 2210  AST 28  ALT 29  ALKPHOS 59  BILITOT 0.7  PROT 6.4*  ALBUMIN 3.7   Recent Labs  Lab 10/09/18 2210  LIPASE 56*   No results for input(s): AMMONIA in the last 168 hours. Coagulation Profile: No results for input(s): INR, PROTIME in the last 168 hours. Cardiac Enzymes: Recent Labs  Lab 10/09/18 2210 10/10/18 0033  TROPONINI <0.03 <0.03   BNP (last 3 results) No results for input(s): PROBNP in the last 8760 hours. HbA1C: No results for input(s): HGBA1C in the last 72 hours. CBG: No results for input(s): GLUCAP in the last 168 hours. Lipid Profile: No results for input(s): CHOL, HDL, LDLCALC, TRIG, CHOLHDL, LDLDIRECT in the last 72 hours. Thyroid Function Tests: No results for input(s): TSH, T4TOTAL, FREET4, T3FREE, THYROIDAB in the last 72 hours. Anemia Panel: No results for input(s): VITAMINB12, FOLATE, FERRITIN, TIBC, IRON, RETICCTPCT  in the last 72 hours. Urine analysis:    Component Value Date/Time   COLORURINE YELLOW 06/11/2012 [place] 06/11/2012 0946   LABSPEC 1.005 06/11/2012 0946   PHURINE 6.0 06/11/2012 0946   GLUCOSEU NEGATIVE 06/11/2012 0946   HGBUR NEGATIVE 06/11/2012 0946   BILIRUBINUR NEGATIVE 06/11/2012 0946   KETONESUR NEGATIVE 06/11/2012 0946   PROTEINUR NEGATIVE 06/11/2012 0946   UROBILINOGEN 0.2 06/11/2012 0946   NITRITE NEGATIVE 06/11/2012 0946   LEUKOCYTESUR NEGATIVE 06/11/2012 0946    Radiological Exams on Admission: Dg Chest Portable 1 View  Result Date: 10/09/2018 CLINICAL DATA:  Severe mid chest pain onset in onset this evening dyspnea. EXAM: PORTABLE CHEST 1 VIEW COMPARISON:  01/05/2017 FINDINGS: The patient is status post CABG. The heart size and mediastinal contours are within normal limits. Aortic atherosclerosis is noted at the arch without aneurysm. Mild emphysematous change of lungs without alveolar consolidation or CHF. No effusion or pneumothorax. The visualized skeletal structures are unremarkable. IMPRESSION: No active disease.  Mild pulmonary hyperinflation. Electronically Signed   By: Ashley Royalty M.D.   On: 10/09/2018 23:02    EKG: Independently reviewed.  Sinus bradycardia (heart rate 52).  No acute ischemic changes.  Assessment/Plan Principal Problem:   Chest pain Active Problems:   Tobacco abuse   Hypercholesterolemia   Syncope   Hyponatremia   Chest pain -History of CAD status post CABG. -Patient briefly lost consciousness after taking 3 sublingual nitroglycerin tablets at home in rapid succession.  Systolic blood pressure 90 by EMS.  He received a 250 cc fluid bolus and subsequent blood pressure readings normal. -Troponin negative.  EKG not suggestive of ACS.  Chest x-ray without evidence of active disease.  Patient is currently chest pain-free. -Patient was seen by cardiology.  Plan is stress test if cardiac enzymes negative. -Cardiac  monitoring -Aspirin 324 mg once, then continue aspirin 81 mg daily starting in the morning -Continue high intensity statin -Continue to trend troponin -Echocardiogram -Avoid nitroglycerin -IV morphine PRN chest pain  Syncope -Likely related to 3 doses of  sublingual nitroglycerin in rapid succession.  Heart rate in the 50s, previously as well.  Patient is on a beta-blocker at home. -Continue to monitor on telemetry  Hyponatremia Sodium 123.  Likely related to home thiazide diuretic use. -BMP every 4 hours -Avoid rapid correction of sodium -Check serum osmolarity  CKD 3 -Stable.  Creatinine 1.5, at baseline.  Paroxysmal atrial fibrillation -Hold home Eliquis while ruling out ACS.  Hypertension -Hold home antihypertensives at this time  Hyperlipidemia -Continue statin  GERD -Continue PPI -GI cocktail PRN  Tobacco use Patient is currently smoking 1/2 pack/day.  I discussed smoking cessation but he appears to be in the pre-contemplative stage at this time.  He declined nicotine patch in the hospital.  Anxiety -Continue home Xanax  DVT prophylaxis: Subcutaneous heparin Code Status: Patient wishes to be full code. Family Communication: Wife at bedside. Disposition Plan: Anticipate discharge in 1 to 2 days. Consults called: Cardiology  admission status: Observation, cardiac telemetry   Shela Leff MD [facility] Pager [phone]  If 7PM-7AM, please contact night-coverage [email or web address] Password [email or web address]  10/10/2018, 5:49 AM

## 2018-10-10 NOTE — ED Notes (Signed)
Patient verbalizes understanding of discharge instructions. Opportunity for questioning and answers were provided. Armband removed by staff, pt discharged from ED.  

## 2018-10-11 NOTE — Discharge Summary (Signed)
Tom [name], is a 68 y.o. male  DOB Sep 26, 1950  MRN [id number].  Admission date:  10/09/2018  Admitting Physician  No admitting provider for patient encounter.  Discharge Date:  10/11/2018   Primary MD  Asencion Noble, MD  Recommendations for primary care physician for things to follow:   Follow-up sodium level  Discharge Diagnosis    Principal Problem:   Chest pain Active Problems:   CAD (coronary artery disease)   Tobacco abuse   Hypercholesterolemia   AAA (abdominal aortic aneurysm) (HCC)   Syncope   Hyponatremia      Past Medical History:  Diagnosis Date  . AAA (abdominal aortic aneurysm) ([place])   . Anxiety   . Cancer [facility]) 2013   skin cancer,   MOHS surgery  . Coronary artery disease   . Dental bridge present    upper  . Dental crowns present   . Family history of adverse reaction to anesthesia    pt's mother has hx. of post-op N/V  . Foreign body of finger of left hand 12/2014   index finger  . GERD (gastroesophageal reflux disease)   . Hypercholesteremia   . Hypertension    states under control with med., has been on med. x 20 yr.  . Myocardial infarction ([place]) 1997  . PAF (paroxysmal atrial fibrillation) ([place])   . Wears glasses     Past Surgical History:  Procedure Laterality Date  . ABDOMINAL AORTIC ANEURYSM REPAIR  06/12/2012  . CARDIAC CATHETERIZATION  10/03/2005  . COLONOSCOPY    . CORONARY ARTERY BYPASS GRAFT  10/16/2005   LIMA to LAD saphenous vein to RCA  . FOREIGN BODY REMOVAL Left 01/08/2015   Procedure: EXCISION FOREIGN BODY ;  Surgeon: Daryll Brod, MD;  Location: [place];  Service: Orthopedics;  Laterality: Left;  ANESTHESIA:  GENERAL, IV REGIONAL UPPER ARM  . I&D EXTREMITY Left 11/12/2014   Procedure: IRRIGATION AND DEBRIDEMENT LEFT INDEX FINGER;  Surgeon: Daryll Brod, MD;   Location: [place];  Service: Orthopedics;  Laterality: Left;  . INCISION AND DRAINAGE ABSCESS Left 12/22/2014   Procedure: INCISION AND DRAINAGE ABSCESS LEFT INDEX FINGER;  Surgeon: Daryll Brod, MD;  Location: [place];  Service: Orthopedics;  Laterality: Left;  . PERCUTANEOUS STENT INTERVENTION Left 08/09/2012   Procedure: PERCUTANEOUS STENT INTERVENTION;  Surgeon: Elam Dutch, MD;  Location: [facility] CATH LAB;  Service: Cardiovascular;  Laterality: Left;  stent to lt renal artery  . RENAL ANGIOGRAM N/A 08/09/2012   Procedure: RENAL ANGIOGRAM;  Surgeon: Elam Dutch, MD;  Location: [facility] CATH LAB;  Service: Cardiovascular;  Laterality: N/A;       HPI  from the history and physical done on the day of admission:    CAULIN BEGLEY is a 68 y.o. male with medical history significant of CAD status post CABG, hypertension, hyperlipidemia, paroxysmal atrial fibrillation, GERD presenting to the hospital for evaluation of chest pain and syncope.  Patient states at 5:30 PM today (2/19) he had  dinner and experiences severe acid reflux afterwards.  An hour later, he started having substernal achy, nonradiating chest pain which he thought was due to his acid reflux.  No associated dyspnea, dizziness, or nausea.  His chest pain continued to worsen and at 8:30 PM he took 3 sublingual nitroglycerin tablets at the same time without waiting 5 minutes in between each dose.  Wife at bedside states patient lost consciousness for 30 seconds after taking nitroglycerin and appeared pale and clammy at that time.  He was in a recliner at that time and did not fall or sustain any injuries.  Patient denies taking Viagra or any other similar medication at home.  States after reaching the ED, Maalox and morphine helped relieve his chest pain.  He smokes 10 cigarettes/day and has been smoking since age of 49.      Hospital Course:   1.  Chest pain, history of coronary artery disease s/p CABG:  Noted acute onset of sternal chest pain that he initially thought was likely related to indigestion.  However, symptoms became significantly severe for which he took 3 nitroglycerin quicker than directed, and subsequently passed out.  Symptoms resolved since morphine, aspirin, and Maalox.  Troponins negative and EKG was unchanged.  Echocardiogram noted EF of 60-65% without significant wall motion abnormalities noted.  Cardiology recommending outpatient follow-up with his cardiologist Dr. Cathie Olden in the next week.  2.  Hyponatremia: Acute.  Sodium level 123->122->121->124.  Patient reports history of swelling for which he had been prescribed chlorthalidone to take as needed.  However, sounds as though he had been taking this medication quite regularly.  Has been on normal saline IV fluids at 150 mL/h until about noon.  Last checked at 7 AM. Patient reports close follow-up and will go to primary care office to have blood work drawn in a.m.  Review of records shows patient has had intermittently low sodiums, previously but normally in the 30s.  Advised patient to stop chlorthalidone until able to follow-up with his primary care provider as this medication is likely causing his sodium levels to be significantly lower.  Patient was assessed and noted to be in his normal state of being without signs of lethargy or altered.  Discussed in detail symptoms for return to the hospital, and advised patient of the possibility of his primary care provider requesting that he return to the hospital if sodium levels were not improving.  3.  Syncope: Likely secondary to rapid cessation of nitroglycerin.  Heart rates were noted in the 50s but patient on beta-blocker at home.  Telemetry noted bradycardia.  4.  Essential hypertension: Patient's blood pressure medications are initially held due to presenting symptoms.  At discharge, patient was instructed to stop chlorthalidone and resume other medications as tolerated.  5.   Chronic kidney disease stage III: His creatinine appeared to be near his baseline around 1.4-1.5.  6.  Paroxysmal atrial fibrillation: Heart rate is controlled at this time.  Eliquis was placed on hold initially while he was being evaluating for ACS, but was restarted at discharge.  7.  Hyperlipidemia: Continued on statin  8.  GERD: Continued Protonix  9.  Tobacco use: He currently smokes half a pack of cigarettes per day on average and declined nicotine patch.  10.  Anxiety: Xanax at bedtime  11.  History of AAA  Follow UP     Consults obtained [facility] Cardiology  Discharge Condition: Stable.  Diet and Activity recommendation: See Discharge Instructions below   Discharge  Instructions    Discharge instructions   Complete by:  As directed    Please call primary doctor office in the morning and get labwork of a BMP checked within 1-2 days. Cardiology will call you with follow-up appointment for further testing.  Do not take chlorthalidone until able to follow-up with your primary care provider.  Be advised if you develop confusion or increased lethargy you should come back to the hospital for further evaluation.  Your primary care provider may warrant that she come back to the hospital if sodium level still remains low upon recheck.        Discharge Medications     Allergies as of 10/10/2018      Reactions   Niacin And Related Hives   Codeine Other (See Comments)   GI upset ONLY in the cough syrup    Niacin Hives      Medication List    STOP taking these medications   chlorthalidone 25 MG tablet Commonly known as:  HYGROTON     TAKE these medications   alprazolam 2 MG tablet Commonly known as:  XANAX Take 2 mg by mouth at bedtime.   apixaban 5 MG Tabs tablet Commonly known as:  ELIQUIS Take 1 tablet (5 mg total) by mouth 2 (two) times daily.   aspirin 81 MG tablet Take 1 tablet (81 mg total) by mouth daily.   atorvastatin 40 MG tablet Commonly known as:   LIPITOR Take 1 tablet (40 mg total) by mouth daily.   dronedarone 400 MG tablet Commonly known as:  MULTAQ Take 400 mg by mouth daily.   metoprolol succinate 50 MG 24 hr tablet Commonly known as:  TOPROL-XL TAKE 1 TABLET EVERY DAY  WITH  OR  IMMEDIATELY  FOLLOWING A MEAL What changed:  See the new instructions.   olmesartan 20 MG tablet Commonly known as:  BENICAR Take 1 tablet by mouth daily.   pantoprazole 40 MG tablet Commonly known as:  PROTONIX Take 40 mg by mouth daily.       Major procedures and Radiology Reports - PLEASE review detailed and final reports for all details, in brief -   Echo 10/10/18: 1. The left ventricle has normal systolic function with an ejection fraction of 60-65%. The cavity size was normal. There is mild asymmetric left ventricular hypertrophy of the basal anteroseptal wall. Left ventricular diastolic Doppler parameters are  consistent with impaired relaxation No evidence of left ventricular regional wall motion abnormalities. 2. The right ventricle has normal systolic function. The cavity was normal. There is no increase in right ventricular wall thickness. 3. The mitral valve is degenerative. Mild thickening of the mitral valve leaflet. There is moderate mitral annular calcification present. 4. The tricuspid valve is normal in structure. 5. The aortic valve is tricuspid Mild thickening of the aortic valve Moderate calcification of the aortic valve. 6. The pulmonic valve was normal in structure. 7. No evidence of left ventricular regional wall motion abnormalities.  Dg Chest Portable 1 View  Result Date: 10/09/2018 CLINICAL DATA:  Severe mid chest pain onset in onset this evening dyspnea. EXAM: PORTABLE CHEST 1 VIEW COMPARISON:  01/05/2017 FINDINGS: The patient is status post CABG. The heart size and mediastinal contours are within normal limits. Aortic atherosclerosis is noted at the arch without aneurysm. Mild emphysematous change of lungs  without alveolar consolidation or CHF. No effusion or pneumothorax. The visualized skeletal structures are unremarkable. IMPRESSION: No active disease.  Mild pulmonary hyperinflation. Electronically Signed  By: Ashley Royalty M.D.   On: 10/09/2018 23:02    Micro Results   No results found for this or any previous visit (from the past 240 hour(s)).     Today   Subjective    Bryan Goin states that he initially thought symptoms were indigestion, but became very severe with some numbness in his toes.  He took 3 nitroglycerin tablets too quickly causing him to pass out.   Objective   Blood pressure (!) 151/70, pulse 60, temperature 98.1 F (36.7 C), temperature source Oral, resp. rate 15, SpO2 100 %.  No intake or output data in the 24 hours ending 10/11/18 0631  Exam  Constitutional: NAD, calm, comfortable Eyes: PERRL, lids and conjunctivae normal ENMT: Mucous membranes are moist. Posterior pharynx clear of any exudate or lesions.Normal dentition.  Neck: normal, supple, no masses, no thyromegaly Respiratory: clear to auscultation bilaterally, no wheezing, no crackles. Normal respiratory effort. No accessory muscle use.  Cardiovascular: Regular rate and rhythm, no murmurs / rubs / gallops. No extremity edema. 2+ pedal pulses. No carotid bruits.  Abdomen: no tenderness, no masses palpated. No hepatosplenomegaly. Bowel sounds positive.  Musculoskeletal: no clubbing / cyanosis. No joint deformity upper and lower extremities. Good ROM, no contractures. Normal muscle tone.  Skin: no rashes, lesions, ulcers. No induration Neurologic: CN 2-12 grossly intact. Sensation intact, DTR normal. Strength 5/5 in all 4.  Psychiatric: Normal judgment and insight. Alert and oriented x 3. Normal mood.    Data Review   CBC w Diff:  Lab Results  Component Value Date   WBC 10.2 10/09/2018   HGB 13.6 10/09/2018   HGB 14.5 06/20/2018   HCT 39.6 10/09/2018   HCT 41.7 06/20/2018   PLT 300 10/09/2018    PLT 412 06/20/2018   LYMPHOPCT 26 10/09/2018   MONOPCT 10 10/09/2018   EOSPCT 2 10/09/2018   BASOPCT 1 10/09/2018    CMP:  Lab Results  Component Value Date   NA 124 (L) 10/10/2018   NA 130 (L) 06/20/2018   K 4.0 10/10/2018   CL 88 (L) 10/10/2018   CO2 26 10/10/2018   BUN 24 (H) 10/10/2018   BUN 17 06/20/2018   CREATININE 1.41 (H) 10/10/2018   CREATININE 1.42 (H) 06/12/2016   PROT 6.4 (L) 10/09/2018   PROT 6.7 06/20/2018   ALBUMIN 3.7 10/09/2018   ALBUMIN 4.4 06/20/2018   BILITOT 0.7 10/09/2018   BILITOT 0.4 06/20/2018   ALKPHOS 59 10/09/2018   AST 28 10/09/2018   ALT 29 10/09/2018  .   Total Time in preparing paper work, data evaluation and todays exam - 35 minutes  Norval Morton M.D on 10/11/2018 at 6:31 AM  [facility]   Office  [phone]

## 2018-10-23 ENCOUNTER — Encounter: Payer: Self-pay | Admitting: Internal Medicine

## 2018-11-07 ENCOUNTER — Ambulatory Visit: Payer: Medicare Other | Admitting: Internal Medicine

## 2018-12-20 DIAGNOSIS — D72829 Elevated white blood cell count, unspecified: Secondary | ICD-10-CM | POA: Diagnosis not present

## 2018-12-27 DIAGNOSIS — I25119 Atherosclerotic heart disease of native coronary artery with unspecified angina pectoris: Secondary | ICD-10-CM | POA: Diagnosis not present

## 2018-12-27 DIAGNOSIS — E871 Hypo-osmolality and hyponatremia: Secondary | ICD-10-CM | POA: Diagnosis not present

## 2018-12-27 DIAGNOSIS — N183 Chronic kidney disease, stage 3 (moderate): Secondary | ICD-10-CM | POA: Diagnosis not present

## 2018-12-27 DIAGNOSIS — I480 Paroxysmal atrial fibrillation: Secondary | ICD-10-CM | POA: Diagnosis not present

## 2019-03-20 DIAGNOSIS — E871 Hypo-osmolality and hyponatremia: Secondary | ICD-10-CM | POA: Diagnosis not present

## 2019-03-20 DIAGNOSIS — K219 Gastro-esophageal reflux disease without esophagitis: Secondary | ICD-10-CM | POA: Diagnosis not present

## 2019-03-21 DIAGNOSIS — N183 Chronic kidney disease, stage 3 (moderate): Secondary | ICD-10-CM | POA: Diagnosis not present

## 2019-03-21 DIAGNOSIS — E871 Hypo-osmolality and hyponatremia: Secondary | ICD-10-CM | POA: Diagnosis not present

## 2019-03-21 DIAGNOSIS — I2510 Atherosclerotic heart disease of native coronary artery without angina pectoris: Secondary | ICD-10-CM | POA: Diagnosis not present

## 2019-03-21 DIAGNOSIS — Z79899 Other long term (current) drug therapy: Secondary | ICD-10-CM | POA: Diagnosis not present

## 2019-03-21 DIAGNOSIS — I480 Paroxysmal atrial fibrillation: Secondary | ICD-10-CM | POA: Diagnosis not present

## 2019-03-27 ENCOUNTER — Telehealth: Payer: Self-pay | Admitting: *Deleted

## 2019-03-27 MED ORDER — APIXABAN 5 MG PO TABS: 5.0000 mg | ORAL_TABLET | Freq: Two times a day (BID) | ORAL | 1 refills | Status: DC

## 2019-03-27 NOTE — Telephone Encounter (Signed)
Xarelto 20mg  refill request received; pt is 69 yrs old, wt-76.7kg, Crea-1.41 on 10/10/2018, last seen by Dr. Lovena Le on 07/12/2018, Diagnosis-Afib, CrCl-55.50ml/min; will send in refill.

## 2019-04-01 ENCOUNTER — Telehealth: Payer: Self-pay | Admitting: Pharmacist

## 2019-04-01 MED ORDER — RIVAROXABAN 20 MG PO TABS: 20.0000 mg | ORAL_TABLET | Freq: Every day | ORAL | 1 refills | Status: DC

## 2019-04-01 NOTE — Telephone Encounter (Signed)
Received request from [facility] for Rx for Xarelto. Patient currently on Eliquis (was on Xarelto in past). Called patient to clarify. Per patient, Eliquis is no longer covered on plan. Wishing to switch back to Xarelto. crcl is 10ml/min Will send rx to [facility] for Xarelto 20mg  with dinner. Patient made aware to start Xarelto when he would be due for his Eliquis evening dose.

## 2019-05-14 ENCOUNTER — Other Ambulatory Visit: Payer: Self-pay | Admitting: Cardiovascular Disease

## 2019-05-14 MED ORDER — DRONEDARONE HCL 400 MG PO TABS: 400.0000 mg | ORAL_TABLET | Freq: Every day | ORAL | 0 refills | Status: DC

## 2019-05-14 NOTE — Telephone Encounter (Signed)
Pt's medication was sent to pt's pharmacy as requested. Confirmation received.  °

## 2019-05-15 MED ORDER — DRONEDARONE HCL 400 MG PO TABS: 400.0000 mg | ORAL_TABLET | Freq: Every day | ORAL | 0 refills | Status: DC

## 2019-05-15 NOTE — Addendum Note (Signed)
Addended by: Derl Barrow on: 05/15/2019 08:49 AM   Modules accepted: Orders

## 2019-05-22 DIAGNOSIS — Z23 Encounter for immunization: Secondary | ICD-10-CM | POA: Diagnosis not present

## 2019-05-29 DIAGNOSIS — D3131 Benign neoplasm of right choroid: Secondary | ICD-10-CM | POA: Diagnosis not present

## 2019-05-29 DIAGNOSIS — H2513 Age-related nuclear cataract, bilateral: Secondary | ICD-10-CM | POA: Diagnosis not present

## 2019-06-03 ENCOUNTER — Telehealth: Payer: Self-pay | Admitting: Cardiovascular Disease

## 2019-06-03 NOTE — Telephone Encounter (Signed)
New message:     Patient returning call back. Please call patient back.

## 2019-06-03 NOTE — Telephone Encounter (Signed)
° ° °  1. What dental office are you calling from? [facility]   2. What is your office phone number? [phone]  3. What is your fax number? [phone]  4. What type of procedure is the patient having performed? 1 extraction  5. What date is procedure scheduled or is the patient there now? 10/15 (if the patient is at the dentist's office question goes to their cardiologist if he/she is in the office.  If not, question should go to the DOD).   6. What is your question (ex. Antibiotics prior to procedure, holding medication-we need to know how long dentist wants pt to hold med)? Should any medication be held

## 2019-06-03 NOTE — Telephone Encounter (Signed)
Agree, do not need to hold anticoagulation for a single dental extraction. Pt does not require pre op abx either.

## 2019-06-03 NOTE — Telephone Encounter (Signed)
In chart review, it appears this patient was previously on Eliquis and has most recently been switched to [place] 03/2019.  Called the patient to attempt clarification today however there was no answer.  Awaiting callback for clarification.  Given only 1 tooth extraction, no need to hold anticoagulation.  Patient has upcoming appointment with Dr. Acie Fredrickson on 06/23/2019

## 2019-06-22 NOTE — Progress Notes (Deleted)
Tom [name] Date of Birth  03/23/1951 Tom [name] 68 [address]    [place], [place]  [address] [phone]  Fax  [phone]  Problem list: 1. Coronary artery disease-status post CABG. February 2007. He had a LIMA  to the LAD and a SVG to the right coronary artery. 2. Hypertension 3. Hyperlipidemia 4. Renal insufficiency 5. Peripheral Vascular disease:  S/p AAA stent graft repair ( Oct. 2013)  s/p renal artery stenting  ( Dec. 2013) 6. Paroxysmal atrial fibrillation:      68 y.o. year-old gentleman with a history of coronary disease. He status post coronary artery bypass grafting in February 2007. He had a left internal mammary artery to the LAD and a saphenous vein graft to the right coronary artery. He also has a history of hypercholesterolemia, hypertension, and ongoing cigarette smoking. He's not had any episodes of chest pain, shortness breath, syncope, or presyncope.  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 He does not do any regular aerobic exercise. He's not had any recurrent episodes of chest pain. He tries to watch his salt intake.  He still smokes about a half a pack of cigarettes a day but has cut back from 2 packs a day. 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  November 22, 2012:  He has had an AAA stent graft repair and renal artery stenting since I last saw him.  He has had several episodes of chest pressure / tightness.  He found his BP and Hr  to be elevated.  He took and extra Toprol and ASA and felt better within an hour.  Oct. 24, 2014:  Tom [name] is having CP and palpitations .  Some relief with Inderal but occasionally has to take some NTG.  Symptoms seen to occur more at night - not during the day while he is working.  stil smoking 1/2 ppd.  Nov. 21, 2014:  Tom [name] is doing ok.  He was diagnosed with atrial fib by event monitor.  He has had several episodes of PAF - resolved after several  propranolol  Jan 02, 2014:  He is doing well.  Still have PAF - probably once a month.  Will last 4-6 hours.   He does not drink ETOH.   still smoking some .  Works as a Curator.   Oct. 24, 2016:  No CP ,  No recent episodes of PAF since starting the Multaq.  Was started by Dr. Lovena Le in Oct. 2015 Has not been taking his amlodipine due to leg swelling . Doing well.   BP is typcially ok  Getting some exercise Yard work  Retired from painting    Oct. 24, 2017:  Tom [name] is doing well.  Thinks he forgot his BP meds today .  Typical BP is well controlled.  Hs PAF - was started on Multaq.   Oct. 25, 2018:  Has lost 25 lbs. - purposeful weight loss Started cutting back on his BP meds Stopped Valsartan when it was recalled.  Has had orthostasis - once while using a chain saw. Several episodes of orthostasis getting up to the BR at night .   No CP , no dyspnea No syncope since adjusting his meds.  Cut his toprol XL to 50  Changed valsartan to Losartan  Has not had any a-fib since being on Multaq  Oct. 31, 2019:  Tom [name] is seen today for follow up of his CAD,  HTN,  HLD   and PAF .   Wt. Is 168-  Had an episode of leg swelling on 1 occasion. Is on Multaq - his primary MD gave him some HCTZ to take on occasion .  Seems to be related to being on his feet.  Takes the HCTZ maybe 1-2 times a week .  Thinks he may be able to get eliquis cheaper than Xarelto .  Has been taking the Multaq once a day due to cost  Encouraged him to see Dr. Lovena Le again if he wants to switch .   Nov. 2, 2020   Current Outpatient Medications on File Prior to Visit  Medication Sig Dispense Refill  . alprazolam (XANAX) 2 MG tablet Take 2 mg by mouth at bedtime.    [name] Kitchen apixaban (ELIQUIS) 5 MG TABS tablet Take 1 tablet (5 mg total) by mouth 2 (two) times daily. 180 tablet 1  . aspirin 81 MG tablet Take 1 tablet (81 mg total) by mouth daily.    [name] Kitchen atorvastatin (LIPITOR) 40 MG tablet Take 1 tablet (40 mg  total) by mouth daily. 90 tablet 3  . dronedarone (MULTAQ) 400 MG tablet Take 1 tablet (400 mg total) by mouth daily. Please keep upcoming appt in November with Dr. Acie Fredrickson for future refills. Thank you 90 tablet 0  . metoprolol succinate (TOPROL-XL) 50 MG 24 hr tablet TAKE 1 TABLET EVERY DAY  WITH  OR  IMMEDIATELY  FOLLOWING A MEAL 90 tablet 0  . olmesartan (BENICAR) 20 MG tablet Take 1 tablet by mouth daily.    . pantoprazole (PROTONIX) 40 MG tablet Take 40 mg by mouth daily.      . rivaroxaban (XARELTO) 20 MG TABS tablet Take 1 tablet (20 mg total) by mouth daily with supper. 90 tablet 1   No current facility-administered medications on file prior to visit.     Allergies  Allergen Reactions  . Niacin And Related Hives  . Codeine Other (See Comments)    GI upset ONLY in the cough syrup   . Niacin Hives    Past Medical History:  Diagnosis Date  . AAA (abdominal aortic aneurysm) ([place])   . Anxiety   . Cancer [facility]) 2013   skin cancer,   MOHS surgery  . Coronary artery disease   . Dental bridge present    upper  . Dental crowns present   . Family history of adverse reaction to anesthesia    pt's mother has hx. of post-op N/V  . Foreign body of finger of left hand 12/2014   index finger  . GERD (gastroesophageal reflux disease)   . Hypercholesteremia   . Hypertension    states under control with med., has been on med. x 20 yr.  . Myocardial infarction ([place]) 1997  . PAF (paroxysmal atrial fibrillation) ([place])   . Wears glasses     Past Surgical History:  Procedure Laterality Date  . ABDOMINAL AORTIC ANEURYSM REPAIR  06/12/2012  . CARDIAC CATHETERIZATION  10/03/2005  . COLONOSCOPY    . CORONARY ARTERY BYPASS GRAFT  10/16/2005   LIMA to LAD saphenous vein to RCA  . FOREIGN BODY REMOVAL Left 01/08/2015   Procedure: EXCISION FOREIGN BODY ;  Surgeon: Daryll Brod, MD;  Location: [place];  Service: Orthopedics;  Laterality: Left;  ANESTHESIA:  GENERAL, IV REGIONAL  UPPER ARM  . I&D EXTREMITY Left 11/12/2014   Procedure: IRRIGATION AND DEBRIDEMENT LEFT INDEX FINGER;  Surgeon: Daryll Brod, MD;  Location: [facility];  Service: Orthopedics;  Laterality: Left;  . INCISION AND DRAINAGE ABSCESS Left 12/22/2014   Procedure: INCISION AND DRAINAGE ABSCESS LEFT INDEX FINGER;  Surgeon: Daryll Brod, MD;  Location: [place];  Service: Orthopedics;  Laterality: Left;  . PERCUTANEOUS STENT INTERVENTION Left 08/09/2012   Procedure: PERCUTANEOUS STENT INTERVENTION;  Surgeon: Elam Dutch, MD;  Location: [facility] CATH LAB;  Service: Cardiovascular;  Laterality: Left;  stent to lt renal artery  . RENAL ANGIOGRAM N/A 08/09/2012   Procedure: RENAL ANGIOGRAM;  Surgeon: Elam Dutch, MD;  Location: [facility] CATH LAB;  Service: Cardiovascular;  Laterality: N/A;    Social History   Tobacco Use  Smoking Status Current Every Day Smoker  . Packs/day: 0.50  . Years: 44.00  . Pack years: 22.00  . Types: Cigarettes  Smokeless Tobacco Never Used    Social History   Substance and Sexual Activity  Alcohol Use No  . Alcohol/week: 0.0 standard drinks    Family History  Problem Relation Age of Onset  . Heart disease Father        before age 1  . Hypertension Father   . Hyperlipidemia Father   . Diabetes Father   . Heart attack Father   . Heart disease Mother   . Hypertension Mother   . Hyperlipidemia Mother   . Anesthesia problems Mother        hx. of post-op N/V  . AAA (abdominal aortic aneurysm) Maternal Uncle   . Cancer Maternal Uncle   . Hyperlipidemia Sister     Reviw of Systems:  Reviewed in the HPI.  All other systems are negative.  Physical Exam: There were no vitals taken for this visit.  GEN:  Well nourished, well developed in no acute distress HEENT: Normal NECK: No JVD; No carotid bruits LYMPHATICS: No lymphadenopathy CARDIAC: RRR ***, no murmurs, rubs, gallops RESPIRATORY:  Clear to auscultation without rales, wheezing  or rhonchi  ABDOMEN: Soft, non-tender, non-distended MUSCULOSKELETAL:  No edema; No deformity  SKIN: Warm and dry NEUROLOGIC:  Alert and oriented x 3   ECG:  ***    Assessment / Plan:   1. Coronary artery disease-status post CABG.  ***  2. Hypertension  :   ***.  3. Hyperlipidemia: ***  4. Renal insufficiency  5. Peripheral Vascular disease:     6. Paroxysmal atrial fibrillation:     ***   Will see him in a year   Mertie Moores, MD  06/22/2019 8:07 PM    [place],  [place], [place]  [address] Pager [phone] Phone: [phone]; Fax: [phone]

## 2019-06-23 ENCOUNTER — Ambulatory Visit: Payer: Medicare Other | Admitting: Cardiovascular Disease

## 2019-07-15 ENCOUNTER — Other Ambulatory Visit: Payer: Self-pay | Admitting: Cardiovascular Disease

## 2019-07-15 DIAGNOSIS — I480 Paroxysmal atrial fibrillation: Secondary | ICD-10-CM | POA: Diagnosis not present

## 2019-07-15 DIAGNOSIS — F419 Anxiety disorder, unspecified: Secondary | ICD-10-CM | POA: Diagnosis not present

## 2019-07-15 DIAGNOSIS — K219 Gastro-esophageal reflux disease without esophagitis: Secondary | ICD-10-CM | POA: Diagnosis not present

## 2019-07-17 ENCOUNTER — Other Ambulatory Visit: Payer: Self-pay | Admitting: Cardiovascular Disease

## 2019-08-20 ENCOUNTER — Encounter: Payer: Self-pay | Admitting: *Deleted

## 2019-08-29 ENCOUNTER — Other Ambulatory Visit: Payer: Self-pay

## 2019-08-29 ENCOUNTER — Ambulatory Visit (INDEPENDENT_AMBULATORY_CARE_PROVIDER_SITE_OTHER): Payer: Medicare Other | Admitting: Cardiovascular Disease

## 2019-08-29 ENCOUNTER — Encounter: Payer: Self-pay | Admitting: Cardiovascular Disease

## 2019-08-29 VITALS — BP 104/66 | HR 59 | Ht 70.0 in | Wt 166.6 lb

## 2019-08-29 DIAGNOSIS — I6523 Occlusion and stenosis of bilateral carotid arteries: Secondary | ICD-10-CM | POA: Diagnosis not present

## 2019-08-29 DIAGNOSIS — I480 Paroxysmal atrial fibrillation: Secondary | ICD-10-CM

## 2019-08-29 DIAGNOSIS — Z95828 Presence of other vascular implants and grafts: Secondary | ICD-10-CM | POA: Diagnosis not present

## 2019-08-29 DIAGNOSIS — E782 Mixed hyperlipidemia: Secondary | ICD-10-CM

## 2019-08-29 DIAGNOSIS — I10 Essential (primary) hypertension: Secondary | ICD-10-CM | POA: Diagnosis not present

## 2019-08-29 DIAGNOSIS — E7800 Pure hypercholesterolemia, unspecified: Secondary | ICD-10-CM

## 2019-08-29 DIAGNOSIS — I2510 Atherosclerotic heart disease of native coronary artery without angina pectoris: Secondary | ICD-10-CM

## 2019-08-29 LAB — BASIC METABOLIC PANEL
BUN/Creatinine Ratio: 9 — ABNORMAL LOW (ref 10–24)
BUN: 13 mg/dL (ref 8–27)
CO2: 23 mmol/L (ref 20–29)
Calcium: 9.5 mg/dL (ref 8.6–10.2)
Chloride: 100 mmol/L (ref 96–106)
Creatinine, Ser: 1.37 mg/dL — ABNORMAL HIGH (ref 0.76–1.27)
GFR calc Af Amer: 61 mL/min/{1.73_m2} (ref >59)
GFR calc non Af Amer: 53 mL/min/{1.73_m2} — ABNORMAL LOW (ref >59)
Glucose: 63 mg/dL — ABNORMAL LOW (ref 65–99)
Potassium: 4.2 mmol/L (ref 3.5–5.2)
Sodium: 137 mmol/L (ref 134–144)

## 2019-08-29 LAB — LIPID PANEL
Chol/HDL Ratio: 2.8 ratio (ref 0.0–5.0)
Cholesterol, Total: 100 mg/dL (ref 100–199)
HDL: 36 mg/dL — ABNORMAL LOW (ref >39)
LDL Chol Calc (NIH): 45 mg/dL (ref 0–99)
Triglycerides: 103 mg/dL (ref 0–149)
VLDL Cholesterol Cal: 19 mg/dL (ref 5–40)

## 2019-08-29 LAB — HEPATIC FUNCTION PANEL
ALT: 11 IU/L (ref 0–44)
AST: 16 IU/L (ref 0–40)
Albumin: 4.1 g/dL (ref 3.8–4.8)
Alkaline Phosphatase: 105 IU/L (ref 39–117)
Bilirubin Total: 0.3 mg/dL (ref 0.0–1.2)
Bilirubin, Direct: 0.1 mg/dL (ref 0.00–0.40)
Total Protein: 6.2 g/dL (ref 6.0–8.5)

## 2019-08-29 NOTE — Progress Notes (Signed)
Tom [name] Date of Birth  03/23/1951 [facility] [address]    [place], [place]  [address] [phone]  Fax  [phone]  Problem list: 1. Coronary artery disease-status post CABG. February 2007. He had a LIMA  to the LAD and a SVG to the right coronary artery. 2. Hypertension 3. Hyperlipidemia 4. Renal insufficiency 5. Peripheral Vascular disease:  S/p AAA stent graft repair ( Oct. 2013)  s/p renal artery stenting  ( Dec. 2013) 6. Paroxysmal atrial fibrillation:      69 y.o. year-old gentleman with a history of coronary disease. He status post coronary artery bypass grafting in February 2007. He had a left internal mammary artery to the LAD and a saphenous vein graft to the right coronary artery. He also has a history of hypercholesterolemia, hypertension, and ongoing cigarette smoking. He's not had any episodes of chest pain, shortness breath, syncope, or presyncope.  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 He does not do any regular aerobic exercise. He's not had any recurrent episodes of chest pain. He tries to watch his salt intake.  He still smokes about a half a pack of cigarettes a day but has cut back from 2 packs a day. 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  November 22, 2012:  He has had an AAA stent graft repair and renal artery stenting since I last saw him.  He has had several episodes of chest pressure / tightness.  He found his BP and Hr  to be elevated.  He took and extra Toprol and ASA and felt better within an hour.  Oct. 24, 2014:  Tom [name] is having CP and palpitations .  Some relief with Inderal but occasionally has to take some NTG.  Symptoms seen to occur more at night - not during the day while he is working.  stil smoking 1/2 ppd.  Nov. 21, 2014:  Tom [name] is doing ok.  He was diagnosed with atrial fib by event monitor.  He has had several episodes of PAF - resolved after several  propranolol  Jan 02, 2014:  He is doing well.  Still have PAF - probably once a month.  Will last 4-6 hours.   He does not drink ETOH.   still smoking some .  Works as a Curator.   Oct. 24, 2016:  No CP ,  No recent episodes of PAF since starting the Multaq.  Was started by Dr. Lovena Le in Oct. 2015 Has not been taking his amlodipine due to leg swelling . Doing well.   BP is typcially ok  Getting some exercise Yard work  Retired from painting    Oct. 24, 2017:  Tom [name] is doing well.  Thinks he forgot his BP meds today .  Typical BP is well controlled.  Hs PAF - was started on Multaq.   Oct. 25, 2018:  Has lost 25 lbs. - purposeful weight loss Started cutting back on his BP meds Stopped Valsartan when it was recalled.  Has had orthostasis - once while using a chain saw. Several episodes of orthostasis getting up to the BR at night .   No CP , no dyspnea No syncope since adjusting his meds.  Cut his toprol XL to 50  Changed valsartan to Losartan  Has not had any a-fib since being on Multaq  Oct. 31, 2019:  Tom [name] is seen today for follow up of his CAD,  HTN,  HLD   and PAF .   Wt. Is 168-  Had an episode of leg swelling on 1 occasion. Is on Multaq - his primary MD gave him some HCTZ to take on occasion .  Seems to be related to being on his feet.  Takes the HCTZ maybe 1-2 times a week .  Thinks he may be able to get eliquis cheaper than Xarelto .  Has been taking the Multaq once a day due to cost  Encouraged him to see Dr. Lovena Le again if he wants to switch .   Jan. 8, 2021: Tom [name] is seen today for follow-up visit.  He has a history of coronary artery disease and is status post coronary artery bypass grafting.  He has a history of paroxysmal atrial fibrillation. No recent episodes of AF. Is active around the house - cuttng trees,  Also keeping up his mother house.     Current Outpatient Medications on File Prior to Visit  Medication Sig Dispense Refill  .  alprazolam (XANAX) 2 MG tablet Take 2 mg by mouth at bedtime.    [name] Kitchen aspirin 81 MG tablet Take 1 tablet (81 mg total) by mouth daily.    [name] Kitchen atorvastatin (LIPITOR) 40 MG tablet Take 1 tablet (40 mg total) by mouth daily. 90 tablet 3  . dronedarone (MULTAQ) 400 MG tablet Take 1 tablet (400 mg total) by mouth daily. 90 tablet 0  . metoprolol succinate (TOPROL-XL) 50 MG 24 hr tablet TAKE 1 TABLET EVERY DAY  WITH  OR  IMMEDIATELY  FOLLOWING A MEAL 90 tablet 0  . olmesartan (BENICAR) 20 MG tablet Take 1 tablet by mouth daily.    . pantoprazole (PROTONIX) 40 MG tablet Take 40 mg by mouth daily.      . rivaroxaban (XARELTO) 20 MG TABS tablet Take 1 tablet (20 mg total) by mouth daily with supper. 90 tablet 1  . VITAMIN D, CHOLECALCIFEROL, PO Take 1 tablet by mouth daily.     No current facility-administered medications on file prior to visit.    Allergies  Allergen Reactions  . Niacin And Related Hives  . Codeine Other (See Comments)    GI upset ONLY in the cough syrup   . Niacin Hives    Past Medical History:  Diagnosis Date  . AAA (abdominal aortic aneurysm) ([place])   . Anxiety   . Atrial fibrillation ([place]) 07/11/2013  . CAD (coronary artery disease) 01/27/2011  . Cancer [facility]) 2013   skin cancer,   MOHS surgery  . Chronic pain of left knee 02/05/2018  . Coronary artery disease   . Dental bridge present    upper  . Dental crowns present   . Diarrhea 06/17/2010   Qualifier: Diagnosis of  By: Gala Romney MD, Quentin Ore, R. Legrand Como    . Family history of adverse reaction to anesthesia    pt's mother has hx. of post-op N/V  . Foreign body of finger of left hand 12/2014   index finger  . GERD (gastroesophageal reflux disease)   . Hypercholesteremia   . Hypertension    states under control with med., has been on med. x 20 yr.  . Hyponatremia 10/10/2018  . Myocardial infarction ([place]) 1997  . PAF (paroxysmal atrial fibrillation) ([place])   . Syncope 10/10/2018  . Tobacco abuse 01/27/2011  . Wears glasses      Past Surgical History:  Procedure Laterality Date  . ABDOMINAL AORTIC ANEURYSM REPAIR  06/12/2012  . CARDIAC CATHETERIZATION  10/03/2005  .  COLONOSCOPY    . CORONARY ARTERY BYPASS GRAFT  10/16/2005   LIMA to LAD saphenous vein to RCA  . FOREIGN BODY REMOVAL Left 01/08/2015   Procedure: EXCISION FOREIGN BODY ;  Surgeon: Daryll Brod, MD;  Location: [facility];  Service: Orthopedics;  Laterality: Left;  ANESTHESIA:  GENERAL, IV REGIONAL UPPER ARM  . I & D EXTREMITY Left 11/12/2014   Procedure: IRRIGATION AND DEBRIDEMENT LEFT INDEX FINGER;  Surgeon: Daryll Brod, MD;  Location: [facility];  Service: Orthopedics;  Laterality: Left;  . INCISION AND DRAINAGE ABSCESS Left 12/22/2014   Procedure: INCISION AND DRAINAGE ABSCESS LEFT INDEX FINGER;  Surgeon: Daryll Brod, MD;  Location: [facility];  Service: Orthopedics;  Laterality: Left;  . PERCUTANEOUS STENT INTERVENTION Left 08/09/2012   Procedure: PERCUTANEOUS STENT INTERVENTION;  Surgeon: Elam Dutch, MD;  Location: [facility] CATH LAB;  Service: Cardiovascular;  Laterality: Left;  stent to lt renal artery  . RENAL ANGIOGRAM N/A 08/09/2012   Procedure: RENAL ANGIOGRAM;  Surgeon: Elam Dutch, MD;  Location: [facility] CATH LAB;  Service: Cardiovascular;  Laterality: N/A;    Social History   Tobacco Use  Smoking Status Current Every Day Smoker  . Packs/day: 0.50  . Years: 44.00  . Pack years: 22.00  . Types: Cigarettes  Smokeless Tobacco Never Used    Social History   Substance and Sexual Activity  Alcohol Use No  . Alcohol/week: 0.0 standard drinks    Family History  Problem Relation Age of Onset  . Heart disease Father        before age 63  . Hypertension Father   . Hyperlipidemia Father   . Diabetes Father   . Heart attack Father   . Heart disease Mother   . Hypertension Mother   . Hyperlipidemia Mother   . Anesthesia problems Mother        hx. of post-op N/V  . AAA (abdominal aortic  aneurysm) Maternal Uncle   . Cancer Maternal Uncle   . Hyperlipidemia Sister     Reviw of Systems:  Reviewed in the HPI.  All other systems are negative.  Physical Exam: There were no vitals taken for this visit.  GEN:  Well nourished, well developed in no acute distress HEENT: Normal NECK: No JVD; No carotid bruits LYMPHATICS: No lymphadenopathy CARDIAC: RRR , no murmurs, rubs, gallops RESPIRATORY:  Clear to auscultation without rales, wheezing or rhonchi  ABDOMEN: Soft, non-tender, non-distended MUSCULOSKELETAL:  No edema; No deformity  SKIN: Warm and dry NEUROLOGIC:  Alert and oriented x 3   ECG:  Jan. 8, 2021 sinus brady at 59.  No ST o rT wave change  Assessment / Plan:   1. Coronary artery disease-status post CABG.   No angina  .  Advised regular exercise   2. Hypertension  :    BP is well controlled   3. Hyperlipidemia:     Check lipids, bmp, liver enz today  4. Renal insufficiency  5. Peripheral Vascular disease:    Encouraged smoking cessation  6. Paroxysmal atrial fibrillation:      He is on Multaq 400 mg once a day.  This seems to be controlling his atrial fibrillation fairly well.  Continue Xarelto.   Will see him in a year   Mertie Moores, MD  08/29/2019 8:30 AM    [address],  [place], [place]  [address] Pager [phone] Phone: [phone]; Fax: [phone]     

## 2019-08-29 NOTE — Patient Instructions (Signed)
Medication Instructions:  Your physician recommends that you continue on your current medications as directed. Please refer to the Current Medication list given to you today.  *If you need a refill on your cardiac medications before your next appointment, please call your pharmacy*  Lab Work: TODAY - cholesterol, liver panel, basic metabolic panel If you have labs (blood work) drawn today and your tests are completely normal, you will receive your results only by: . MyChart Message (if you have MyChart) OR . A paper copy in the mail If you have any lab test that is abnormal or we need to change your treatment, we will call you to review the results.   Testing/Procedures: None Ordered   Follow-Up: At [facility], you and your health needs are our priority.  As part of our continuing mission to provide you with exceptional heart care, we have created designated Provider Care Teams.  These Care Teams include your primary Cardiologist (physician) and Advanced Practice Providers (APPs -  Physician Assistants and Nurse Practitioners) who all work together to provide you with the care you need, when you need it.  Your next appointment:   1 year(s)  The format for your next appointment:   In Person  Provider:   You may see Philip Nahser, MD or one of the following Advanced Practice Providers on your designated Care Team:    Scott Weaver, PA-C  Vin Bhagat, PA-C  Janine Hammond, NP    

## 2019-09-02 DIAGNOSIS — L570 Actinic keratosis: Secondary | ICD-10-CM | POA: Diagnosis not present

## 2019-09-02 DIAGNOSIS — L821 Other seborrheic keratosis: Secondary | ICD-10-CM | POA: Diagnosis not present

## 2019-09-02 DIAGNOSIS — D225 Melanocytic nevi of trunk: Secondary | ICD-10-CM | POA: Diagnosis not present

## 2019-09-02 DIAGNOSIS — Z85828 Personal history of other malignant neoplasm of skin: Secondary | ICD-10-CM | POA: Diagnosis not present

## 2019-09-10 ENCOUNTER — Ambulatory Visit: Payer: Medicare Other

## 2019-09-15 ENCOUNTER — Ambulatory Visit: Payer: Medicare Other | Attending: Internal Medicine

## 2019-09-15 DIAGNOSIS — Z23 Encounter for immunization: Secondary | ICD-10-CM | POA: Insufficient documentation

## 2019-09-15 NOTE — Progress Notes (Signed)
   Covid-19 Vaccination Clinic  Name:  Tom [name]    MRN: [id number] DOB: 12/17/1950  09/15/2019  [name] was observed post Covid-19 immunization for 15 minutes without incidence. He was provided with Vaccine Information Sheet and instruction to access the V-Safe system.   [name] was instructed to call 911 with any severe reactions post vaccine: [name] Kitchen Difficulty breathing  . Swelling of your face and throat  . A fast heartbeat  . A bad rash all over your body  . Dizziness and weakness    Immunizations Administered    Name Date Dose VIS Date Route   Pfizer COVID-19 Vaccine 09/15/2019  8:24 AM 0.3 mL 08/01/2019 Intramuscular   Manufacturer: [place]   Lot: [id number]   [place]: [id number]

## 2019-10-06 ENCOUNTER — Ambulatory Visit: Payer: Medicare Other | Attending: Internal Medicine

## 2019-10-06 DIAGNOSIS — Z23 Encounter for immunization: Secondary | ICD-10-CM | POA: Insufficient documentation

## 2019-10-06 NOTE — Progress Notes (Signed)
   Covid-19 Vaccination Clinic  Name:  Tom [name]    MRN: [id number] DOB: 11-10-1950  10/06/2019  [name] was observed post Covid-19 immunization for 15 minutes without incidence. He was provided with Vaccine Information Sheet and instruction to access the V-Safe system.   [name] was instructed to call 911 with any severe reactions post vaccine: [name] Kitchen Difficulty breathing  . Swelling of your face and throat  . A fast heartbeat  . A bad rash all over your body  . Dizziness and weakness    Immunizations Administered    Name Date Dose VIS Date Route   Pfizer COVID-19 Vaccine 10/06/2019  8:42 AM 0.3 mL 08/01/2019 Intramuscular   Manufacturer: [place]   Lot: [id number]   [place]: [id number]

## 2019-10-07 ENCOUNTER — Other Ambulatory Visit: Payer: Self-pay | Admitting: Cardiovascular Disease

## 2019-10-07 NOTE — Telephone Encounter (Signed)
Last OV 08/29/19 Scr 1.37 on 08/29/19 Crcl=56 ml/min

## 2019-10-15 ENCOUNTER — Other Ambulatory Visit: Payer: Self-pay

## 2019-10-15 DIAGNOSIS — I701 Atherosclerosis of renal artery: Secondary | ICD-10-CM

## 2019-10-15 DIAGNOSIS — I714 Abdominal aortic aneurysm, without rupture, unspecified: Secondary | ICD-10-CM

## 2019-10-16 ENCOUNTER — Ambulatory Visit (INDEPENDENT_AMBULATORY_CARE_PROVIDER_SITE_OTHER): Payer: Medicare Other | Admitting: Physician Assistant

## 2019-10-16 ENCOUNTER — Ambulatory Visit (HOSPITAL_COMMUNITY)
Admission: RE | Admit: 2019-10-16 | Discharge: 2019-10-16 | Disposition: A | Discharge status: Home / Self Care | Payer: Medicare Other | Source: Ambulatory Visit | Attending: Vascular Surgery | Admitting: Vascular Surgery

## 2019-10-16 ENCOUNTER — Other Ambulatory Visit: Payer: Self-pay

## 2019-10-16 ENCOUNTER — Ambulatory Visit (INDEPENDENT_AMBULATORY_CARE_PROVIDER_SITE_OTHER)
Admission: RE | Admit: 2019-10-16 | Discharge: 2019-10-16 | Disposition: A | Discharge status: Home / Self Care | Payer: Medicare Other | Source: Ambulatory Visit | Attending: Vascular Surgery | Admitting: Vascular Surgery

## 2019-10-16 ENCOUNTER — Other Ambulatory Visit: Payer: Self-pay | Admitting: *Deleted

## 2019-10-16 VITALS — BP 166/93 | HR 49 | Temp 96.5°F | Resp 18 | Ht 70.0 in | Wt 160.0 lb

## 2019-10-16 DIAGNOSIS — I6523 Occlusion and stenosis of bilateral carotid arteries: Secondary | ICD-10-CM

## 2019-10-16 DIAGNOSIS — I714 Abdominal aortic aneurysm, without rupture, unspecified: Secondary | ICD-10-CM

## 2019-10-16 DIAGNOSIS — I701 Atherosclerosis of renal artery: Secondary | ICD-10-CM | POA: Insufficient documentation

## 2019-10-16 DIAGNOSIS — I6529 Occlusion and stenosis of unspecified carotid artery: Secondary | ICD-10-CM | POA: Insufficient documentation

## 2019-10-16 NOTE — Progress Notes (Signed)
Established EVAR   History of Present Illness   Tom [name] is a 69 y.o. (October 23, 1950) male who presents for routine follow up s/p EVAR by Dr. Oneida Alar in 2013.  He denies new or changing abdominal or back pain.  Also denies any claudication, rest pain, or discoloration of bilateral lower extremities.  He is a current every day smoker.  He is on aspirin and statin daily.  He is also on Xarelto for atrial fibrillation.  He is also followed for renal artery stenosis having had a left renal artery stents by Dr. Oneida Alar also in 2013.  Patient states his blood pressure is normally well controlled.  He is on a 2 antihypertensive drug regimen.  No recent medication changes by PCP or cardiology.  He is also followed for carotid artery stenosis.  He denies any history of TIA or CVA.  He denies any strokelike symptoms since last office visit including slurring speech, changes in vision, or one-sided weakness.  The patient's PMH, PSH, SH, and FamHx were reviewed and are unchanged from prior visit.  Current Outpatient Medications  Medication Sig Dispense Refill  . alprazolam (XANAX) 2 MG tablet Take 2 mg by mouth at bedtime.    [name] Kitchen aspirin 81 MG tablet Take 1 tablet (81 mg total) by mouth daily.    [name] Kitchen atorvastatin (LIPITOR) 40 MG tablet Take 1 tablet (40 mg total) by mouth daily. 90 tablet 3  . dronedarone (MULTAQ) 400 MG tablet Take 1 tablet (400 mg total) by mouth daily. 90 tablet 0  . metoprolol succinate (TOPROL-XL) 50 MG 24 hr tablet TAKE 1 TABLET EVERY DAY  WITH  OR  IMMEDIATELY  FOLLOWING A MEAL 90 tablet 0  . olmesartan (BENICAR) 20 MG tablet Take 1 tablet by mouth daily.    . pantoprazole (PROTONIX) 40 MG tablet Take 40 mg by mouth daily.      [name] Kitchen VITAMIN D, CHOLECALCIFEROL, PO Take 1 tablet by mouth daily.    Alveda Reasons 20 MG TABS tablet TAKE 1 TABLET  DAILY WITH SUPPER. 90 tablet 1   No current facility-administered medications for this visit.    On ROS today: 10 system ROS negative unless  otherwise noted in HPI   Physical Examination   Vitals:   10/16/19 1004 10/16/19 1007  BP: (!) 142/84 (!) 166/93  Pulse: (!) 49   Resp: 18   Temp: (!) 96.5 F (35.8 C)   TempSrc: Temporal   SpO2: 94%   Weight: 160 lb (72.6 kg)   Height: 5\' 10"  (1.778 m)    Body mass index is 22.96 kg/m.  General Alert, O x 3, WD, NAD  Pulmonary Sym exp, good B air movt,  Cardiac RRR, Nl S1, S2,  Gastro- intestinal soft, non-distended, non-tender to palpation, No guarding or rebound,   Musculo- skeletal M/S 5/5 throughout  , Extremities without ischemic changes  , No edema present, No visible varicosities , No Lipodermatosclerosis present; feet warm and well perfused  Neurologic A&O    Non-Invasive Vascular Imaging   EVAR Duplex   AAA sac size: 3.4 cm  no endoleak detected  Renal Duplex  Patent left renal artery stent  Carotid duplex  Right ICA 1 to 39% stenosis  Left ICA 40 to 59% stenosis   Medical Decision Making   Tom [name] is a 69 y.o. male who presents for routine surveillance related to previous EVAR, left renal artery stent, and carotid artery stenosis   EVAR duplex demonstrates a stable  aneurysmal sac and no evidence of endoleak  Repeat EVAR duplex in 1 year  Continue aspirin and statin daily   Renal duplex demonstrates a patent left renal artery stent and no hemodynamically significant stenosis bilaterally  Patient does have renal cysts on both kidneys however states that his renal function is monitored by his PCP  Repeat renal duplex in 1 year   Carotid duplex demonstrates right ICA 1 to 39% stenosis and left ICA 40 to 59% stenosis  No indication for revascularization of left ICA at this time  Repeat carotid duplex in 1 year   Dagoberto Ligas PA-C [facility] Office: [phone]  Clinic MD: Dr. Scot Dock

## 2019-10-17 ENCOUNTER — Other Ambulatory Visit: Payer: Self-pay | Admitting: *Deleted

## 2019-10-17 DIAGNOSIS — I701 Atherosclerosis of renal artery: Secondary | ICD-10-CM

## 2019-10-17 DIAGNOSIS — I6523 Occlusion and stenosis of bilateral carotid arteries: Secondary | ICD-10-CM

## 2019-10-17 DIAGNOSIS — Z95828 Presence of other vascular implants and grafts: Secondary | ICD-10-CM

## 2019-11-15 ENCOUNTER — Other Ambulatory Visit: Payer: Self-pay | Admitting: Cardiovascular Disease

## 2019-11-17 DIAGNOSIS — I480 Paroxysmal atrial fibrillation: Secondary | ICD-10-CM | POA: Diagnosis not present

## 2019-11-17 DIAGNOSIS — K219 Gastro-esophageal reflux disease without esophagitis: Secondary | ICD-10-CM | POA: Diagnosis not present

## 2019-11-17 DIAGNOSIS — F419 Anxiety disorder, unspecified: Secondary | ICD-10-CM | POA: Diagnosis not present

## 2019-11-18 MED ORDER — MULTAQ 400 MG PO TABS: 400.0000 mg | ORAL_TABLET | Freq: Every day | ORAL | 2 refills | Status: DC

## 2020-03-18 DIAGNOSIS — I714 Abdominal aortic aneurysm, without rupture: Secondary | ICD-10-CM | POA: Diagnosis not present

## 2020-03-18 DIAGNOSIS — K219 Gastro-esophageal reflux disease without esophagitis: Secondary | ICD-10-CM | POA: Diagnosis not present

## 2020-03-18 DIAGNOSIS — I480 Paroxysmal atrial fibrillation: Secondary | ICD-10-CM | POA: Diagnosis not present

## 2020-03-18 DIAGNOSIS — I2510 Atherosclerotic heart disease of native coronary artery without angina pectoris: Secondary | ICD-10-CM | POA: Diagnosis not present

## 2020-04-06 ENCOUNTER — Encounter (INDEPENDENT_AMBULATORY_CARE_PROVIDER_SITE_OTHER): Payer: Self-pay | Admitting: Gastroenterology

## 2020-04-07 ENCOUNTER — Other Ambulatory Visit: Payer: Self-pay | Admitting: Cardiovascular Disease

## 2020-04-07 NOTE — Telephone Encounter (Signed)
Age 69, weight 72.6kg, SCr 1.37 on 08/29/19, CrCl 42mL/min, afib indication, last visit Jan 2021.

## 2020-04-22 ENCOUNTER — Ambulatory Visit (INDEPENDENT_AMBULATORY_CARE_PROVIDER_SITE_OTHER): Payer: Medicare Other | Admitting: Gastroenterology

## 2020-04-22 ENCOUNTER — Other Ambulatory Visit: Payer: Self-pay

## 2020-04-22 ENCOUNTER — Encounter (INDEPENDENT_AMBULATORY_CARE_PROVIDER_SITE_OTHER): Payer: Self-pay | Admitting: Gastroenterology

## 2020-04-22 DIAGNOSIS — R072 Precordial pain: Secondary | ICD-10-CM | POA: Diagnosis not present

## 2020-04-22 DIAGNOSIS — R142 Eructation: Secondary | ICD-10-CM | POA: Diagnosis not present

## 2020-04-22 NOTE — Patient Instructions (Addendum)
Can continue current medications for now Reach cardiologist regarding clearance to undergo endoscopic procedure Schedule EGD once cleared by your cardiologist Practice diaphragmatic breathing Avoid talking when eating meals

## 2020-04-22 NOTE — Progress Notes (Signed)
Tom [name], M.D. Gastroenterology & Hepatology [facility] [address] [place], [place] [address] Primary Care Physician: Asencion Noble, MD [address] [place] [address]  Referring MD: PCP  I will communicate my assessment and recommendations to the referring MD via EMR. Note: Occasional unusual wording and randomly placed punctuation marks may result from the use of speech recognition technology to transcribe this document"  Chief Complaint: Pancreatic lesion  History of Present Illness: Tom [name] is a 69 y.o. male with past medical history of AAA status post stent placement, atrial fibrillation on Xarelto, peripheral artery disease, coronary artery disease status post CABG, skin cancer, GERD with prior history of Barrett's esophagus, myocardial infarction, hypertension, hyperlipidemia, who presents for evaluation of retrosternal chest discomfort.  Patient reports that for the last year and a half he has had episodes of postprandial retrosternal chest tightness usually 30 minutes after eating meals.  The patient states that he has never had any symptoms while sleeping but he notices that his symptoms are worse at night usually when he eats his largest meal.  He also believes that he has had some dysphagia to both solids and liquids recently but this is not frequent.  But describes the tightness as "gas getting trapped in his chest".  He has also noticed that the pain when he eats his symptoms may be worse, specifically when eating things like onion or [name].  He also has a sensation of burping but he is not able to do this all the time.  The patient comes with his wife both report that he likes to talk frequently when eating his meals.  He has taken Gas-X and Tums in the past without any improvement.  Importantly, the patient denies having any heartburn or odynophagia in the past at all, however he reports that after his  MI he was started on Prilosec "to prevent some potential heartburn".  Nevertheless, the patient reports that given the new symptoms he was developing he was initially switched to Protonix 40 mg in the morning and at night which initially helped to decrease his symptoms.  However, since his symptoms recurred he was put on Pepcid twice daily as well which he thinks has helped to alleviate the frequency of his symptoms.  Notably, he reports that when he eats ice cream his symptoms improved substantially.  The patient saw his cardiologist for last time on November 2020 but has not had any stress test recently.  The patient denies having any nausea, vomiting, fever, chills, hematochezia, melena, hematemesis, abdominal distention, abdominal pain, diarrhea, jaundice, pruritus.  He reports that 3 years ago used to weigh 30 pounds more but he change his diet and with taking soft drinks, however for the last year and a half his weight has been stable.  Last EGD: Never Last Colonoscopy: 13 years ago, was told he had hemorrhoids and diverticulosis.  Not interested in pursuing this at this moment.  FHx: neg for any gastrointestinal/liver disease, alcoholic history of esophageal cancer, father had prostate cancer Social: smokes since he was 69 years old has been smoking 1/2 pack/day but has tried to cut down to only 10 cigarettes/day, denies alcohol or illicit drug use  Past Medical History: Past Medical History:  Diagnosis Date  . AAA (abdominal aortic aneurysm) ([place])   . Anxiety   . Anxiety   . Atrial fibrillation ([place]) 07/11/2013  . CAD (coronary artery disease) 01/27/2011  . Cancer [facility]) 2013   skin  cancer,   MOHS surgery  . Chronic pain of left knee 02/05/2018  . Coronary artery disease   . Dental bridge present    upper  . Dental crowns present   . Diarrhea 06/17/2010   Qualifier: Diagnosis of  By: Gala Romney MD, Quentin Ore, R. Legrand Como    . Family history of adverse reaction to anesthesia    pt's mother  has hx. of post-op N/V  . Foreign body of finger of left hand 12/2014   index finger  . GERD (gastroesophageal reflux disease)   . Hypercholesteremia   . Hyperlipidemia   . Hypertension    states under control with med., has been on med. x 20 yr.  . Hyponatremia 10/10/2018  . Myocardial infarction ([place]) 1997  . PAF (paroxysmal atrial fibrillation) ([place])   . Syncope 10/10/2018  . Tobacco abuse 01/27/2011  . Wears glasses     Past Surgical History: Past Surgical History:  Procedure Laterality Date  . ABDOMINAL AORTIC ANEURYSM REPAIR  06/12/2012  . CARDIAC CATHETERIZATION  10/03/2005  . COLONOSCOPY    . CORONARY ARTERY BYPASS GRAFT  10/16/2005   LIMA to LAD saphenous vein to RCA  . FOREIGN BODY REMOVAL Left 01/08/2015   Procedure: EXCISION FOREIGN BODY ;  Surgeon: Daryll Brod, MD;  Location: [facility];  Service: Orthopedics;  Laterality: Left;  ANESTHESIA:  GENERAL, IV REGIONAL UPPER ARM  . I & D EXTREMITY Left 11/12/2014   Procedure: IRRIGATION AND DEBRIDEMENT LEFT INDEX FINGER;  Surgeon: Daryll Brod, MD;  Location: [facility];  Service: Orthopedics;  Laterality: Left;  . INCISION AND DRAINAGE ABSCESS Left 12/22/2014   Procedure: INCISION AND DRAINAGE ABSCESS LEFT INDEX FINGER;  Surgeon: Daryll Brod, MD;  Location: [facility];  Service: Orthopedics;  Laterality: Left;  . PERCUTANEOUS STENT INTERVENTION Left 08/09/2012   Procedure: PERCUTANEOUS STENT INTERVENTION;  Surgeon: Elam Dutch, MD;  Location: [facility] CATH LAB;  Service: Cardiovascular;  Laterality: Left;  stent to lt renal artery  . RENAL ANGIOGRAM N/A 08/09/2012   Procedure: RENAL ANGIOGRAM;  Surgeon: Elam Dutch, MD;  Location: [facility] CATH LAB;  Service: Cardiovascular;  Laterality: N/A;    Family History: Family History  Problem Relation Age of Onset  . Heart disease Father        before age 27  . Hypertension Father   . Hyperlipidemia Father   . Diabetes Father   . Heart attack  Father   . Heart disease Mother   . Hypertension Mother   . Hyperlipidemia Mother   . Anesthesia problems Mother        hx. of post-op N/V  . AAA (abdominal aortic aneurysm) Maternal Uncle   . Cancer Maternal Uncle   . Hyperlipidemia Sister     Social History: Social History   Tobacco Use  Smoking Status Current Every Day Smoker  . Packs/day: 0.50  . Years: 44.00  . Pack years: 22.00  . Types: Cigarettes  Smokeless Tobacco Never Used   Social History   Substance and Sexual Activity  Alcohol Use No  . Alcohol/week: 0.0 standard drinks   Social History   Substance and Sexual Activity  Drug Use No    Allergies: Allergies  Allergen Reactions  . Niacin And Related Hives  . Codeine Other (See Comments)    GI upset ONLY in the cough syrup   . Niacin Hives    Medications: Current Outpatient Medications  Medication Sig Dispense Refill  . alprazolam Duanne Moron)  2 MG tablet Take 2 mg by mouth at bedtime.    [name] Kitchen aspirin 81 MG tablet Take 1 tablet (81 mg total) by mouth daily.    [name] Kitchen atorvastatin (LIPITOR) 40 MG tablet Take 1 tablet (40 mg total) by mouth daily. 90 tablet 3  . dronedarone (MULTAQ) 400 MG tablet Take 1 tablet (400 mg total) by mouth daily. 90 tablet 2  . famotidine (PEPCID) 20 MG tablet Take 20 mg by mouth at bedtime.    . metoprolol succinate (TOPROL-XL) 50 MG 24 hr tablet TAKE 1 TABLET EVERY DAY  WITH  OR  IMMEDIATELY  FOLLOWING A MEAL 90 tablet 0  . olmesartan (BENICAR) 20 MG tablet Take 1 tablet by mouth daily.    . pantoprazole (PROTONIX) 40 MG tablet Take 40 mg by mouth 2 (two) times daily.     [name] Kitchen VITAMIN D, CHOLECALCIFEROL, PO Take 1 tablet by mouth daily.    Alveda Reasons 20 MG TABS tablet TAKE 1 TABLET  DAILY WITH SUPPER. 90 tablet 1  . Zinc 50 MG CAPS Take 50 mg by mouth daily.     No current facility-administered medications for this visit.    Review of Systems: GENERAL: negative for malaise, night sweats HEENT: No changes in hearing or vision, no nose  bleeds or other nasal problems. NECK: Negative for lumps, goiter, pain and significant neck swelling RESPIRATORY: Negative for cough, wheezing CARDIOVASCULAR: Negative for chest pain, leg swelling, palpitations, orthopnea GI: SEE HPI MUSCULOSKELETAL: Negative for joint pain or swelling, back pain, and muscle pain. SKIN: Negative for lesions, rash PSYCH: Negative for sleep disturbance, mood disorder and recent psychosocial stressors. HEMATOLOGY Negative for prolonged bleeding, bruising easily, and swollen nodes. ENDOCRINE: Negative for cold or heat intolerance, polyuria, polydipsia and goiter. NEURO: negative for tremor, gait imbalance, syncope and seizures. The remainder of the review of systems is noncontributory.   Physical Exam: BP (!) 168/95 (BP Location: Right Arm, Patient Position: Sitting, Cuff Size: Small)   Pulse (!) 48   Temp 97.7 F (36.5 C) (Oral)   Ht 5\' 10"  (1.778 m)   Wt 171 lb 12.8 oz (77.9 kg)   BMI 24.65 kg/m  GENERAL: The patient is AO x3, in no acute distress. HEENT: Head is normocephalic and atraumatic. EOMI are intact. Mouth is well hydrated and without lesions. NECK: Supple. No masses LUNGS: Clear to auscultation. No presence of rhonchi/wheezing/rales. Adequate chest expansion HEART: Irregularly irregular, normal s1 and s2. ABDOMEN: Soft, nontender, no guarding, no peritoneal signs, and nondistended. BS +. No masses. EXTREMITIES: Without any cyanosis, clubbing, rash, lesions or edema. NEUROLOGIC: AOx3, no focal motor deficit. SKIN: no jaundice, no rashes   Imaging/Labs: as above  I personally reviewed and interpreted the available labs, imaging and endoscopic files.  Impression and Plan: MAOR MECKEL is a 69 y.o. male with past medical history of AAA status post stent placement, atrial fibrillation on Xarelto, peripheral artery disease, coronary artery disease status post CABG, skin cancer, GERD with prior history of Barrett's esophagus, myocardial  infarction, hypertension, hyperlipidemia, who presents for evaluation of retrosternal chest discomfort.  The symptoms are not typical for GERD by itself, which is even supported by the fact his symptoms have now resolved while taking max dose PPI and anti-H2.  Differential diagnosis for his symptoms include possible aerophagia versus rumination syndrome, also possible esophageal hypersensitivity.  Nevertheless it would be important to investigate other organic conditions with an EGD.  However, the patient has significant cardiac and vascular history, for which  we will need to obtain clearance by his cardiologist before proceeding with this test including ruling out cardiac ischemia as an etiology for his current presentation.  For now he can continue taking his medications as he is currently taking.  I advised the patient that if his EGD is negative the next step will be to perform an esophageal manometry and pH impedance testing off PPI and anti-H2, which she understood and agreed.  For now, he may also benefit of implementing diaphragmatic breathing and avoiding talking while eating as these may worsen his symptoms.  - Can continue current medications for now, including PPI and anti H2 - Reach cardiologist regarding clearance to undergo endoscopic procedure - Schedule EGD once cleared by cardiologist - Practice diaphragmatic breathing - Avoid talking when eating meals  All questions were answered.      Tom Peppers, MD Gastroenterology and Hepatology [facility]

## 2020-04-27 ENCOUNTER — Telehealth (INDEPENDENT_AMBULATORY_CARE_PROVIDER_SITE_OTHER): Payer: Self-pay | Admitting: *Deleted

## 2020-04-27 ENCOUNTER — Telehealth: Payer: Self-pay | Admitting: *Deleted

## 2020-04-27 ENCOUNTER — Encounter (INDEPENDENT_AMBULATORY_CARE_PROVIDER_SITE_OTHER): Payer: Self-pay | Admitting: *Deleted

## 2020-04-27 NOTE — Telephone Encounter (Signed)
Left message to call back  Kerin Ransom PA-C 04/27/2020 3:37 PM

## 2020-04-27 NOTE — Telephone Encounter (Signed)
Cardiology clearance request has been faxed to cardiologist

## 2020-04-27 NOTE — Telephone Encounter (Signed)
° °  [facility] Pre-operative Risk Assessment    HEARTCARE STAFF: - Please ensure there is not already an duplicate clearance open for this procedure. - Under Visit Info/Reason for Call, type in Other and utilize the format Clearance MM/DD/YY or Clearance TBD. Do not use dashes or single digits. - If request is for dental extraction, please clarify the # of teeth to be extracted.  Request for surgical clearance:  1. What type of surgery is being performed?  EGD   2. When is this surgery scheduled?  TBD   3. What type of clearance is required (medical clearance vs. Pharmacy clearance to hold med vs. Both)?  BOTH  4. Are there any medications that need to be held prior to surgery and how long? XARELTO X'S 2 DAYS    5. Practice name and name of physician performing surgery?  [facility] / DR. CASTANEDA   6. What is the office phone number?  [phone]   7.   What is the office fax number?  [phone]  8.   Anesthesia type (None, local, MAC, general) ?  PROPOFOL   Tom [name] 04/27/2020, 1:34 PM  _________________________________________________________________   (provider comments below)

## 2020-04-27 NOTE — Telephone Encounter (Signed)
Hi Ann, Thanks for your message. It should be a letter asking them if the patient is medically cleared to undergo an EGD under propofol sedation with MAC protocol.  Thanks,  Maylon Peppers, MD Gastroenterology and Hepatology [facility]

## 2020-04-27 NOTE — Telephone Encounter (Signed)
Patient called - he spoke to his cardiologist and they need [place] to fax a paper for them to give clearance for procedure - all I have to send is for clearance on stopping blood thinner - let me know what it needs to to say and I will type up and fax. thanks

## 2020-04-27 NOTE — Telephone Encounter (Signed)
Patient with diagnosis of afib on Xarelto for anticoagulation.    Procedure: EGD Date of procedure: TBD  CHADS2-VASc score of 3 (age, HTN, CAD/PVD)  CrCl 62mL/min Platelet count 300K  Per office protocol, patient can hold Xarelto for 2 days prior to procedure as requested.

## 2020-04-27 NOTE — Telephone Encounter (Signed)
Pharmacy please contact the patient regarding anticoagulation and then I will contact him for cardiology clearance.  Kerin Ransom PA-C 04/27/2020 2:09 PM

## 2020-05-04 NOTE — Telephone Encounter (Signed)
Left message for the patient to call back and speak to the on-call preop APP of the day 

## 2020-05-04 NOTE — Telephone Encounter (Signed)
Tom [name] is returning Hao's call.

## 2020-05-05 NOTE — Telephone Encounter (Signed)
   Primary Cardiologist: Mertie Moores, MD  Chart reviewed as part of pre-operative protocol coverage. Patient was contacted 05/05/2020 in reference to pre-operative risk assessment for pending surgery as outlined below.  KHALON CANSLER was last seen on 08/29/19 by Dr. Acie Fredrickson.  Since that day, DEITRICK FERRERI has done well from a cardiac standpoint. He is quite active, walking, gardening, and cutting down trees. He only notes shortness of breath with intense activity and that has not changed in recent months. He can easily complete 4 METs without anginal complaints.   Therefore, based on ACC/AHA guidelines, the patient would be at acceptable risk for the planned procedure without further cardiovascular testing.   The patient was advised that if he develops new symptoms prior to surgery to contact our office to arrange for a follow-up visit, and he verbalized understanding.  Per pharmacy recommendations, patient can hold xarelto 2 days prior to his upcoming EGD and should restart when cleared to do so by his gastroenterologist.   I will route this recommendation to the requesting party via Epic fax function and remove from pre-op pool. Please call with questions.  Abigail Butts, PA-C 05/05/2020, 4:10 PM

## 2020-05-06 ENCOUNTER — Other Ambulatory Visit (INDEPENDENT_AMBULATORY_CARE_PROVIDER_SITE_OTHER): Payer: Self-pay | Admitting: Gastroenterology

## 2020-05-06 NOTE — Telephone Encounter (Signed)
Will you do order Room? Dx? Thanks :)

## 2020-05-11 ENCOUNTER — Other Ambulatory Visit (INDEPENDENT_AMBULATORY_CARE_PROVIDER_SITE_OTHER): Payer: Self-pay | Admitting: *Deleted

## 2020-05-17 ENCOUNTER — Other Ambulatory Visit: Payer: Self-pay

## 2020-05-17 ENCOUNTER — Other Ambulatory Visit (HOSPITAL_COMMUNITY)
Admission: RE | Admit: 2020-05-17 | Discharge: 2020-05-17 | Disposition: A | Discharge status: Home / Self Care | Payer: Medicare Other | Source: Ambulatory Visit | Attending: Gastroenterology | Admitting: Gastroenterology

## 2020-05-17 DIAGNOSIS — Z01812 Encounter for preprocedural laboratory examination: Secondary | ICD-10-CM | POA: Diagnosis not present

## 2020-05-17 DIAGNOSIS — Z20822 Contact with and (suspected) exposure to covid-19: Secondary | ICD-10-CM | POA: Diagnosis not present

## 2020-05-17 LAB — SARS CORONAVIRUS 2 (TAT 6-24 HRS): SARS Coronavirus 2: NEGATIVE

## 2020-05-18 ENCOUNTER — Encounter (HOSPITAL_COMMUNITY): Payer: Self-pay | Admitting: Gastroenterology

## 2020-05-18 ENCOUNTER — Other Ambulatory Visit: Payer: Self-pay

## 2020-05-18 ENCOUNTER — Ambulatory Visit (HOSPITAL_COMMUNITY): Payer: Medicare Other | Admitting: Anesthesiology

## 2020-05-18 ENCOUNTER — Encounter (HOSPITAL_COMMUNITY)
Admission: RE | Disposition: A | Discharge status: Home / Self Care | Payer: Self-pay | Source: Home / Self Care | Attending: Gastroenterology

## 2020-05-18 ENCOUNTER — Ambulatory Visit (HOSPITAL_COMMUNITY)
Admission: RE | Admit: 2020-05-18 | Discharge: 2020-05-18 | Disposition: A | Discharge status: Home / Self Care | Payer: Medicare Other | Attending: Gastroenterology | Admitting: Gastroenterology

## 2020-05-18 DIAGNOSIS — Z8679 Personal history of other diseases of the circulatory system: Secondary | ICD-10-CM | POA: Insufficient documentation

## 2020-05-18 DIAGNOSIS — Z79899 Other long term (current) drug therapy: Secondary | ICD-10-CM | POA: Diagnosis not present

## 2020-05-18 DIAGNOSIS — I2510 Atherosclerotic heart disease of native coronary artery without angina pectoris: Secondary | ICD-10-CM | POA: Insufficient documentation

## 2020-05-18 DIAGNOSIS — Z85828 Personal history of other malignant neoplasm of skin: Secondary | ICD-10-CM | POA: Insufficient documentation

## 2020-05-18 DIAGNOSIS — K219 Gastro-esophageal reflux disease without esophagitis: Secondary | ICD-10-CM | POA: Diagnosis not present

## 2020-05-18 DIAGNOSIS — Z888 Allergy status to other drugs, medicaments and biological substances status: Secondary | ICD-10-CM | POA: Diagnosis not present

## 2020-05-18 DIAGNOSIS — R0789 Other chest pain: Secondary | ICD-10-CM

## 2020-05-18 DIAGNOSIS — E785 Hyperlipidemia, unspecified: Secondary | ICD-10-CM | POA: Insufficient documentation

## 2020-05-18 DIAGNOSIS — I252 Old myocardial infarction: Secondary | ICD-10-CM | POA: Insufficient documentation

## 2020-05-18 DIAGNOSIS — K3189 Other diseases of stomach and duodenum: Secondary | ICD-10-CM | POA: Diagnosis not present

## 2020-05-18 DIAGNOSIS — Z951 Presence of aortocoronary bypass graft: Secondary | ICD-10-CM | POA: Insufficient documentation

## 2020-05-18 DIAGNOSIS — I714 Abdominal aortic aneurysm, without rupture: Secondary | ICD-10-CM | POA: Insufficient documentation

## 2020-05-18 DIAGNOSIS — I4891 Unspecified atrial fibrillation: Secondary | ICD-10-CM | POA: Diagnosis not present

## 2020-05-18 DIAGNOSIS — F17200 Nicotine dependence, unspecified, uncomplicated: Secondary | ICD-10-CM | POA: Insufficient documentation

## 2020-05-18 DIAGNOSIS — Z7901 Long term (current) use of anticoagulants: Secondary | ICD-10-CM | POA: Diagnosis not present

## 2020-05-18 DIAGNOSIS — Z8249 Family history of ischemic heart disease and other diseases of the circulatory system: Secondary | ICD-10-CM | POA: Diagnosis not present

## 2020-05-18 DIAGNOSIS — I10 Essential (primary) hypertension: Secondary | ICD-10-CM | POA: Insufficient documentation

## 2020-05-18 DIAGNOSIS — E7800 Pure hypercholesterolemia, unspecified: Secondary | ICD-10-CM | POA: Diagnosis not present

## 2020-05-18 DIAGNOSIS — F419 Anxiety disorder, unspecified: Secondary | ICD-10-CM | POA: Insufficient documentation

## 2020-05-18 DIAGNOSIS — I480 Paroxysmal atrial fibrillation: Secondary | ICD-10-CM | POA: Diagnosis not present

## 2020-05-18 DIAGNOSIS — Z7982 Long term (current) use of aspirin: Secondary | ICD-10-CM | POA: Diagnosis not present

## 2020-05-18 DIAGNOSIS — Z885 Allergy status to narcotic agent status: Secondary | ICD-10-CM | POA: Diagnosis not present

## 2020-05-18 DIAGNOSIS — K317 Polyp of stomach and duodenum: Secondary | ICD-10-CM | POA: Diagnosis not present

## 2020-05-18 HISTORY — PX: POLYPECTOMY: SHX5525

## 2020-05-18 HISTORY — PX: BIOPSY: SHX5522

## 2020-05-18 HISTORY — PX: ESOPHAGOGASTRODUODENOSCOPY (EGD) WITH PROPOFOL: SHX5813

## 2020-05-18 SURGERY — ESOPHAGOGASTRODUODENOSCOPY (EGD) WITH PROPOFOL
Anesthesia: General

## 2020-05-18 MED ORDER — LACTATED RINGERS IV SOLN: INTRAVENOUS | Status: DC | PRN

## 2020-05-18 MED ORDER — PROPOFOL 500 MG/50ML IV EMUL
INTRAVENOUS | Status: DC | PRN
  Administered 2020-05-18: 100 ug/kg/min via INTRAVENOUS

## 2020-05-18 MED ORDER — EPHEDRINE SULFATE-NACL 50-0.9 MG/10ML-% IV SOSY
PREFILLED_SYRINGE | INTRAVENOUS | Status: DC | PRN
  Administered 2020-05-18 (×2): 5 mg via INTRAVENOUS

## 2020-05-18 MED ORDER — LIDOCAINE VISCOUS HCL 2 % MT SOLN
15.0000 mL | Freq: Once | OROMUCOSAL | Status: AC
  Administered 2020-05-18: 15 mL via OROMUCOSAL

## 2020-05-18 MED ORDER — LIDOCAINE VISCOUS HCL 2 % MT SOLN
OROMUCOSAL | Status: AC
  Filled 2020-05-18: qty 15

## 2020-05-18 MED ORDER — PROPOFOL 10 MG/ML IV BOLUS
INTRAVENOUS | Status: DC | PRN
  Administered 2020-05-18: 70 mg via INTRAVENOUS

## 2020-05-18 MED ORDER — LACTATED RINGERS IV SOLN: Freq: Once | INTRAVENOUS | Status: AC

## 2020-05-18 MED ORDER — LIDOCAINE HCL (CARDIAC) PF 100 MG/5ML IV SOSY
PREFILLED_SYRINGE | INTRAVENOUS | Status: DC | PRN
  Administered 2020-05-18: 100 mg via INTRAVENOUS

## 2020-05-18 NOTE — Anesthesia Preprocedure Evaluation (Addendum)
Anesthesia Evaluation  Patient identified by MRN, date of birth, ID band Patient awake    Reviewed: Allergy & Precautions, NPO status , Patient's Chart, lab work & pertinent test results, reviewed documented beta blocker date and time   History of Anesthesia Complications (+) Family history of anesthesia reaction  Airway Mallampati: I  TM Distance: >3 FB Neck ROM: Full    Dental  (+) Dental Advisory Given Crowns :   Pulmonary Current Smoker and Patient abstained from smoking.,    Pulmonary exam normal breath sounds clear to auscultation       Cardiovascular Exercise Tolerance: Good hypertension, Pt. on medications and Pt. on home beta blockers + CAD, + Past MI, + CABG and + Peripheral Vascular Disease (AAA repair - 2013)  Normal cardiovascular exam+ dysrhythmias Atrial Fibrillation  Rhythm:Regular Rate:Normal     Neuro/Psych Anxiety    GI/Hepatic Neg liver ROS, GERD  Medicated,  Endo/Other  negative endocrine ROS  Renal/GU negative Renal ROS     Musculoskeletal negative musculoskeletal ROS (+)   Abdominal   Peds  Hematology negative hematology ROS (+)   Anesthesia Other Findings   Reproductive/Obstetrics negative OB ROS                             Anesthesia Physical Anesthesia Plan  ASA: III  Anesthesia Plan: General   Post-op Pain Management:    Induction: Intravenous  PONV Risk Score and Plan: TIVA  Airway Management Planned: Nasal Cannula, Natural Airway and Simple Face Mask  Additional Equipment:   Intra-op Plan:   Post-operative Plan:   Informed Consent: I have reviewed the patients History and Physical, chart, labs and discussed the procedure including the risks, benefits and alternatives for the proposed anesthesia with the patient or authorized representative who has indicated his/her understanding and acceptance.     Dental advisory given  Plan Discussed  with: CRNA and Surgeon  Anesthesia Plan Comments:        Anesthesia Quick Evaluation

## 2020-05-18 NOTE — Op Note (Addendum)
[facility] Patient Name: Tom [name] Procedure Date: 05/18/2020 7:09 AM MRN: [id number] Date of Birth: 07/09/1951 Attending MD: Maylon Peppers ,  CSN: [id number] Age: 69 Admit Type: Outpatient Procedure:                Upper GI endoscopy Indications:              Retrosternal chest discomfort Providers:                Maylon Peppers, Jeanann Lewandowsky. Sharon Seller, RN, Casimer Bilis, Technician Referring MD:              Medicines:                Monitored Anesthesia Care Complications:            No immediate complications. Estimated Blood Loss:     Estimated blood loss: none. Procedure:                Pre-Anesthesia Assessment:                           - Prior to the procedure, a History and Physical                            was performed, and patient medications, allergies                            and sensitivities were reviewed. The patient's                            tolerance of previous anesthesia was reviewed.                           - The risks and benefits of the procedure and the                            sedation options and risks were discussed with the                            patient. All questions were answered and informed                            consent was obtained.                           - ASA Grade Assessment: II - A patient with mild                            systemic disease.                           After obtaining informed consent, the endoscope was                            passed under direct vision. Throughout the  procedure, the patient's blood pressure, pulse, and                            oxygen saturations were monitored continuously. The                            GIF-H190 ([id number]) was introduced through the                            mouth, and advanced to the second part of duodenum.                            The upper GI endoscopy was accomplished without                             difficulty. The patient tolerated the procedure                            well. Scope In: 11:28:42 AM Scope Out: 11:41:08 AM Total Procedure Duration: 0 hours 12 minutes 26 seconds  Findings:      The Z-line was regular and was found 45 cm from the incisors.      The examined esophagus was normal.      The entire examined stomach was normal.      A single 5 mm semi-sessile polyp with no bleeding was found in the first       portion of the duodenum. The polyp was removed with a cold snare.       Resection and retrieval were complete.      The examined duodenum was normal. Biopsies for histology were taken with       a cold forceps for evaluation of celiac disease. Impression:               - Z-line regular, 45 cm from the incisors.                           - Normal esophagus.                           - Normal stomach.                           - A single duodenal polyp. Resected and retrieved.                           - Normal examined duodenum. Biopsied. Moderate Sedation:      Per Anesthesia Care Recommendation:           - Discharge patient to home (ambulatory).                           - Resume previous diet.                           - Await pathology results.                           -  Implement diaphragmatic breathing and avoiding                            talking when eating.                           - Can stop Pepcid or pantoprazole if no improvement                            in symptoms.                           - Return to GI clinic in 2 months.                           - Restart Xarelto tomorrow. Procedure Code(s):        --- Professional ---                           [phone], GC, Esophagogastroduodenoscopy, flexible,                            transoral; with removal of tumor(s), polyp(s), or                            other lesion(s) by snare technique                           43239, 33, Esophagogastroduodenoscopy, flexible,                             transoral; with biopsy, single or multiple Diagnosis Code(s):        --- Professional ---                           K31.7, Polyp of stomach and duodenum CPT copyright 2019 American [employer] Association. All rights reserved. The codes documented in this report are preliminary and upon coder review may  be revised to meet current compliance requirements. Maylon Peppers, MD Maylon Peppers,  05/18/2020 11:48:27 AM This report has been signed electronically. Number of Addenda: 0

## 2020-05-18 NOTE — Anesthesia Postprocedure Evaluation (Signed)
Anesthesia Post Note  Patient: AMERICO VALLERY  Procedure(s) Performed: ESOPHAGOGASTRODUODENOSCOPY (EGD) WITH PROPOFOL (N/A ) POLYPECTOMY BIOPSY  Patient location during evaluation: Endoscopy Anesthesia Type: General Level of consciousness: awake and alert and oriented Pain management: pain level controlled Vital Signs Assessment: post-procedure vital signs reviewed and stable Respiratory status: spontaneous breathing, nonlabored ventilation and respiratory function stable Cardiovascular status: blood pressure returned to baseline Postop Assessment: no headache and no apparent nausea or vomiting Anesthetic complications: no   No complications documented.   Last Vitals:  Vitals:   05/18/20 1139 05/18/20 1149  BP: 122/72   Pulse: (!) 53   Resp: 15   Temp:  37.1 C  SpO2: 99%     Last Pain:  Vitals:   05/18/20 1149  TempSrc: Oral  PainSc:                  Orlie Dakin

## 2020-05-18 NOTE — Discharge Instructions (Signed)
You are being discharged to home.  Resume your previous diet.  We are waiting for your pathology results.  Implement diaphragmatic breathing and avoiding talking when eating. Return to your GI clinic in two months.  Can stop Pepcid or pantoprazole if no improvement in symptoms. Restart Xarelto tomorrow  PATIENT INSTRUCTIONS POST-ANESTHESIA  IMMEDIATELY FOLLOWING SURGERY:  Do not drive or operate machinery for the first twenty four hours after surgery.  Do not make any important decisions for twenty four hours after surgery or while taking narcotic pain medications or sedatives.  If you develop intractable nausea and vomiting or a severe headache please notify your doctor immediately.  FOLLOW-UP:  Please make an appointment with your surgeon as instructed. You do not need to follow up with anesthesia unless specifically instructed to do so.  WOUND CARE INSTRUCTIONS (if applicable):  Keep a dry clean dressing on the anesthesia/puncture wound site if there is drainage.  Once the wound has quit draining you may leave it open to air.  Generally you should leave the bandage intact for twenty four hours unless there is drainage.  If the epidural site drains for more than 36-48 hours please call the anesthesia department.  QUESTIONS?:  Please feel free to call your physician or the hospital operator if you have any questions, and they will be happy to assist you.      Upper Endoscopy, Adult, Care After This sheet gives you information about how to care for yourself after your procedure. Your health care provider may also give you more specific instructions. If you have problems or questions, contact your health care provider. What can I expect after the procedure? After the procedure, it is common to have:  A sore throat.  Mild stomach pain or discomfort.  Bloating.  Nausea. Follow these instructions at home:   Follow instructions from your health care provider about what to eat or drink  after your procedure.  Return to your normal activities as told by your health care provider. Ask your health care provider what activities are safe for you.  Take over-the-counter and prescription medicines only as told by your health care provider.  Do not drive for 24 hours if you were given a sedative during your procedure.  Keep all follow-up visits as told by your health care provider. This is important. Contact a health care provider if you have:  A sore throat that lasts longer than one day.  Trouble swallowing. Get help right away if:  You vomit blood or your vomit looks like coffee grounds.  You have: ? A fever. ? Bloody, black, or tarry stools. ? A severe sore throat or you cannot swallow. ? Difficulty breathing. ? Severe pain in your chest or abdomen. Summary  After the procedure, it is common to have a sore throat, mild stomach discomfort, bloating, and nausea.  Do not drive for 24 hours if you were given a sedative during the procedure.  Follow instructions from your health care provider about what to eat or drink after your procedure.  Return to your normal activities as told by your health care provider. This information is not intended to replace advice given to you by your health care provider. Make sure you discuss any questions you have with your health care provider. Document Revised: 01/29/2018 Document Reviewed: 01/07/2018 Elsevier Patient Education  [place].   Gastric Polyps A gastric polyp, also called a stomach polyp, is a growth on the lining of the stomach. Most polyps are  not dangerous, but some can be harmful because of their size, location, or type. Polyps that can become harmful include:  Large polyps. These can turn into sores (ulcers). Ulcers can lead to stomach bleeding.  Polyps that block food from moving from the stomach to the small intestine (gastric outlet obstruction).  A type of polyp called an adenoma. This type of  polyp can become cancerous. What are the causes? Gastric polyps form when the lining of the stomach gets inflamed or damaged. Stomach inflammation and damage may be caused by:  A long-lasting stomach condition, such as gastritis.  Certain medicines used to reduce stomach acid.  An inherited condition called familial adenomatous polyposis. What are the signs or symptoms? Usually, this condition does not cause any symptoms. If you do have symptoms, they may include:  Pain or tenderness in the abdomen.  Nausea.  Trouble eating or swallowing.  Blood in the stool.  Anemia. How is this diagnosed? Gastric polyps are diagnosed with:  A medical procedure called endoscopy.  A lab test in which a part of the polyp is examined. This test is done with a sample of polyp tissue (biopsy) taken during an endoscopy. How is this treated? Treatment depends on the type, location, and size of the polyps. Treatment may involve:  Having the polyps checked regularly with an endoscopy.  Having the polyps removed with an endoscopy. This may be done if the polyps are harmful or can become harmful. Removing a polyp often prevents problems from developing.  Having the polyps removed with a surgery called a partial gastrectomy. This may be done in rare cases to remove very large polyps.  Treating the underlying condition that caused the polyps. Follow these instructions at home:  Take over-the-counter and prescription medicines only as told by your health care provider.  Keep all follow-up visits as told by your health care provider. This is important. Contact a health care provider if:  You develop new symptoms.  Your symptoms get worse. Get help right away if:  You vomit blood.  You have severe abdominal pain.  You cannot eat or drink.  You have blood in your stool. This information is not intended to replace advice given to you by your health care provider. Make sure you discuss any  questions you have with your health care provider. Document Revised: 07/20/2017 Document Reviewed: 08/22/2015 Elsevier Patient Education  [place].    Gastritis, Adult  Gastritis is swelling (inflammation) of the stomach. Gastritis can develop quickly (acute). It can also develop slowly over time (chronic). It is important to get help for this condition. If you do not get help, your stomach can bleed, and you can get sores (ulcers) in your stomach. What are the causes? This condition may be caused by:  Germs that get to your stomach.  Drinking too much alcohol.  Medicines you are taking.  Too much acid in the stomach.  A disease of the intestines or stomach.  Stress.  An allergic reaction.  Crohn's disease.  Some cancer treatments (radiation). Sometimes the cause of this condition is not known. What are the signs or symptoms? Symptoms of this condition include:  Pain in your stomach.  A burning feeling in your stomach.  Feeling sick to your stomach (nauseous).  Throwing up (vomiting).  Feeling too full after you eat.  Weight loss.  Bad breath.  Throwing up blood.  Blood in your poop (stool). How is this diagnosed? This condition may be diagnosed with:  Your medical history and symptoms.  A physical exam.  Tests. These can include: ? Blood tests. ? Stool tests. ? A procedure to look inside your stomach (upper endoscopy). ? A test in which a sample of tissue is taken for testing (biopsy). How is this treated? Treatment for this condition depends on what caused it. You may be given:  Antibiotic medicine, if your condition was caused by germs.  H2 blockers and similar medicines, if your condition was caused by too much acid. Follow these instructions at home: Medicines  Take over-the-counter and prescription medicines only as told by your doctor.  If you were prescribed an antibiotic medicine, take it as told by your doctor. Do not stop  taking it even if you start to feel better. Eating and drinking   Eat small meals often, instead of large meals.  Avoid foods and drinks that make your symptoms worse.  Drink enough fluid to keep your pee (urine) pale yellow. Alcohol use  Do not drink alcohol if: ? Your doctor tells you not to drink. ? You are pregnant, may be pregnant, or are planning to become pregnant.  If you drink alcohol: ? Limit your use to:  0-1 drink a day for women.  0-2 drinks a day for men. ? Be aware of how much alcohol is in your drink. In the [place] one drink equals one 12 oz bottle of beer (355 mL), one 5 oz glass of wine (148 mL), or one 1 oz glass of hard liquor (44 mL). General instructions  Talk with your doctor about ways to manage stress. You can exercise or do deep breathing, meditation, or yoga.  Do not smoke or use products that have nicotine or tobacco. If you need help quitting, ask your doctor.  Keep all follow-up visits as told by your doctor. This is important. Contact a doctor if:  Your symptoms get worse.  Your symptoms go away and then come back. Get help right away if:  You throw up blood or something that looks like coffee grounds.  You have black or dark red poop.  You throw up any time you try to drink fluids.  Your stomach pain gets worse.  You have a fever.  You do not feel better after one week. Summary  Gastritis is swelling (inflammation) of the stomach.  You must get help for this condition. If you do not get help, your stomach can bleed, and you can get sores (ulcers).  This condition is diagnosed with medical history, physical exam, or tests.  You can be treated with medicines for germs or medicines to block too much acid in your stomach. This information is not intended to replace advice given to you by your health care provider. Make sure you discuss any questions you have with your health care provider. Document Revised: 12/25/2017 Document  Reviewed: 12/25/2017 Elsevier Patient Education  [place].

## 2020-05-18 NOTE — Anesthesia Procedure Notes (Signed)
Date/Time: 05/18/2020 11:32 AM Performed by: Orlie Dakin, CRNA Pre-anesthesia Checklist: Patient identified, Emergency Drugs available, Suction available and Patient being monitored Patient Re-evaluated:Patient Re-evaluated prior to induction Oxygen Delivery Method: Nasal cannula Induction Type: IV induction Placement Confirmation: positive ETCO2

## 2020-05-18 NOTE — Transfer of Care (Signed)
Immediate Anesthesia Transfer of Care Note  Patient: Tom [name]  Procedure(s) Performed: ESOPHAGOGASTRODUODENOSCOPY (EGD) WITH PROPOFOL (N/A ) POLYPECTOMY BIOPSY  Patient Location: Endoscopy Unit2  Anesthesia Type:General  Level of Consciousness: drowsy  Airway & Oxygen Therapy: Patient Spontanous Breathing  Post-op Assessment: Report given to RN and Post -op Vital signs reviewed and stable  Post vital signs: Reviewed and stable  Last Vitals:  Vitals Value Taken Time  BP    Temp    Pulse    Resp    SpO2      Last Pain:  Vitals:   05/18/20 1126  TempSrc:   PainSc: 0-No pain      Patients Stated Pain Goal: 5 (28/11/88 6773)  Complications: No complications documented.

## 2020-05-18 NOTE — H&P (Signed)
Tom [name] is an 69 y.o. male.   Chief Complaint: Chest tightness/discomfort HPI: Tom [name] is a 69 y.o. male with past medical history of AAA status post stent placement, atrial fibrillation on Xarelto, peripheral artery disease, coronary artery disease status post CABG, skin cancer, GERD with prior history of Barrett's esophagus, myocardial infarction, hypertension, hyperlipidemia, who comes to the hospital for evaluation of retrosternal chest discomfort.  Patient has presented intermittent episodes of chest tightness after meals, usually 30 minutes after eating especially during the night for the last 1.5 years.  He reports having discomfort but no presence of nausea or vomiting.  He reports having a sensation of "gas feeling trapped" in his chest usually when eating onions or peppers.  However these may also happen with other kind of meals.  He has tried Gas-X, Tums, Pepcid and Protonix without any relief.  Never had an EGD in the past.  Patient was evaluated by his cardiologist recently who cleared him to undergo his endoscopy as he is not considered it was related to a cardiac etiology.  Past Medical History:  Diagnosis Date  . AAA (abdominal aortic aneurysm) ([place])   . Anxiety   . Anxiety   . Atrial fibrillation ([place]) 07/11/2013  . CAD (coronary artery disease) 01/27/2011  . Cancer [facility]) 2013   skin cancer,   MOHS surgery  . Chronic pain of left knee 02/05/2018  . Coronary artery disease   . Dental bridge present    upper  . Dental crowns present   . Diarrhea 06/17/2010   Qualifier: Diagnosis of  By: Gala Romney MD, Quentin Ore, R. Legrand Como    . Family history of adverse reaction to anesthesia    pt's mother has hx. of post-op N/V  . Foreign body of finger of left hand 12/2014   index finger  . GERD (gastroesophageal reflux disease)   . Hypercholesteremia   . Hyperlipidemia   . Hypertension    states under control with med., has been on med. x 20 yr.  . Hyponatremia 10/10/2018  .  Myocardial infarction ([place]) 1997  . PAF (paroxysmal atrial fibrillation) ([place])   . Syncope 10/10/2018  . Tobacco abuse 01/27/2011  . Wears glasses     Past Surgical History:  Procedure Laterality Date  . ABDOMINAL AORTIC ANEURYSM REPAIR  06/12/2012  . CARDIAC CATHETERIZATION  10/03/2005  . COLONOSCOPY    . CORONARY ARTERY BYPASS GRAFT  10/16/2005   LIMA to LAD saphenous vein to RCA  . FOREIGN BODY REMOVAL Left 01/08/2015   Procedure: EXCISION FOREIGN BODY ;  Surgeon: Daryll Brod, MD;  Location: [place];  Service: Orthopedics;  Laterality: Left;  ANESTHESIA:  GENERAL, IV REGIONAL UPPER ARM  . I & D EXTREMITY Left 11/12/2014   Procedure: IRRIGATION AND DEBRIDEMENT LEFT INDEX FINGER;  Surgeon: Daryll Brod, MD;  Location: [place];  Service: Orthopedics;  Laterality: Left;  . INCISION AND DRAINAGE ABSCESS Left 12/22/2014   Procedure: INCISION AND DRAINAGE ABSCESS LEFT INDEX FINGER;  Surgeon: Daryll Brod, MD;  Location: [place];  Service: Orthopedics;  Laterality: Left;  . PERCUTANEOUS STENT INTERVENTION Left 08/09/2012   Procedure: PERCUTANEOUS STENT INTERVENTION;  Surgeon: Elam Dutch, MD;  Location[facility] CATH LAB;  Service: Cardiovascular;  Laterality: Left;  stent to lt renal artery  . RENAL ANGIOGRAM N/A 08/09/2012   Procedure: RENAL ANGIOGRAM;  Surgeon: Elam Dutch, MD;  Location: [facility] CATH LAB;  Service: Cardiovascular;  Laterality: N/A;  Family History  Problem Relation Age of Onset  . Heart disease Father        before age 33  . Hypertension Father   . Hyperlipidemia Father   . Diabetes Father   . Heart attack Father   . Heart disease Mother   . Hypertension Mother   . Hyperlipidemia Mother   . Anesthesia problems Mother        hx. of post-op N/V  . AAA (abdominal aortic aneurysm) Maternal Uncle   . Cancer Maternal Uncle   . Hyperlipidemia Sister    Social History:  reports that he has been smoking cigarettes. He has a  22.00 pack-year smoking history. He has never used smokeless tobacco. He reports that he does not drink alcohol and does not use drugs.  Allergies:  Allergies  Allergen Reactions  . Niacin And Related Hives  . Codeine Other (See Comments)    GI upset ONLY in the cough syrup     Medications Prior to Admission  Medication Sig Dispense Refill  . acetaminophen (TYLENOL) 500 MG tablet Take 500-1,000 mg by mouth every 6 (six) hours as needed (for pain.).    [name] Kitchen alprazolam (XANAX) 2 MG tablet Take 2 mg by mouth at bedtime.    [name] Kitchen aspirin EC 81 MG tablet Take 81 mg by mouth daily after supper. Swallow whole.    [name] Kitchen atorvastatin (LIPITOR) 40 MG tablet Take 1 tablet (40 mg total) by mouth daily. (Patient taking differently: Take 40 mg by mouth daily in the afternoon. ) 90 tablet 3  . cholecalciferol (VITAMIN D) 25 MCG (1000 UNIT) tablet Take 2,000 Units by mouth daily.    . diphenhydrAMINE (BENADRYL) 25 MG tablet Take 25 mg by mouth every 6 (six) hours as needed for allergies.    [name] Kitchen dronedarone (MULTAQ) 400 MG tablet Take 1 tablet (400 mg total) by mouth daily. 90 tablet 2  . famotidine (PEPCID) 20 MG tablet Take 20 mg by mouth at bedtime.    . metoprolol succinate (TOPROL-XL) 50 MG 24 hr tablet TAKE 1 TABLET EVERY DAY  WITH  OR  IMMEDIATELY  FOLLOWING A MEAL (Patient taking differently: Take 50 mg by mouth daily with breakfast. ) 90 tablet 0  . olmesartan (BENICAR) 20 MG tablet Take 20 mg by mouth daily.     . pantoprazole (PROTONIX) 40 MG tablet Take 40 mg by mouth 2 (two) times daily before a meal. Before breakfast & before supper    . SODIUM FLUORIDE 5000 PPM 1.1 % PSTE Place 1 application onto teeth at bedtime.     Alveda Reasons 20 MG TABS tablet TAKE 1 TABLET  DAILY WITH SUPPER. (Patient taking differently: Take 20 mg by mouth daily after supper. ) 90 tablet 1  . Zinc 50 MG CAPS Take 50 mg by mouth daily.      Results for orders placed or performed during the hospital encounter of 05/17/20 (from the  past 48 hour(s))  SARS CORONAVIRUS 2 (TAT 6-24 HRS) Nasopharyngeal Nasopharyngeal Swab     Status: None   Collection Time: 05/17/20  8:25 AM   Specimen: Nasopharyngeal Swab  Result Value Ref Range   SARS Coronavirus 2 NEGATIVE NEGATIVE    Comment: (NOTE) SARS-CoV-2 target nucleic acids are NOT DETECTED.  The SARS-CoV-2 RNA is generally detectable in upper and lower respiratory specimens during the acute phase of infection. Negative results do not preclude SARS-CoV-2 infection, do not rule out co-infections with other pathogens, and should not be used as the  sole basis for treatment or other patient management decisions. Negative results must be combined with clinical observations, patient history, and epidemiological information. The expected result is Negative.  Fact Sheet for Patients: SugarRoll.be  Fact Sheet for Healthcare Providers: https://www.woods-mathews.com/  This test is not yet approved or cleared by the [place] FDA and  has been authorized for detection and/or diagnosis of SARS-CoV-2 by FDA under an Emergency Use Authorization (EUA). This EUA will remain  in effect (meaning this test can be used) for the duration of the COVID-19 declaration under Se ction 564(b)(1) of the Act, 21 U.S.C. section 360bbb-3(b)(1), unless the authorization is terminated or revoked sooner.  Performed at [facility], [place] [address], [place], [place] [address]    No results found.  Review of Systems  Constitutional: Negative.   HENT: Negative.   Eyes: Negative.   Respiratory: Positive for chest tightness.   Cardiovascular: Positive for chest pain.  Gastrointestinal: Negative.   Endocrine: Negative.   Genitourinary: Negative.   Musculoskeletal: Negative.   Skin: Negative.   Allergic/Immunologic: Negative.   Neurological: Negative.   Hematological: Negative.   Psychiatric/Behavioral: Negative.     Blood pressure (!) 175/86,  temperature 98.7 F (37.1 C), temperature source Oral, resp. rate 13, height 5\' 10"  (1.778 m), weight 74.8 kg, SpO2 99 %. Physical Exam  GENERAL: The patient is AO x3, in no acute distress. HEENT: Head is normocephalic and atraumatic. EOMI are intact. Mouth is well hydrated and without lesions. NECK: Supple. No masses LUNGS: Clear to auscultation. No presence of rhonchi/wheezing/rales. Adequate chest expansion HEART: RRR, normal s1 and s2. ABDOMEN: Soft, nontender, no guarding, no peritoneal signs, and nondistended. BS +. No masses. EXTREMITIES: Without any cyanosis, clubbing, rash, lesions or edema. NEUROLOGIC: AOx3, no focal motor deficit. SKIN: no jaundice, no rashes  Assessment/Plan ALEXA GOLEBIEWSKI is a 69 y.o. male with past medical history of AAA status post stent placement, atrial fibrillation on Xarelto, peripheral artery disease, coronary artery disease status post CABG, skin cancer, GERD with prior history of Barrett's esophagus, myocardial infarction, hypertension, hyperlipidemia, who comes to the hospital for evaluation of retrosternal chest discomfort.  Symptoms seem to be related to aerophagia/rumination.  We will proceed with EGD today to rule out organic etiologies.  Harvel Quale, MD 05/18/2020, 11:17 AM

## 2020-05-20 LAB — SURGICAL PATHOLOGY

## 2020-05-24 ENCOUNTER — Encounter (HOSPITAL_COMMUNITY): Payer: Self-pay | Admitting: Gastroenterology

## 2020-06-09 DIAGNOSIS — H2513 Age-related nuclear cataract, bilateral: Secondary | ICD-10-CM | POA: Diagnosis not present

## 2020-06-09 DIAGNOSIS — D3131 Benign neoplasm of right choroid: Secondary | ICD-10-CM | POA: Diagnosis not present

## 2020-06-09 DIAGNOSIS — H5203 Hypermetropia, bilateral: Secondary | ICD-10-CM | POA: Diagnosis not present

## 2020-06-23 ENCOUNTER — Other Ambulatory Visit: Payer: Self-pay | Admitting: Cardiovascular Disease

## 2020-07-19 ENCOUNTER — Other Ambulatory Visit: Payer: Self-pay

## 2020-07-19 ENCOUNTER — Encounter (INDEPENDENT_AMBULATORY_CARE_PROVIDER_SITE_OTHER): Payer: Self-pay | Admitting: Gastroenterology

## 2020-07-19 ENCOUNTER — Ambulatory Visit (INDEPENDENT_AMBULATORY_CARE_PROVIDER_SITE_OTHER): Payer: Medicare Other | Admitting: Gastroenterology

## 2020-07-19 VITALS — BP 170/83 | HR 62 | Temp 97.4°F | Ht 70.0 in | Wt 175.0 lb

## 2020-07-19 DIAGNOSIS — R072 Precordial pain: Secondary | ICD-10-CM

## 2020-07-19 NOTE — Progress Notes (Signed)
Tom [name], M.D. Gastroenterology & Hepatology [facility] [address] [place], [place] [address]  Primary Care Physician: Asencion Noble, MD [address] [place] [address]  I will communicate my assessment and recommendations to the referring MD via EMR. "Note: Occasional unusual wording and randomly placed punctuation marks may result from the use of speech recognition technology to transcribe this document"  Problems: 1. Retrosternal discomfort  History of Present Illness: Tom [name] is a 69 y.o. male with past medical history of AAA status post stent placement, atrial fibrillation on Xarelto, peripheral artery disease, coronary artery disease status post CABG, skin cancer, myocardial infarction, hypertension, who presents for follow up of retrosternal discomfort.  The patient was last seen on 04/22/2020. At that time, the patient was ordered to continue on PPI and anti-H2 medication.  Also was counseled to implement diaphragmatic breathing and to avoid talking when eating.  He obtained clearance by his cardiologist to undergo endoscopic procedure, EGD was performed on 05/18/2020 which showed normal esophagus and stomach with a duodenal polyp that was removed with a cold snare, pathology was positive for duodenal mucosa with reactive and hyperplastic changes but no dysplasia.  Patient reports he has been feeling better since the last time he was seen in clinic, he states that he symptoms are 70% better. States that occasionally he presents episodes of "fullness" in his retrosternal area. Has noticed this happens specially when he eats [name]. Has tried to avoid talking when eating and decreasing the size of his portions which has led to improvement of his symptoms. He states one of his episodes resolved when drinking Coke as it had gas bubbles.  States he has had to take Tums maybe 5 times during the last 3 months.  He  reports that he has not taken Protonix recently as he felt it never helped for his symptoms.  He has not presented any GERD.  The patient denies having any nausea, vomiting, fever, chills, hematochezia, melena, hematemesis, abdominal distention, abdominal pain, diarrhea, jaundice, pruritus or weight loss.  Last Colonoscopy: 13  Years ago - normal  Past Medical History: Past Medical History:  Diagnosis Date  . AAA (abdominal aortic aneurysm) ([place])   . Anxiety   . Anxiety   . Atrial fibrillation ([place]) 07/11/2013  . CAD (coronary artery disease) 01/27/2011  . Cancer [facility]) 2013   skin cancer,   MOHS surgery  . Chronic pain of left knee 02/05/2018  . Coronary artery disease   . Dental bridge present    upper  . Dental crowns present   . Diarrhea 06/17/2010   Qualifier: Diagnosis of  By: Gala Romney MD, Quentin Ore, R. Legrand Como    . Family history of adverse reaction to anesthesia    pt's mother has hx. of post-op N/V  . Foreign body of finger of left hand 12/2014   index finger  . GERD (gastroesophageal reflux disease)   . Hypercholesteremia   . Hyperlipidemia   . Hypertension    states under control with med., has been on med. x 20 yr.  . Hyponatremia 10/10/2018  . Myocardial infarction ([place]) 1997  . PAF (paroxysmal atrial fibrillation) ([place])   . Syncope 10/10/2018  . Tobacco abuse 01/27/2011  . Wears glasses     Past Surgical History: Past Surgical History:  Procedure Laterality Date  . ABDOMINAL AORTIC ANEURYSM REPAIR  06/12/2012  . BIOPSY  05/18/2020   Procedure: BIOPSY;  Surgeon: Harvel Quale, MD;  [facility];  Service: Gastroenterology;;  duodenum  . CARDIAC CATHETERIZATION  10/03/2005  . COLONOSCOPY    . CORONARY ARTERY BYPASS GRAFT  10/16/2005   LIMA to LAD saphenous vein to RCA  . ESOPHAGOGASTRODUODENOSCOPY (EGD) WITH PROPOFOL N/A 05/18/2020   Procedure: ESOPHAGOGASTRODUODENOSCOPY (EGD) WITH PROPOFOL;  Surgeon: Harvel Quale, MD;  [facility];  Service: Gastroenterology;  Laterality: N/A;  1100  . FOREIGN BODY REMOVAL Left 01/08/2015   Procedure: EXCISION FOREIGN BODY ;  Surgeon: Daryll Brod, MD;  Location: [place];  Service: Orthopedics;  Laterality: Left;  ANESTHESIA:  GENERAL, IV REGIONAL UPPER ARM  . I & D EXTREMITY Left 11/12/2014   Procedure: IRRIGATION AND DEBRIDEMENT LEFT INDEX FINGER;  Surgeon: Daryll Brod, MD;  Location: [place];  Service: Orthopedics;  Laterality: Left;  . INCISION AND DRAINAGE ABSCESS Left 12/22/2014   Procedure: INCISION AND DRAINAGE ABSCESS LEFT INDEX FINGER;  Surgeon: Daryll Brod, MD;  Location: [place];  Service: Orthopedics;  Laterality: Left;  . PERCUTANEOUS STENT INTERVENTION Left 08/09/2012   Procedure: PERCUTANEOUS STENT INTERVENTION;  Surgeon: Elam Dutch, MD;  Location: [facility] C[facility] LAB;  Service: Cardiovascular;  Laterality: Left;  stent to lt renal artery  . POLYPECTOMY  05/18/2020   Procedure: POLYPECTOMY;  Surgeon: Harvel Quale, MD;  [facility];  Service: Gastroenterology;;  small bowel  . RENAL ANGIOGRAM N/A 08/09/2012   Procedure: RENAL ANGIOGRAM;  Surgeon: Elam Dutch, MD;  Location: [facility] CATH LAB;  Service: Cardiovascular;  Laterality: N/A;    Family History: Family History  Problem Relation Age of Onset  . Heart disease Father        before age 89  . Hypertension Father   . Hyperlipidemia Father   . Diabetes Father   . Heart attack Father   . Heart disease Mother   . Hypertension Mother   . Hyperlipidemia Mother   . Anesthesia problems Mother        hx. of post-op N/V  . AAA (abdominal aortic aneurysm) Maternal Uncle   . Cancer Maternal Uncle   . Hyperlipidemia Sister     Social History: Social History   Tobacco Use  Smoking Status Current Every Day Smoker  . Packs/day: 0.50  . Years: 44.00  . Pack years: 22.00  . Types: Cigarettes  Smokeless Tobacco Never Used   Social History    Substance and Sexual Activity  Alcohol Use No  . Alcohol/week: 0.0 standard drinks   Social History   Substance and Sexual Activity  Drug Use No    Allergies: Allergies  Allergen Reactions  . Niacin And Related Hives  . Codeine Other (See Comments)    GI upset ONLY in the cough syrup     Medications: Current Outpatient Medications  Medication Sig Dispense Refill  . acetaminophen (TYLENOL) 500 MG tablet Take 500-1,000 mg by mouth every 6 (six) hours as needed (for pain.).    [name] Kitchen alprazolam (XANAX) 2 MG tablet Take 2 mg by mouth at bedtime.    [name] Kitchen aspirin EC 81 MG tablet Take 81 mg by mouth daily after supper. Swallow whole.    [name] Kitchen atorvastatin (LIPITOR) 40 MG tablet Take 1 tablet (40 mg total) by mouth daily. (Patient taking differently: Take 40 mg by mouth daily in the afternoon. ) 90 tablet 3  . cholecalciferol (VITAMIN D) 25 MCG (1000 UNIT) tablet Take 2,000 Units by mouth daily.    . diphenhydrAMINE (BENADRYL)  25 MG tablet Take 25 mg by mouth every 6 (six) hours as needed for allergies.    . famotidine (PEPCID) 20 MG tablet Take 20 mg by mouth at bedtime.    . metoprolol succinate (TOPROL-XL) 50 MG 24 hr tablet TAKE 1 TABLET EVERY DAY  WITH  OR  IMMEDIATELY  FOLLOWING A MEAL (Patient taking differently: Take 50 mg by mouth daily with breakfast. ) 90 tablet 0  . MULTAQ 400 MG tablet TAKE 1 TABLET BY MOUTH DAILY. PLEASE KEEP UPCOMING APPT IN NOVEMBER WITH DR. Acie Fredrickson FOR FUTURE REFILLS. THANK YOU 90 tablet 1  . olmesartan (BENICAR) 20 MG tablet Take 20 mg by mouth daily.     . pantoprazole (PROTONIX) 40 MG tablet Take 40 mg by mouth 2 (two) times daily before a meal. Before breakfast & before supper    . SODIUM FLUORIDE 5000 PPM 1.1 % PSTE Place 1 application onto teeth at bedtime.     Alveda Reasons 20 MG TABS tablet TAKE 1 TABLET  DAILY WITH SUPPER. (Patient taking differently: Take 20 mg by mouth daily after supper. ) 90 tablet 1  . Zinc 50 MG CAPS Take 50 mg by mouth daily.     No  current facility-administered medications for this visit.    Review of Systems: GENERAL: negative for malaise, night sweats HEENT: No changes in hearing or vision, no nose bleeds or other nasal problems. NECK: Negative for lumps, goiter, pain and significant neck swelling RESPIRATORY: Negative for cough, wheezing CARDIOVASCULAR: Negative for chest pain, leg swelling, palpitations, orthopnea GI: SEE HPI MUSCULOSKELETAL: Negative for joint pain or swelling, back pain, and muscle pain. SKIN: Negative for lesions, rash PSYCH: Negative for sleep disturbance, mood disorder and recent psychosocial stressors. HEMATOLOGY Negative for prolonged bleeding, bruising easily, and swollen nodes. ENDOCRINE: Negative for cold or heat intolerance, polyuria, polydipsia and goiter. NEURO: negative for tremor, gait imbalance, syncope and seizures. The remainder of the review of systems is noncontributory.   Physical Exam: BP (!) 170/83 (BP Location: Left Arm, Patient Position: Sitting, Cuff Size: Large)   Pulse 62   Temp (!) 97.4 F (36.3 C) (Oral)   Ht 5\' 10"  (1.778 m)   Wt 175 lb (79.4 kg)   BMI 25.11 kg/m  GENERAL: The patient is AO x3, in no acute distress. HEENT: Head is normocephalic and atraumatic. EOMI are intact. Mouth is well hydrated and without lesions. NECK: Supple. No masses LUNGS: Clear to auscultation. No presence of rhonchi/wheezing/rales. Adequate chest expansion HEART: RRR, normal s1 and s2. ABDOMEN: Soft, nontender, no guarding, no peritoneal signs, and nondistended. BS +. No masses. EXTREMITIES: Without any cyanosis, clubbing, rash, lesions or edema. NEUROLOGIC: AOx3, no focal motor deficit. SKIN: no jaundice, no rashes  Imaging/Labs: as above  I personally reviewed and interpreted the available labs, imaging and endoscopic files.  Impression and Plan: Tom [name] is a 69 y.o. male with past medical history of AAA status post stent placement, atrial fibrillation on  Xarelto, peripheral artery disease, coronary artery disease status post CABG, skin cancer, myocardial infarction, hypertension, who presents for follow up of retrosternal discomfort.  The patient has presented occasional episodes of retrosternal pain which I consider are related to some esophageal hypersensitivity, which is possibly related to the type of food that he eats.  His symptoms have much more improved with the implementation of dietary and lifestyle recommendations which I encouraged him to continue doing.  As he has not felt any improvement with the use of PPI  and he did not have any evidence of Barrett's or endoscopic alterations consistent with GERD, I agree that he can stop taking this medication.  Given the fact he has felt some improvement with the use of carbonated drinks, I advised him to take them if he is feeling persistent chest tightness, but I advised him to avoid soft drinks that may have sugar due to the effect this may have in his metabolism.  Finally, he will give [place] a call back around January 2022 to schedule his surveillance colonoscopy.  -Continue diaphragmatic breathing and avoiding talking when eating - Patient to take carbonated drinks (no soft drinks) when feeling the chest tightness or Tums as needed - Ok to stop PPI - Patient to call back in January to schedule screening colonoscopy  All questions were answered.      Harvel Quale, MD Gastroenterology and Hepatology [facility]

## 2020-07-19 NOTE — Patient Instructions (Addendum)
Take carbonated drinks (no soft drinks) when feeling the chest tightness or Tums as needed Call us back in January to schedule your screening colonoscopy

## 2020-07-23 DIAGNOSIS — I10 Essential (primary) hypertension: Secondary | ICD-10-CM | POA: Diagnosis not present

## 2020-07-23 DIAGNOSIS — F419 Anxiety disorder, unspecified: Secondary | ICD-10-CM | POA: Diagnosis not present

## 2020-07-23 DIAGNOSIS — Z23 Encounter for immunization: Secondary | ICD-10-CM | POA: Diagnosis not present

## 2020-09-07 ENCOUNTER — Other Ambulatory Visit: Payer: Self-pay | Admitting: Cardiovascular Disease

## 2020-09-08 DIAGNOSIS — C4441 Basal cell carcinoma of skin of scalp and neck: Secondary | ICD-10-CM | POA: Diagnosis not present

## 2020-09-08 DIAGNOSIS — D225 Melanocytic nevi of trunk: Secondary | ICD-10-CM | POA: Diagnosis not present

## 2020-09-08 DIAGNOSIS — Z85828 Personal history of other malignant neoplasm of skin: Secondary | ICD-10-CM | POA: Diagnosis not present

## 2020-09-08 DIAGNOSIS — C44319 Basal cell carcinoma of skin of other parts of face: Secondary | ICD-10-CM | POA: Diagnosis not present

## 2020-09-08 DIAGNOSIS — L821 Other seborrheic keratosis: Secondary | ICD-10-CM | POA: Diagnosis not present

## 2020-09-08 DIAGNOSIS — D485 Neoplasm of uncertain behavior of skin: Secondary | ICD-10-CM | POA: Diagnosis not present

## 2020-09-14 ENCOUNTER — Ambulatory Visit: Payer: Medicare Other | Admitting: Cardiovascular Disease

## 2020-09-20 ENCOUNTER — Encounter: Payer: Self-pay | Admitting: Cardiovascular Disease

## 2020-09-20 NOTE — Progress Notes (Signed)
Tom [name] Date of Birth  November 30, 1950 [facility] [address]    [place], [place]  [address] [phone]  Fax  [phone]  Problem list: 1. Coronary artery disease-status post CABG. February 2007. He had a LIMA  to the LAD and a SVG to the right coronary artery. 2. Hypertension 3. Hyperlipidemia 4. Renal insufficiency 5. Peripheral Vascular disease:  S/p AAA stent graft repair ( Oct. 2013)  s/p renal artery stenting  ( Dec. 2013) 6. Paroxysmal atrial fibrillation:      70 y.o. year-old gentleman with a history of coronary disease. He status post coronary artery bypass grafting in February 2007. He had a left internal mammary artery to the LAD and a saphenous vein graft to the right coronary artery. He also has a history of hypercholesterolemia, hypertension, and ongoing cigarette smoking. He's not had any episodes of chest pain, shortness breath, syncope, or presyncope.  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 He does not do any regular aerobic exercise. He's not had any recurrent episodes of chest pain. He tries to watch his salt intake.  He still smokes about a half a pack of cigarettes a day but has cut back from 2 packs a day. 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  November 22, 2012:  He has had an AAA stent graft repair and renal artery stenting since I last saw him.  He has had several episodes of chest pressure / tightness.  He found his BP and Hr  to be elevated.  He took and extra Toprol and ASA and felt better within an hour.  Oct. 24, 2014:  Tom [name] is having CP and palpitations .  Some relief with Inderal but occasionally has to take some NTG.  Symptoms seen to occur more at night - not during the day while he is working.  stil smoking 1/2 ppd.  Nov. 21, 2014:  Tom [name] is doing ok.  He was diagnosed with atrial fib by event monitor.  He has had several episodes of PAF - resolved after several  propranolol  Jan 02, 2014:  He is doing well.  Still have PAF - probably once a month.  Will last 4-6 hours.   He does not drink ETOH.   still smoking some .  Works as a Curator.   Oct. 24, 2016:  No CP ,  No recent episodes of PAF since starting the Multaq.  Was started by Dr. Lovena Le in Oct. 2015 Has not been taking his amlodipine due to leg swelling . Doing well.   BP is typcially ok  Getting some exercise Yard work  Retired from painting    Oct. 24, 2017:  Tom [name] is doing well.  Thinks he forgot his BP meds today .  Typical BP is well controlled.  Hs PAF - was started on Multaq.   Oct. 25, 2018:  Has lost 25 lbs. - purposeful weight loss Started cutting back on his BP meds Stopped Valsartan when it was recalled.  Has had orthostasis - once while using a chain saw. Several episodes of orthostasis getting up to the BR at night .   No CP , no dyspnea No syncope since adjusting his meds.  Cut his toprol XL to 50  Changed valsartan to Losartan  Has not had any a-fib since being on Multaq  Oct. 31, 2019:  Tom [name] is seen today for follow up of his CAD,  HTN,  HLD   and PAF .   Wt. Is 168-  Had an episode of leg swelling on 1 occasion. Is on Multaq - his primary MD gave him some HCTZ to take on occasion .  Seems to be related to being on his feet.  Takes the HCTZ maybe 1-2 times a week .  Thinks he may be able to get eliquis cheaper than Xarelto .  Has been taking the Multaq once a day due to cost  Encouraged him to see Dr. Lovena Le again if he wants to switch .   Jan. 8, 2021: Tom [name] is seen today for follow-up visit.  He has a history of coronary artery disease and is status post coronary artery bypass grafting.  He has a history of paroxysmal atrial fibrillation. No recent episodes of AF. Is active around the house - cuttng trees,  Also keeping up his mother house.   Feb. 1, 2022: Tom [name] is seen today for follow up of his CAD, PAF No exercise per se Walks,  hunts Doing some painting  Splits firewood  Still smoking ,  Ive recommended that he stop smoking   Current Outpatient Medications on File Prior to Visit  Medication Sig Dispense Refill  . acetaminophen (TYLENOL) 500 MG tablet Take 500-1,000 mg by mouth every 6 (six) hours as needed (for pain.).    [name] Kitchen alprazolam (XANAX) 2 MG tablet Take 2 mg by mouth at bedtime.    [name] Kitchen aspirin EC 81 MG tablet Take 81 mg by mouth daily after supper. Swallow whole.    [name] Kitchen atorvastatin (LIPITOR) 40 MG tablet Take 1 tablet (40 mg total) by mouth daily. 90 tablet 3  . cholecalciferol (VITAMIN D) 25 MCG (1000 UNIT) tablet Take 2,000 Units by mouth daily.    . diphenhydrAMINE (BENADRYL) 25 MG tablet Take 25 mg by mouth every 6 (six) hours as needed for allergies.    . famotidine (PEPCID) 20 MG tablet Take 20 mg by mouth as needed.    . metoprolol succinate (TOPROL-XL) 50 MG 24 hr tablet TAKE 1 TABLET EVERY DAY  WITH  OR  IMMEDIATELY  FOLLOWING A MEAL 90 tablet 0  . MULTAQ 400 MG tablet TAKE 1 TABLET BY MOUTH DAILY. PLEASE KEEP UPCOMING APPT IN NOVEMBER WITH DR. Acie Fredrickson FOR FUTURE REFILLS. THANK YOU 90 tablet 1  . olmesartan (BENICAR) 40 MG tablet Take 1 tablet by mouth daily.    . SODIUM FLUORIDE 5000 PPM 1.1 % PSTE Place 1 application onto teeth at bedtime.     Alveda Reasons 20 MG TABS tablet TAKE 1 TABLET  DAILY WITH SUPPER. 90 tablet 2  . Zinc 50 MG CAPS Take 50 mg by mouth daily.     No current facility-administered medications on file prior to visit.    Allergies  Allergen Reactions  . Niacin And Related Hives  . Codeine Other (See Comments)    GI upset ONLY in the cough syrup     Past Medical History:  Diagnosis Date  . AAA (abdominal aortic aneurysm) ([place])   . Anxiety   . Anxiety   . Atrial fibrillation ([place]) 07/11/2013  . CAD (coronary artery disease) 01/27/2011  . Cancer [facility]) 2013   skin cancer,   MOHS surgery  . Chronic pain of left knee 02/05/2018  . Coronary artery disease   . Dental bridge present     upper  . Dental crowns present   . Diarrhea 06/17/2010   Qualifier: Diagnosis of  By: Gala Romney MD,  FACP FACG, R. Legrand Como    . Family history of adverse reaction to anesthesia    pt's mother has hx. of post-op N/V  . Foreign body of finger of left hand 12/2014   index finger  . GERD (gastroesophageal reflux disease)   . Hypercholesteremia   . Hyperlipidemia   . Hypertension    states under control with med., has been on med. x 20 yr.  . Hyponatremia 10/10/2018  . Myocardial infarction ([place]) 1997  . PAF (paroxysmal atrial fibrillation) ([place])   . Syncope 10/10/2018  . Tobacco abuse 01/27/2011  . Wears glasses     Past Surgical History:  Procedure Laterality Date  . ABDOMINAL AORTIC ANEURYSM REPAIR  06/12/2012  . BIOPSY  05/18/2020   Procedure: BIOPSY;  Surgeon: Harvel Quale, MD;  [facility];  Service: Gastroenterology;;  duodenum  . CARDIAC CATHETERIZATION  10/03/2005  . COLONOSCOPY    . CORONARY ARTERY BYPASS GRAFT  10/16/2005   LIMA to LAD saphenous vein to RCA  . ESOPHAGOGASTRODUODENOSCOPY (EGD) WITH PROPOFOL N/A 05/18/2020   Procedure: ESOPHAGOGASTRODUODENOSCOPY (EGD) WITH PROPOFOL;  Surgeon: Harvel Quale, MD;  [facility];  Service: Gastroenterology;  Laterality: N/A;  1100  . FOREIGN BODY REMOVAL Left 01/08/2015   Procedure: EXCISION FOREIGN BODY ;  Surgeon: Daryll Brod, MD;  Location: [place];  Service: Orthopedics;  Laterality: Left;  ANESTHESIA:  GENERAL, IV REGIONAL UPPER ARM  . I & D EXTREMITY Left 11/12/2014   Procedure: IRRIGATION AND DEBRIDEMENT LEFT INDEX FINGER;  Surgeon: Daryll Brod, MD;  Location: [place];  Service: Orthopedics;  Laterality: Left;  . INCISION AND DRAINAGE ABSCESS Left 12/22/2014   Procedure: INCISION AND DRAINAGE ABSCESS LEFT INDEX FINGER;  Surgeon: Daryll Brod, MD;  Location: [place];  Service: Orthopedics;  Laterality: Left;  . PERCUTANEOUS STENT  INTERVENTION Left 08/09/2012   Procedure: PERCUTANEOUS STENT INTERVENTION;  Surgeon: Elam Dutch, MD;  Location: [facility] CATH LAB;  Service: Cardiovascular;  Laterality: Left;  stent to lt renal artery  . POLYPECTOMY  05/18/2020   Procedure: POLYPECTOMY;  Surgeon: Harvel Quale, MD;  [facility];  Service: Gastroenterology;;  small bowel  . RENAL ANGIOGRAM N/A 08/09/2012   Procedure: RENAL ANGIOGRAM;  Surgeon: Elam Dutch, MD;  Location: [facility] CATH LAB;  Service: Cardiovascular;  Laterality: N/A;    Social History   Tobacco Use  Smoking Status Current Every Day Smoker  . Packs/day: 0.50  . Years: 44.00  . Pack years: 22.00  . Types: Cigarettes  Smokeless Tobacco Never Used    Social History   Substance and Sexual Activity  Alcohol Use No  . Alcohol/week: 0.0 standard drinks    Family History  Problem Relation Age of Onset  . Heart disease Father        before age 37  . Hypertension Father   . Hyperlipidemia Father   . Diabetes Father   . Heart attack Father   . Heart disease Mother   . Hypertension Mother   . Hyperlipidemia Mother   . Anesthesia problems Mother        hx. of post-op N/V  . AAA (abdominal aortic aneurysm) Maternal Uncle   . Cancer Maternal Uncle   . Hyperlipidemia Sister     Reviw of Systems:  Reviewed in the HPI.  All other systems are negative.   Physical Exam: Blood pressure 116/72, pulse (!) 50, height 5\' 10"  (1.778 m), weight 172  lb (78 kg).  GEN:  Well nourished, well developed in no acute distress HEENT: Normal NECK: No JVD; No carotid bruits LYMPHATICS: No lymphadenopathy CARDIAC: RRR , no murmurs, rubs, gallops RESPIRATORY:  Clear to auscultation without rales, wheezing or rhonchi  ABDOMEN: Soft, non-tender, non-distended MUSCULOSKELETAL:  No edema; No deformity  SKIN: Warm and dry NEUROLOGIC:  Alert and oriented x 3   ECG:  Feb. 1, 2022:   Sinus brady at 50.  No ST or T wave change.s   Assessment /  Plan:   1. Coronary artery disease-status post CABG.    No angina .   Cont current meds.  DC ASA since he is on xarelto   2. Hypertension  :    BP is well controlled . Cont meds    3. Hyperlipidemia:    Lips look great from last year.  Check lipids, ALT, bmp today   4. Renal insufficiency = plans per primary md  5. Peripheral Vascular disease:      6. Paroxysmal atrial fibrillation:      He is on Multaq 400 mg once a day.  Cont xarelto DC ASA since he is on xarelto     Will see him in a year   Mertie Moores, MD  09/21/2020 8:17 AM    [facility] ,  [place], [place]  [address] Pager [phone] Phone: [phone]; Fax: [phone]

## 2020-09-21 ENCOUNTER — Other Ambulatory Visit: Payer: Self-pay

## 2020-09-21 ENCOUNTER — Encounter: Payer: Self-pay | Admitting: Cardiovascular Disease

## 2020-09-21 ENCOUNTER — Ambulatory Visit (INDEPENDENT_AMBULATORY_CARE_PROVIDER_SITE_OTHER): Payer: Medicare Other | Admitting: Cardiovascular Disease

## 2020-09-21 VITALS — BP 116/72 | HR 50 | Ht 70.0 in | Wt 172.0 lb

## 2020-09-21 DIAGNOSIS — I480 Paroxysmal atrial fibrillation: Secondary | ICD-10-CM | POA: Diagnosis not present

## 2020-09-21 DIAGNOSIS — E7800 Pure hypercholesterolemia, unspecified: Secondary | ICD-10-CM

## 2020-09-21 DIAGNOSIS — I2510 Atherosclerotic heart disease of native coronary artery without angina pectoris: Secondary | ICD-10-CM | POA: Diagnosis not present

## 2020-09-21 DIAGNOSIS — I10 Essential (primary) hypertension: Secondary | ICD-10-CM

## 2020-09-21 LAB — BASIC METABOLIC PANEL
BUN/Creatinine Ratio: 9 — ABNORMAL LOW (ref 10–24)
BUN: 12 mg/dL (ref 8–27)
CO2: 25 mmol/L (ref 20–29)
Calcium: 9.1 mg/dL (ref 8.6–10.2)
Chloride: 98 mmol/L (ref 96–106)
Creatinine, Ser: 1.32 mg/dL — ABNORMAL HIGH (ref 0.76–1.27)
GFR calc Af Amer: 64 mL/min/{1.73_m2} (ref >59)
GFR calc non Af Amer: 55 mL/min/{1.73_m2} — ABNORMAL LOW (ref >59)
Glucose: 92 mg/dL (ref 65–99)
Potassium: 4.7 mmol/L (ref 3.5–5.2)
Sodium: 133 mmol/L — ABNORMAL LOW (ref 134–144)

## 2020-09-21 LAB — ALT: ALT: 19 IU/L (ref 0–44)

## 2020-09-21 LAB — LIPID PANEL
Chol/HDL Ratio: 3.5 ratio (ref 0.0–5.0)
Cholesterol, Total: 124 mg/dL (ref 100–199)
HDL: 35 mg/dL — ABNORMAL LOW (ref >39)
LDL Chol Calc (NIH): 69 mg/dL (ref 0–99)
Triglycerides: 105 mg/dL (ref 0–149)
VLDL Cholesterol Cal: 20 mg/dL (ref 5–40)

## 2020-09-21 NOTE — Patient Instructions (Signed)
Medication Instructions:  Your physician has recommended you make the following change in your medication:   STOP TAKING ASPIRIN  *If you need a refill on your cardiac medications before your next appointment, please call your pharmacy*   Lab Work: Today (cholesterol, liver function, and basic metabolic panel)  If you have labs (blood work) drawn today and your tests are completely normal, you will receive your results only by: [name] Kitchen MyChart Message (if you have MyChart) OR . A paper copy in the mail If you have any lab test that is abnormal or we need to change your treatment, we will call you to review the results.   Testing/Procedures: none   Follow-Up: At [facility], you and your health needs are our priority.  As part of our continuing mission to provide you with exceptional heart care, we have created designated Provider Care Teams.  These Care Teams include your primary Cardiologist (physician) and Advanced Practice Providers (APPs -  Physician Assistants and Nurse Practitioners) who all work together to provide you with the care you need, when you need it.  Your next appointment:   1 year(s)  The format for your next appointment:   In Person  Provider:   You may see Mertie Moores, MD or one of the following Advanced Practice Providers on your designated Care Team:    Richardson Dopp, PA-C  [place], [place]

## 2020-09-23 DIAGNOSIS — I7100 Dissection of unspecified site of aorta: Secondary | ICD-10-CM | POA: Diagnosis not present

## 2020-09-23 DIAGNOSIS — I10 Essential (primary) hypertension: Secondary | ICD-10-CM | POA: Diagnosis not present

## 2020-10-19 ENCOUNTER — Ambulatory Visit (INDEPENDENT_AMBULATORY_CARE_PROVIDER_SITE_OTHER): Payer: Medicare Other | Admitting: Physician Assistant

## 2020-10-19 ENCOUNTER — Encounter: Payer: Self-pay | Admitting: Physician Assistant

## 2020-10-19 ENCOUNTER — Ambulatory Visit (INDEPENDENT_AMBULATORY_CARE_PROVIDER_SITE_OTHER)
Admission: RE | Admit: 2020-10-19 | Discharge: 2020-10-19 | Disposition: A | Discharge status: Home / Self Care | Payer: Medicare Other | Source: Ambulatory Visit | Attending: Vascular Surgery | Admitting: Vascular Surgery

## 2020-10-19 ENCOUNTER — Other Ambulatory Visit: Payer: Self-pay

## 2020-10-19 ENCOUNTER — Ambulatory Visit (HOSPITAL_COMMUNITY)
Admission: RE | Admit: 2020-10-19 | Discharge: 2020-10-19 | Disposition: A | Discharge status: Home / Self Care | Payer: Medicare Other | Source: Ambulatory Visit | Attending: Vascular Surgery | Admitting: Vascular Surgery

## 2020-10-19 VITALS — BP 139/76 | HR 52 | Temp 98.1°F | Resp 20 | Ht 70.0 in | Wt 173.4 lb

## 2020-10-19 DIAGNOSIS — I6523 Occlusion and stenosis of bilateral carotid arteries: Secondary | ICD-10-CM | POA: Insufficient documentation

## 2020-10-19 DIAGNOSIS — I714 Abdominal aortic aneurysm, without rupture, unspecified: Secondary | ICD-10-CM

## 2020-10-19 DIAGNOSIS — I701 Atherosclerosis of renal artery: Secondary | ICD-10-CM | POA: Insufficient documentation

## 2020-10-19 DIAGNOSIS — Z95828 Presence of other vascular implants and grafts: Secondary | ICD-10-CM | POA: Diagnosis not present

## 2020-10-19 NOTE — Progress Notes (Signed)
History of Present Illness:  Patient is a 70 y.o. year old male who presents for evaluation and  routine follow up s/p EVAR by Dr. Oneida Alar in 2013.  He denies new or changing abdominal or back pain.  Also denies any claudication, rest pain, or discoloration of bilateral lower extremities.  He is a current every day smoker.  He is on aspirin and statin daily.  He is also on Xarelto for atrial fibrillation.   He is also followed for renal artery stenosis having had a left renal artery stents by Dr. Oneida Alar also in 2013.  He is also followed for carotid artery stenosis.  He denies any history of TIA or CVA.  He denise amaurosis, aphasia and weakness.    Past Medical History:  Diagnosis Date  . AAA (abdominal aortic aneurysm) ([place])   . Anxiety   . Anxiety   . Atrial fibrillation ([place]) 07/11/2013  . CAD (coronary artery disease) 01/27/2011  . Cancer [facility]) 2013   skin cancer,   MOHS surgery  . Chronic pain of left knee 02/05/2018  . Coronary artery disease   . Dental bridge present    upper  . Dental crowns present   . Diarrhea 06/17/2010   Qualifier: Diagnosis of  By: Gala Romney MD, Quentin Ore, R. Legrand Como    . Family history of adverse reaction to anesthesia    pt's mother has hx. of post-op N/V  . Foreign body of finger of left hand 12/2014   index finger  . GERD (gastroesophageal reflux disease)   . Hypercholesteremia   . Hyperlipidemia   . Hypertension    states under control with med., has been on med. x 20 yr.  . Hyponatremia 10/10/2018  . Myocardial infarction ([place]) 1997  . PAF (paroxysmal atrial fibrillation) ([place])   . Syncope 10/10/2018  . Tobacco abuse 01/27/2011  . Wears glasses     Past Surgical History:  Procedure Laterality Date  . ABDOMINAL AORTIC ANEURYSM REPAIR  06/12/2012  . BIOPSY  05/18/2020   Procedure: BIOPSY;  Surgeon: Harvel Quale, MD;  [facility];  Service: Gastroenterology;;  duodenum  . CARDIAC CATHETERIZATION  10/03/2005  . COLONOSCOPY     . CORONARY ARTERY BYPASS GRAFT  10/16/2005   LIMA to LAD saphenous vein to RCA  . ESOPHAGOGASTRODUODENOSCOPY (EGD) WITH PROPOFOL N/A 05/18/2020   Procedure: ESOPHAGOGASTRODUODENOSCOPY (EGD) WITH PROPOFOL;  Surgeon: Harvel Quale, MD;  [facility];  Service: Gastroenterology;  Laterality: N/A;  1100  . FOREIGN BODY REMOVAL Left 01/08/2015   Procedure: EXCISION FOREIGN BODY ;  Surgeon: Daryll Brod, MD;  Location: [place];  Service: Orthopedics;  Laterality: Left;  ANESTHESIA:  GENERAL, IV REGIONAL UPPER ARM  . I & D EXTREMITY Left 11/12/2014   Procedure: IRRIGATION AND DEBRIDEMENT LEFT INDEX FINGER;  Surgeon: Daryll Brod, MD;  Location: [place];  Service: Orthopedics;  Laterality: Left;  . INCISION AND DRAINAGE ABSCESS Left 12/22/2014   Procedure: INCISION AND DRAINAGE ABSCESS LEFT INDEX FINGER;  Surgeon: Daryll Brod, MD;  Location: [place];  Service: Orthopedics;  Laterality: Left;  . PERCUTANEOUS STENT INTERVENTION Left 08/09/2012   Procedure: PERCUTANEOUS STENT INTERVENTION;  Surgeon: Elam Dutch, MD;  Location: [facility] CATH LAB;  Service: Cardiovascular;  Laterality: Left;  stent to lt renal artery  . POLYPECTOMY  05/18/2020   Procedure: POLYPECTOMY;  Surgeon: Harvel Quale, MD;  [facility];  Service: Gastroenterology;;  small bowel  . RENAL ANGIOGRAM N/A 08/09/2012   Procedure: RENAL ANGIOGRAM;  Surgeon: Elam Dutch, MD;  Location: [facility] CATH LAB;  Service: Cardiovascular;  Laterality: N/A;     Social History Social History   Tobacco Use  . Smoking status: Current Every Day Smoker    Packs/day: 0.50    Years: 44.00    Pack years: 22.00    Types: Cigarettes  . Smokeless tobacco: Never Used  Vaping Use  . Vaping Use: Never used  Substance Use Topics  . Alcohol use: No    Alcohol/week: 0.0 standard drinks  . Drug use: No    Family History Family History  Problem Relation Age of  Onset  . Heart disease Father        before age 19  . Hypertension Father   . Hyperlipidemia Father   . Diabetes Father   . Heart attack Father   . Heart disease Mother   . Hypertension Mother   . Hyperlipidemia Mother   . Anesthesia problems Mother        hx. of post-op N/V  . AAA (abdominal aortic aneurysm) Maternal Uncle   . Cancer Maternal Uncle   . Hyperlipidemia Sister     Allergies  Allergies  Allergen Reactions  . Niacin And Related Hives  . Codeine Other (See Comments)    GI upset ONLY in the cough syrup      Current Outpatient Medications  Medication Sig Dispense Refill  . acetaminophen (TYLENOL) 500 MG tablet Take 500-1,000 mg by mouth every 6 (six) hours as needed (for pain.).    [name] Kitchen alprazolam (XANAX) 2 MG tablet Take 2 mg by mouth at bedtime.    [name] Kitchen atorvastatin (LIPITOR) 40 MG tablet Take 1 tablet (40 mg total) by mouth daily. 90 tablet 3  . cholecalciferol (VITAMIN D) 25 MCG (1000 UNIT) tablet Take 2,000 Units by mouth daily.    . diphenhydrAMINE (BENADRYL) 25 MG tablet Take 25 mg by mouth every 6 (six) hours as needed for allergies.    . famotidine (PEPCID) 20 MG tablet Take 20 mg by mouth as needed.    . metoprolol succinate (TOPROL-XL) 50 MG 24 hr tablet TAKE 1 TABLET EVERY DAY  WITH  OR  IMMEDIATELY  FOLLOWING A MEAL 90 tablet 0  . MULTAQ 400 MG tablet TAKE 1 TABLET BY MOUTH DAILY. PLEASE KEEP UPCOMING APPT IN NOVEMBER WITH DR. Acie Fredrickson FOR FUTURE REFILLS. THANK YOU 90 tablet 1  . olmesartan (BENICAR) 40 MG tablet Take 1 tablet by mouth daily.    . SODIUM FLUORIDE 5000 PPM 1.1 % PSTE Place 1 application onto teeth at bedtime.     Alveda Reasons 20 MG TABS tablet TAKE 1 TABLET  DAILY WITH SUPPER. 90 tablet 2  . Zinc 50 MG CAPS Take 50 mg by mouth daily.     No current facility-administered medications for this visit.    ROS:   General:  No weight loss, Fever, chills  HEENT: No recent headaches, no nasal bleeding, no visual changes, no sore  throat  Neurologic: No dizziness, blackouts, seizures. No recent symptoms of stroke or mini- stroke. No recent episodes of slurred speech, or temporary blindness.  Cardiac: No recent episodes of chest pain/pressure, no shortness of breath at rest.  No shortness of breath with exertion.  Denies history of atrial fibrillation or irregular heartbeat  Vascular: No history of rest pain in feet.  No history of claudication.  No history of non-healing ulcer, No history  of DVT   Pulmonary: No home oxygen, no productive cough, no hemoptysis,  No asthma or wheezing  Musculoskeletal:  [ ]  Arthritis, [ ]  Low back pain,  [ ]  Joint pain  Hematologic:No history of hypercoagulable state.  No history of easy bleeding.  No history of anemia  Gastrointestinal: No hematochezia or melena,  No gastroesophageal reflux, no trouble swallowing  Urinary: [ ]  chronic Kidney disease, [ ]  on HD - [ ]  MWF or [ ]  TTHS, [ ]  Burning with urination, [ ]  Frequent urination, [ ]  Difficulty urinating;   Skin: No rashes  Psychological: No history of anxiety,  No history of depression   Physical Examination  Vitals:   10/19/20 0930  BP: 140/78  Pulse: (!) 52  Resp: 20  Temp: 98.1 F (36.7 C)  TempSrc: Temporal  SpO2: 97%  Weight: 173 lb 6.4 oz (78.7 kg)  Height: 5\' 10"  (1.778 m)    Body mass index is 24.88 kg/m.  General:  Alert and oriented, no acute distress HEENT: Normal Neck: No bruit or JVD Pulmonary: Clear to auscultation bilaterally Cardiac: Regular Rate and Rhythm without murmur Gastrointestinal: Soft, non-tender, non-distended, no mass, no scars Skin: No rash Extremity Pulses:  2+ radial, brachial, femoral, dorsalis pedis, posterior tibial pulses bilaterally Musculoskeletal: No deformity or edema  Neurologic: Upper and lower extremity motor 5/5 and symmetric  DATA:  EVAR Duplex   AAA sac size:3.49  cm  no endoleak detected  Renal Duplex  Patent left renal artery stent  Carotid  duplex  Right ICA < 39% stenosis  Left ICA 40-59% stenosis   ASSESSMENT/Plan:  70 y.o. male who presents for routine surveillance related to previous EVAR, left renal artery stent, and carotid artery stenosis  EVAR duplex shows no endo leak and has not changed in size since his last visit.     Renal duplex shows open stent without flow limitations   Carotid duplex demonstrates no change in left ICA stenosis and he remains asymptomatic  Plan will be to continue daily activities and follow up in 1 year for repeat studies.   He will cont to take Lipitor and he is on Xarelto for Afib.  He is still working on smoking cessation.       Roxy Horseman PA-C [facility] Office: [phone]  MD in clinic [place]

## 2020-10-30 DIAGNOSIS — Z1211 Encounter for screening for malignant neoplasm of colon: Secondary | ICD-10-CM | POA: Diagnosis not present

## 2020-10-30 DIAGNOSIS — Z1212 Encounter for screening for malignant neoplasm of rectum: Secondary | ICD-10-CM | POA: Diagnosis not present

## 2020-11-18 DIAGNOSIS — E785 Hyperlipidemia, unspecified: Secondary | ICD-10-CM | POA: Diagnosis not present

## 2020-11-18 DIAGNOSIS — I10 Essential (primary) hypertension: Secondary | ICD-10-CM | POA: Diagnosis not present

## 2020-12-01 ENCOUNTER — Encounter: Payer: Self-pay | Admitting: Gastroenterology

## 2020-12-17 ENCOUNTER — Encounter: Payer: Self-pay | Admitting: Gastroenterology

## 2020-12-17 ENCOUNTER — Ambulatory Visit (INDEPENDENT_AMBULATORY_CARE_PROVIDER_SITE_OTHER): Payer: Medicare Other | Admitting: Gastroenterology

## 2020-12-17 VITALS — BP 126/78 | HR 70 | Ht 70.0 in | Wt 174.8 lb

## 2020-12-17 DIAGNOSIS — R195 Other fecal abnormalities: Secondary | ICD-10-CM | POA: Insufficient documentation

## 2020-12-17 DIAGNOSIS — I701 Atherosclerosis of renal artery: Secondary | ICD-10-CM | POA: Diagnosis not present

## 2020-12-17 DIAGNOSIS — Z7901 Long term (current) use of anticoagulants: Secondary | ICD-10-CM

## 2020-12-17 MED ORDER — SUTAB 1479-225-188 MG PO TABS: 1.0000 | ORAL_TABLET | Freq: Once | ORAL | 0 refills | Status: AC

## 2020-12-17 NOTE — Progress Notes (Signed)
12/17/2020 EVENS MENO [id number] 05-Nov-1950   HISTORY OF PRESENT ILLNESS: This is a pleasant 70 year old male who is new to our practice.  He has been referred here by his PCP, Dr. Willey Blade, for for evaluation regarding a positive Cologuard study.  He tells me his last colonoscopy was about 13 years ago at [facility].  We do not have those records, but he tells me he does not recall any polyps being removed.  He does recall being told he had hemorrhoids and diverticulosis.  Nonetheless, he had a recent Cologuard study that was positive.  He tells me that he does not see blood in his stool.  He admits to occasional mild constipation for which he will drink a glass of prune juice.  He will get random intermittent left-sided abdominal discomfort at times that feels like gas is trapped.  He says that it is not often and it does not occur for months at a time.  He has intermittent issues with acid reflux for which he uses Pepcid, Rolaids, and other over-the-counter products as needed.  He recently had an EGD with Dr. Jenetta Downer in [place].  His wife is a patient here so he decided that he wanted to start following here and have his colonoscopy performed at our facility.  He is on Xarelto that is prescribed by Dr. Acie Fredrickson for history of PAF.  Past Medical History:  Diagnosis Date  . AAA (abdominal aortic aneurysm) ([place])   . Anxiety   . Anxiety   . Atrial fibrillation ([place]) 07/11/2013  . CAD (coronary artery disease) 01/27/2011  . Cancer [facility]) 2013   skin cancer,   MOHS surgery  . Chronic pain of left knee 02/05/2018  . Coronary artery disease   . Dental bridge present    upper  . Dental crowns present   . Diarrhea 06/17/2010   Qualifier: Diagnosis of  By: Gala Romney MD, Quentin Ore, R. Legrand Como    . Family history of adverse reaction to anesthesia    pt's mother has hx. of post-op N/V  . Foreign body of finger of left hand 12/2014   index finger  . GERD (gastroesophageal reflux disease)    . Hypercholesteremia   . Hyperlipidemia   . Hypertension    states under control with med., has been on med. x 20 yr.  . Hyponatremia 10/10/2018  . Myocardial infarction ([place]) 1997  . PAF (paroxysmal atrial fibrillation) ([place])   . Syncope 10/10/2018  . Tobacco abuse 01/27/2011  . Wears glasses    Past Surgical History:  Procedure Laterality Date  . ABDOMINAL AORTIC ANEURYSM REPAIR  06/12/2012  . BIOPSY  05/18/2020   Procedure: BIOPSY;  Surgeon: Harvel Quale, MD;  [facility];  Service: Gastroenterology;;  duodenum  . CARDIAC CATHETERIZATION  10/03/2005  . COLONOSCOPY    . CORONARY ARTERY BYPASS GRAFT  10/16/2005   LIMA to LAD saphenous vein to RCA  . ESOPHAGOGASTRODUODENOSCOPY (EGD) WITH PROPOFOL N/A 05/18/2020   Procedure: ESOPHAGOGASTRODUODENOSCOPY (EGD) WITH PROPOFOL;  Surgeon: Harvel Quale, MD;  [facility];  Service: Gastroenterology;  Laterality: N/A;  1100  . FOREIGN BODY REMOVAL Left 01/08/2015   Procedure: EXCISION FOREIGN BODY ;  Surgeon: Daryll Brod, MD;  Location: [place];  Service: Orthopedics;  Laterality: Left;  ANESTHESIA:  GENERAL, IV REGIONAL UPPER ARM  . I & D EXTREMITY Left 11/12/2014   Procedure: IRRIGATION AND DEBRIDEMENT LEFT INDEX FINGER;  Surgeon: Daryll Brod, MD;  Location: [place];  Service: Orthopedics;  Laterality: Left;  . INCISION AND DRAINAGE ABSCESS Left 12/22/2014   Procedure: INCISION AND DRAINAGE ABSCESS LEFT INDEX FINGER;  Surgeon: Daryll Brod, MD;  Location: [place];  Service: Orthopedics;  Laterality: Left;  . PERCUTANEOUS STENT INTERVENTION Left 08/09/2012   Procedure: PERCUTANEOUS STENT INTERVENTION;  Surgeon: Elam Dutch, MD;  Location: [facility] CATH LAB;  Service: Cardiovascular;  Laterality: Left;  stent to lt renal artery  . POLYPECTOMY  05/18/2020   Procedure: POLYPECTOMY;  Surgeon: Harvel Quale, MD;  [facility];  Service:  Gastroenterology;;  small bowel  . RENAL ANGIOGRAM N/A 08/09/2012   Procedure: RENAL ANGIOGRAM;  Surgeon: Elam Dutch, MD;  Location: [facility] CATH LAB;  Service: Cardiovascular;  Laterality: N/A;    reports that he has been smoking cigarettes. He has a 22.00 pack-year smoking history. He has never used smokeless tobacco. He reports that he does not drink alcohol and does not use drugs. family history includes AAA (abdominal aortic aneurysm) in his maternal uncle; Anesthesia problems in his mother; Cancer in his maternal uncle; Colon cancer in his paternal aunt; Diabetes in his father; Esophageal cancer in his paternal uncle; Heart attack in his father; Heart disease in his father and mother; Hyperlipidemia in his father, mother, and sister; Hypertension in his father and mother. Allergies  Allergen Reactions  . Niacin And Related Hives  . Codeine Other (See Comments)    GI upset ONLY in the cough syrup       Outpatient Encounter Medications as of 12/17/2020  Medication Sig  . acetaminophen (TYLENOL) 500 MG tablet Take 500-1,000 mg by mouth every 6 (six) hours as needed (for pain.).  [name] Kitchen alprazolam (XANAX) 2 MG tablet Take 2 mg by mouth at bedtime.  [name] Kitchen atorvastatin (LIPITOR) 40 MG tablet Take 1 tablet (40 mg total) by mouth daily.  . cholecalciferol (VITAMIN D) 25 MCG (1000 UNIT) tablet Take 2,000 Units by mouth daily.  . diphenhydrAMINE (BENADRYL) 25 MG tablet Take 25 mg by mouth every 6 (six) hours as needed for allergies.  . famotidine (PEPCID) 20 MG tablet Take 20 mg by mouth as needed.  . metoprolol succinate (TOPROL-XL) 50 MG 24 hr tablet TAKE 1 TABLET EVERY DAY  WITH  OR  IMMEDIATELY  FOLLOWING A MEAL  . MULTAQ 400 MG tablet TAKE 1 TABLET BY MOUTH DAILY. PLEASE KEEP UPCOMING APPT IN NOVEMBER WITH DR. Acie Fredrickson FOR FUTURE REFILLS. THANK YOU  . olmesartan (BENICAR) 40 MG tablet Take 1 tablet by mouth daily.  . SODIUM FLUORIDE 5000 PPM 1.1 % PSTE Place 1 application onto teeth at bedtime.   Alveda Reasons 20 MG TABS tablet TAKE 1 TABLET  DAILY WITH SUPPER.  [name] Kitchen Zinc 50 MG CAPS Take 50 mg by mouth daily.   No facility-administered encounter medications on file as of 12/17/2020.     REVIEW OF SYSTEMS  : All other systems reviewed and negative except where noted in the History of Present Illness.   PHYSICAL EXAM: BP 126/78   Pulse 70   Ht 5\' 10"  (1.778 m)   Wt 174 lb 12.8 oz (79.3 kg)   SpO2 98%   BMI 25.08 kg/m  General: Well developed white male in no acute distress Head: Normocephalic and atraumatic Eyes:  Sclerae anicteric, conjunctiva pink. Ears: Normal auditory acuity Lungs: Clear throughout to auscultation; no W/R/R. Heart: Regular rate and rhythm; no M/R/G. Abdomen: Soft, non-distended.  BS present.  Non-tender.  Rectal:  Will be done at the time of colonoscopy. Musculoskeletal: Symmetrical with no gross deformities  Skin: No lesions on visible extremities Extremities: No edema  Neurological: Alert oriented x 4, grossly non-focal Psychological:  Alert and cooperative. Normal mood and affect  ASSESSMENT AND PLAN: *Positive Cologuard study: Last colonoscopy 13 years ago.  We do not have those records, but he does not recall any polyps being removed.  We will plan for colonoscopy with Dr. Hilarie Fredrickson. *Chronic anticoagulation with Xarelto due to history of atrial fibrillation:  Will hold Xarelto for 24 hours prior to endoscopic procedures - will instruct when and how to resume after procedure. Benefits and risks of procedure explained including risks of bleeding, perforation, infection, missed lesions, reactions to medications and possible need for hospitalization and surgery for complications. Additional rare but real risk of stroke or other vascular clotting events off of Xarelto also explained and need to seek urgent help if any signs of these problems occur. Will communicate by phone or EMR with patient's  prescribing provider, Dr. Acie Fredrickson, to confirm that holding Xarelto is  reasonable in this case.    CC:  Asencion Noble, MD

## 2020-12-17 NOTE — Patient Instructions (Signed)
If you are age 69 or older, your body mass index should be between 23-30. Your Body mass index is 25.08 kg/m. If this is out of the aforementioned range listed, please consider follow up with your Primary Care Provider.  If you are age 63 or younger, your body mass index should be between 19-25. Your Body mass index is 25.08 kg/m. If this is out of the aformentioned range listed, please consider follow up with your Primary Care Provider.   You have been scheduled for a colonoscopy. Please follow written instructions given to you at your visit today.  Please pick up your prep supplies at the pharmacy within the next 1-3 days. If you use inhalers (even only as needed), please bring them with you on the day of your procedure.  Due to recent changes in healthcare laws, you may see the results of your imaging and laboratory studies on MyChart before your provider has had a chance to review them.  We understand that in some cases there may be results that are confusing or concerning to you. Not all laboratory results come back in the same time frame and the provider may be waiting for multiple results in order to interpret others.  Please give [place] 48 hours in order for your provider to thoroughly review all the results before contacting the office for clarification of your results.   Thank you for choosing me and [facility].  Alonza Bogus, PA-C

## 2020-12-18 DIAGNOSIS — I10 Essential (primary) hypertension: Secondary | ICD-10-CM | POA: Diagnosis not present

## 2020-12-18 DIAGNOSIS — E785 Hyperlipidemia, unspecified: Secondary | ICD-10-CM | POA: Diagnosis not present

## 2020-12-29 NOTE — Progress Notes (Signed)
Addendum: Reviewed and agree with assessment and management plan. Syble Picco M, MD  

## 2021-01-21 ENCOUNTER — Telehealth: Payer: Self-pay

## 2021-01-21 DIAGNOSIS — I480 Paroxysmal atrial fibrillation: Secondary | ICD-10-CM | POA: Diagnosis not present

## 2021-01-21 DIAGNOSIS — I2510 Atherosclerotic heart disease of native coronary artery without angina pectoris: Secondary | ICD-10-CM | POA: Diagnosis not present

## 2021-01-21 DIAGNOSIS — I10 Essential (primary) hypertension: Secondary | ICD-10-CM | POA: Diagnosis not present

## 2021-01-21 NOTE — Telephone Encounter (Signed)
Patient with diagnosis of A Fib on Xarelto for anticoagulation.    Procedure: colonoscopy Date of procedure: 01/25/21   CHA2DS2-VASc Score = 3  This indicates a 3.2% annual risk of stroke. The patient's score is based upon: CHF History: No HTN History: Yes Diabetes History: No Stroke History: No Vascular Disease History: Yes Age Score: 1 Gender Score: 0   CrCl 60 mL/min Platelet count overdue   Per office protocol, patient can hold Xarelto for 2 days prior to procedure.

## 2021-01-21 NOTE — Telephone Encounter (Signed)
This was sent to me erroneously.

## 2021-01-21 NOTE — Telephone Encounter (Signed)
    EUELL SCHIFF DOB:  14-May-1951  MRN:  [id number]   Primary Cardiologist: Mertie Moores, MD  Chart reviewed as part of pre-operative protocol coverage. Pharmacy clearance only.  YONA STANSBURY may hold Xarelto 2 days prior to planned procedure.   I will route this recommendation to the requesting party via Epic fax function and remove from pre-op pool.  Please call with questions.  Loel Dubonnet, NP 01/21/2021, 2:17 PM

## 2021-01-21 NOTE — Telephone Encounter (Signed)
[facility] Pre-operative Risk Assessment     Request for surgical clearance:     Endoscopy Procedure  What type of surgery is being performed?     Colonoscopy   When is this surgery scheduled?     01/25/21  What type of clearance is required ?   Pharmacy  Are there any medications that need to be held prior to surgery and how long? Xarelto 2 days   Practice name and name of physician performing surgery?      [facility]  What is your office phone and fax number?      Phone- [phone]  [phone]  Anesthesia type (None, local, MAC, general) ?       MAC

## 2021-01-21 NOTE — Telephone Encounter (Signed)
Pt needs cardio clearance for Xarelto for colonoscopy that's scheduled with Dr. Hilarie Fredrickson on 01/25/21.

## 2021-01-21 NOTE — Telephone Encounter (Signed)
   EIN RIJO 09-20-1950 [id number]  Dear Dr.Nahser:  We have scheduled the above named patient for a(n) Colonoscopy procedure. Our records show that (s)he is on anticoagulation therapy.  Please advise as to whether the patient may come off their therapy of Xarelto  2 days prior to their procedure which is scheduled for 01/25/21.  Please route your response to [place] or fax response to [phone].  Sincerely,    [facility]

## 2021-01-21 NOTE — Telephone Encounter (Signed)
Called patient and let him know that it ws okay to hold Xarelto 2 days prior to his procedure. Patient stated understanding.

## 2021-01-25 ENCOUNTER — Other Ambulatory Visit: Payer: Self-pay

## 2021-01-25 ENCOUNTER — Ambulatory Visit (AMBULATORY_SURGERY_CENTER): Payer: Medicare Other | Admitting: Internal Medicine

## 2021-01-25 ENCOUNTER — Encounter: Payer: Self-pay | Admitting: Internal Medicine

## 2021-01-25 VITALS — BP 152/67 | HR 49 | Temp 95.1°F | Resp 21 | Ht 70.0 in | Wt 174.0 lb

## 2021-01-25 DIAGNOSIS — K648 Other hemorrhoids: Secondary | ICD-10-CM | POA: Diagnosis not present

## 2021-01-25 DIAGNOSIS — R195 Other fecal abnormalities: Secondary | ICD-10-CM | POA: Diagnosis not present

## 2021-01-25 DIAGNOSIS — D122 Benign neoplasm of ascending colon: Secondary | ICD-10-CM

## 2021-01-25 DIAGNOSIS — D125 Benign neoplasm of sigmoid colon: Secondary | ICD-10-CM

## 2021-01-25 DIAGNOSIS — K5730 Diverticulosis of large intestine without perforation or abscess without bleeding: Secondary | ICD-10-CM

## 2021-01-25 DIAGNOSIS — D126 Benign neoplasm of colon, unspecified: Secondary | ICD-10-CM | POA: Diagnosis not present

## 2021-01-25 DIAGNOSIS — D123 Benign neoplasm of transverse colon: Secondary | ICD-10-CM

## 2021-01-25 MED ORDER — SODIUM CHLORIDE 0.9 % IV SOLN: 500.0000 mL | Freq: Once | INTRAVENOUS | Status: DC

## 2021-01-25 NOTE — Progress Notes (Signed)
Called to room to assist during endoscopic procedure.  Patient ID and intended procedure confirmed with present staff. Received instructions for my participation in the procedure from the performing physician.  

## 2021-01-25 NOTE — Progress Notes (Signed)
pt tolerated well. VSS. awake and to recovery. Report given to RN.  

## 2021-01-25 NOTE — Progress Notes (Signed)
Pt's states no medical or surgical changes since previsit or office visit. 

## 2021-01-25 NOTE — Progress Notes (Signed)
C.W. vital signs. 

## 2021-01-25 NOTE — Op Note (Signed)
[place] Patient Name: Tom [name] Procedure Date: 01/25/2021 8:36 AM MRN: [id number] Endoscopist: Jerene Bears , MD Age: 70 Referring MD:  Date of Birth: 11-18-1950 Gender: Male Account #: [id number] Procedure:                Colonoscopy Indications:              Positive Cologuard test, previous colonoscopy 13                            yrs ago Medicines:                Monitored Anesthesia Care Procedure:                Pre-Anesthesia Assessment:                           - Prior to the procedure, a History and Physical                            was performed, and patient medications and                            allergies were reviewed. The patient's tolerance of                            previous anesthesia was also reviewed. The risks                            and benefits of the procedure and the sedation                            options and risks were discussed with the patient.                            All questions were answered, and informed consent                            was obtained. Prior Anticoagulants: The patient has                            taken Xarelto (rivaroxaban), last dose was 2 days                            prior to procedure. ASA Grade Assessment: III - A                            patient with severe systemic disease. After                            reviewing the risks and benefits, the patient was                            deemed in satisfactory condition to undergo the  procedure.                           After obtaining informed consent, the colonoscope                            was passed under direct vision. Throughout the                            procedure, the patient's blood pressure, pulse, and                            oxygen saturations were monitored continuously. The                            Olympus CF-HQ190L [id number]) Colonoscope was                            introduced through  the anus and advanced to the                            cecum, identified by appendiceal orifice and                            ileocecal valve. The colonoscopy was performed                            without difficulty. The patient tolerated the                            procedure well. The quality of the bowel                            preparation was good. The ileocecal valve,                            appendiceal orifice, and rectum were photographed. Scope In: 8:47:42 AM Scope Out: 9:15:05 AM Scope Withdrawal Time: 0 hours 21 minutes 44 seconds  Total Procedure Duration: 0 hours 27 minutes 23 seconds  Findings:                 The digital rectal exam was normal.                           Two sessile polyps were found in the ascending                            colon. The polyps were 6 to 8 mm in size. These                            polyps were removed with a cold snare. Resection                            and retrieval were complete.  Three sessile polyps were found in the transverse                            colon. The polyps were 7 to 10 mm in size. These                            polyps were removed with a cold snare. Resection                            and retrieval were complete.                           A 18 mm polyp was found in the splenic flexure. The                            polyp was sessile. The polyp was removed with a                            cold snare. Resection and retrieval were complete.                           Two sessile polyps were found in the sigmoid colon.                            The polyps were 5 to 7 mm in size. These polyps                            were removed with a cold snare. Resection and                            retrieval were complete.                           Multiple small and large-mouthed diverticula were                            found in the sigmoid colon and descending colon.                            Internal hemorrhoids were found during                            retroflexion. The hemorrhoids were small. Complications:            No immediate complications. Estimated Blood Loss:     Estimated blood loss was minimal. Impression:               - Two 6 to 8 mm polyps in the ascending colon,                            removed with a cold snare. Resected and retrieved.                           -  Three 7 to 10 mm polyps in the transverse colon,                            removed with a cold snare. Resected and retrieved.                           - One 18 mm polyp at the splenic flexure, removed                            with a cold snare. Resected and retrieved.                           - Two 5 to 7 mm polyps in the sigmoid colon,                            removed with a cold snare. Resected and retrieved.                           - Diverticulosis in the sigmoid colon and in the                            descending colon.                           - Internal hemorrhoids. Recommendation:           - Patient has a contact number available for                            emergencies. The signs and symptoms of potential                            delayed complications were discussed with the                            patient. Return to normal activities tomorrow.                            Written discharge instructions were provided to the                            patient.                           - Resume previous diet.                           - Continue present medications.                           - Resume Xarelto (rivaroxaban) at prior dose in 2                            days. Refer to managing physician for further  adjustment of therapy.                           - Await pathology results.                           - Repeat colonoscopy is recommended for                            surveillance. The colonoscopy date will be                             determined after pathology results from today's                            exam become available for review. Jerene Bears, MD 01/25/2021 9:21:35 AM This report has been signed electronically.

## 2021-01-25 NOTE — Patient Instructions (Signed)
Handouts on polyps, hemorrhoids, and divericulosis given to you today  Await pathology results from Dr. Misty Stanley Xarelto on Thursday 01/27/21   YOU HAD AN ENDOSCOPIC PROCEDURE TODAY AT [place]:   Refer to the procedure report that was given to you for any specific questions about what was found during the examination.  If the procedure report does not answer your questions, please call your gastroenterologist to clarify.  If you requested that your care partner not be given the details of your procedure findings, then the procedure report has been included in a sealed envelope for you to review at your convenience later.  YOU SHOULD EXPECT: Some feelings of bloating in the abdomen. Passage of more gas than usual.  Walking can help get rid of the air that was put into your GI tract during the procedure and reduce the bloating. If you had a lower endoscopy (such as a colonoscopy or flexible sigmoidoscopy) you may notice spotting of blood in your stool or on the toilet paper. If you underwent a bowel prep for your procedure, you may not have a normal bowel movement for a few days.  Please Note:  You might notice some irritation and congestion in your nose or some drainage.  This is from the oxygen used during your procedure.  There is no need for concern and it should clear up in a day or so.  SYMPTOMS TO REPORT IMMEDIATELY:   Following lower endoscopy (colonoscopy or flexible sigmoidoscopy):  Excessive amounts of blood in the stool  Significant tenderness or worsening of abdominal pains  Swelling of the abdomen that is new, acute  Fever of 100F or higher  For urgent or emergent issues, a gastroenterologist can be reached at any hour by calling [phone]. Do not use MyChart messaging for urgent concerns.    DIET:  We do recommend a small meal at first, but then you may proceed to your regular diet.  Drink plenty of fluids but you should avoid alcoholic beverages  for 24 hours.  ACTIVITY:  You should plan to take it easy for the rest of today and you should NOT DRIVE or use heavy machinery until tomorrow (because of the sedation medicines used during the test).    FOLLOW UP: Our staff will call the number listed on your records 48-72 hours following your procedure to check on you and address any questions or concerns that you may have regarding the information given to you following your procedure. If we do not reach you, we will leave a message.  We will attempt to reach you two times.  During this call, we will ask if you have developed any symptoms of COVID 19. If you develop any symptoms (ie: fever, flu-like symptoms, shortness of breath, cough etc.) before then, please call [phone].  If you test positive for Covid 19 in the 2 weeks post procedure, please call and report this information to [place].    If any biopsies were taken you will be contacted by phone or by letter within the next 1-3 weeks.  Please call us at [phone] if you have not heard about the biopsies in 3 weeks.    SIGNATURES/CONFIDENTIALITY: You and/or your care partner have signed paperwork which will be entered into your electronic medical record.  These signatures attest to the fact that that the information above on your After Visit Summary has been reviewed and is understood.  Full responsibility of the confidentiality of this discharge information lies  with you and/or your care-partner.

## 2021-01-27 ENCOUNTER — Telehealth: Payer: Self-pay | Admitting: *Deleted

## 2021-01-27 NOTE — Telephone Encounter (Signed)
  Follow up Call-  Call back number 01/25/2021  Post procedure Call Back phone  # [phone]  Permission to leave phone message Yes  Some recent data might be hidden     Patient questions:  Do you have a fever, pain , or abdominal swelling? No. Pain Score  0 *  Have you tolerated food without any problems? Yes.    Have you been able to return to your normal activities? Yes.    Do you have any questions about your discharge instructions: Diet   No. Medications  No. Follow up visit  No.  Do you have questions or concerns about your Care? No.  Actions: * If pain score is 4 or above: No action needed, pain <4.  Covid-19 screening questions   Do you now or have you had a fever in the last 14 days?no  Do you have any respiratory symptoms of shortness of breath or cough now or in the last 14 days? no Do you have any family members or close contacts with diagnosed or suspected Covid-19 in the past 14 days?no  Have you been tested for Covid-19 and found to be positive?no

## 2021-01-28 ENCOUNTER — Encounter: Payer: Self-pay | Admitting: Internal Medicine

## 2021-02-17 DIAGNOSIS — I10 Essential (primary) hypertension: Secondary | ICD-10-CM | POA: Diagnosis not present

## 2021-02-17 DIAGNOSIS — E785 Hyperlipidemia, unspecified: Secondary | ICD-10-CM | POA: Diagnosis not present

## 2021-02-25 DIAGNOSIS — Z20822 Contact with and (suspected) exposure to covid-19: Secondary | ICD-10-CM | POA: Diagnosis not present

## 2021-03-20 DIAGNOSIS — E785 Hyperlipidemia, unspecified: Secondary | ICD-10-CM | POA: Diagnosis not present

## 2021-03-20 DIAGNOSIS — I10 Essential (primary) hypertension: Secondary | ICD-10-CM | POA: Diagnosis not present

## 2021-04-27 ENCOUNTER — Other Ambulatory Visit: Payer: Self-pay | Admitting: Cardiovascular Disease

## 2021-04-27 NOTE — Telephone Encounter (Signed)
Prescription refill request for Xarelto received.  Indication: Afib  Last office visit:09/21/20 (Tom [name])  Weight: 78.9kg Age: 70 Scr: 1.32 (09/21/20) CrCl: 58.92m/min  Appropriate dose and refill sent to requested pharmacy.

## 2021-05-20 DIAGNOSIS — I10 Essential (primary) hypertension: Secondary | ICD-10-CM | POA: Diagnosis not present

## 2021-05-20 DIAGNOSIS — E785 Hyperlipidemia, unspecified: Secondary | ICD-10-CM | POA: Diagnosis not present

## 2021-05-23 DIAGNOSIS — Z23 Encounter for immunization: Secondary | ICD-10-CM | POA: Diagnosis not present

## 2021-05-23 DIAGNOSIS — I10 Essential (primary) hypertension: Secondary | ICD-10-CM | POA: Diagnosis not present

## 2021-05-23 DIAGNOSIS — I2510 Atherosclerotic heart disease of native coronary artery without angina pectoris: Secondary | ICD-10-CM | POA: Diagnosis not present

## 2021-06-14 DIAGNOSIS — D3131 Benign neoplasm of right choroid: Secondary | ICD-10-CM | POA: Diagnosis not present

## 2021-06-14 DIAGNOSIS — H2513 Age-related nuclear cataract, bilateral: Secondary | ICD-10-CM | POA: Diagnosis not present

## 2021-06-14 DIAGNOSIS — H5203 Hypermetropia, bilateral: Secondary | ICD-10-CM | POA: Diagnosis not present

## 2021-07-02 DIAGNOSIS — Z20828 Contact with and (suspected) exposure to other viral communicable diseases: Secondary | ICD-10-CM | POA: Diagnosis not present

## 2021-07-12 ENCOUNTER — Other Ambulatory Visit: Payer: Self-pay | Admitting: Cardiovascular Disease

## 2021-09-26 DIAGNOSIS — I10 Essential (primary) hypertension: Secondary | ICD-10-CM | POA: Diagnosis not present

## 2021-09-26 DIAGNOSIS — I2510 Atherosclerotic heart disease of native coronary artery without angina pectoris: Secondary | ICD-10-CM | POA: Diagnosis not present

## 2021-09-26 DIAGNOSIS — I744 Embolism and thrombosis of arteries of extremities, unspecified: Secondary | ICD-10-CM | POA: Diagnosis not present

## 2021-10-03 DIAGNOSIS — D692 Other nonthrombocytopenic purpura: Secondary | ICD-10-CM | POA: Diagnosis not present

## 2021-10-03 DIAGNOSIS — L821 Other seborrheic keratosis: Secondary | ICD-10-CM | POA: Diagnosis not present

## 2021-10-03 DIAGNOSIS — Z85828 Personal history of other malignant neoplasm of skin: Secondary | ICD-10-CM | POA: Diagnosis not present

## 2021-10-19 ENCOUNTER — Other Ambulatory Visit: Payer: Self-pay

## 2021-10-19 DIAGNOSIS — I6523 Occlusion and stenosis of bilateral carotid arteries: Secondary | ICD-10-CM

## 2021-10-19 DIAGNOSIS — I701 Atherosclerosis of renal artery: Secondary | ICD-10-CM

## 2021-10-19 DIAGNOSIS — I714 Abdominal aortic aneurysm, without rupture, unspecified: Secondary | ICD-10-CM

## 2021-10-19 DIAGNOSIS — Z20822 Contact with and (suspected) exposure to covid-19: Secondary | ICD-10-CM | POA: Diagnosis not present

## 2021-11-02 ENCOUNTER — Other Ambulatory Visit: Payer: Self-pay

## 2021-11-02 ENCOUNTER — Ambulatory Visit (INDEPENDENT_AMBULATORY_CARE_PROVIDER_SITE_OTHER)
Admission: RE | Admit: 2021-11-02 | Discharge: 2021-11-02 | Disposition: A | Discharge status: Home / Self Care | Payer: Medicare Other | Source: Ambulatory Visit | Attending: Vascular Surgery | Admitting: Vascular Surgery

## 2021-11-02 ENCOUNTER — Ambulatory Visit (HOSPITAL_COMMUNITY)
Admission: RE | Admit: 2021-11-02 | Discharge: 2021-11-02 | Disposition: A | Discharge status: Home / Self Care | Payer: Medicare Other | Source: Ambulatory Visit | Attending: Vascular Surgery | Admitting: Vascular Surgery

## 2021-11-02 DIAGNOSIS — I714 Abdominal aortic aneurysm, without rupture, unspecified: Secondary | ICD-10-CM | POA: Insufficient documentation

## 2021-11-02 DIAGNOSIS — I6523 Occlusion and stenosis of bilateral carotid arteries: Secondary | ICD-10-CM | POA: Insufficient documentation

## 2021-11-02 DIAGNOSIS — I701 Atherosclerosis of renal artery: Secondary | ICD-10-CM | POA: Diagnosis not present

## 2021-11-15 NOTE — Progress Notes (Signed)
?Office Note  ? ? ? ?CC:  follow up ?Requesting Provider:  Asencion Noble, MD ? ?HPI: Tom [name] is a 71 y.o. (08-29-1950) male who presents for follow up of AAA, Carotid stenosis and RAS. He has remote history of EVAR by Dr. Oneida Alar as well as left renal artery stenting in 2013. He has been without any associated symptoms. He denies any back pain or abdominal pain. His carotid disease has been routinely followed with 1-39% right ICA stenosis and 40-59% left ICA stenosis. He denies any amaurosis fugax, monocular blindness, slurred speech, facial drooping, unilateral upper or lower extremity weakness or numbness. He does not have any claudication symptoms, rest pain or tissue loss. He says his blood pressure has been very good. Still taking ARB and BB but lower doses. 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  ? ?The pt is on a statin for cholesterol management.  ?The pt is on a daily aspirin.   Other AC:  Xarelto for atrial fibrillation ?The pt is on ARB, BB for hypertension.   ?The pt is not diabetic.   ?Tobacco hx:  current ? ?Past Medical History:  ?Diagnosis Date  ? AAA (abdominal aortic aneurysm)   ? Anxiety   ? Anxiety   ? Atrial fibrillation ([place]) 07/11/2013  ? CAD (coronary artery disease) 01/27/2011  ? Cancer [facility]) 2013  ? skin cancer,   MOHS surgery  ? Chronic pain of left knee 02/05/2018  ? Coronary artery disease   ? Dental bridge present   ? upper  ? Dental crowns present   ? Diarrhea 06/17/2010  ? Qualifier: Diagnosis of  By: Gala Romney MD, Quentin Ore, R. Legrand Como    ? Family history of adverse reaction to anesthesia   ? pt's mother has hx. of post-op N/V  ? Foreign body of finger of left hand 12/2014  ? index finger  ? GERD (gastroesophageal reflux disease)   ? Hypercholesteremia   ? Hyperlipidemia   ? Hypertension   ? states under control with med., has been on med. x 20 yr.  ? Hyponatremia 10/10/2018  ? Myocardial infarction [facility]) 1997  ? PAF (paroxysmal atrial fibrillation) ([place])   ? Syncope 10/10/2018   ? Tobacco abuse 01/27/2011  ? Wears glasses   ? ? ?Past Surgical History:  ?Procedure Laterality Date  ? ABDOMINAL AORTIC ANEURYSM REPAIR  06/12/2012  ? BIOPSY  05/18/2020  ? Procedure: BIOPSY;  Surgeon: Harvel Quale, MD;  [facility];  Service: Gastroenterology;;  duodenum  ? CARDIAC CATHETERIZATION  10/03/2005  ? COLONOSCOPY    ? CORONARY ARTERY BYPASS GRAFT  10/16/2005  ? LIMA to LAD saphenous vein to RCA  ? ESOPHAGOGASTRODUODENOSCOPY (EGD) WITH PROPOFOL N/A 05/18/2020  ? Procedure: ESOPHAGOGASTRODUODENOSCOPY (EGD) WITH PROPOFOL;  Surgeon: Harvel Quale, MD;  [facility];  Service: Gastroenterology;  Laterality: N/A;  1100  ? FOREIGN BODY REMOVAL Left 01/08/2015  ? Procedure: EXCISION FOREIGN BODY ;  Surgeon: Daryll Brod, MD;  Location: [place];  Service: Orthopedics;  Laterality: Left;  ANESTHESIA:  GENERAL, IV REGIONAL UPPER ARM  ? I & D EXTREMITY Left 11/12/2014  ? Procedure: IRRIGATION AND DEBRIDEMENT LEFT INDEX FINGER;  Surgeon: Daryll Brod, MD;  Location: [place];  Service: Orthopedics;  Laterality: Left;  ? INCISION AND DRAINAGE ABSCESS Left 12/22/2014  ? Procedure: INCISION AND DRAINAGE ABSCESS LEFT INDEX FINGER;  Surgeon: Daryll Brod, MD;  Location: [place];  Service:  Orthopedics;  Laterality: Left;  ? PERCUTANEOUS STENT INTERVENTION Left 08/09/2012  ? Procedure: PERCUTANEOUS STENT INTERVENTION;  Surgeon: Elam Dutch, MD;  Location: [facility] CATH LAB;  Service: Cardiovascular;  Laterality: Left;  stent to lt renal artery  ? POLYPECTOMY  05/18/2020  ? Procedure: POLYPECTOMY;  Surgeon: Harvel Quale, MD;  [facility];  Service: Gastroenterology;;  small bowel  ? RENAL ANGIOGRAM N/A 08/09/2012  ? Procedure: RENAL ANGIOGRAM;  Surgeon: Elam Dutch, MD;  Location[facility];  Service: Cardiovascular;  Laterality: N/A;  ? ? ?Social History  ? ?Socioeconomic History  ? Marital status: Married   ?  Spouse name: Magda Paganini  ? Number of children: 1 s  ? Years of education: Not on file  ? Highest education level: Not on file  ?Occupational History  ? Not on file  ?Tobacco Use  ? Smoking status: Every Day  ?  Packs/day: 0.50  ?  Years: 44.00  ?  Pack years: 22.00  ?  Types: Cigarettes  ? Smokeless tobacco: Never  ?Vaping Use  ? Vaping Use: Never used  ?Substance and Sexual Activity  ? Alcohol use: No  ?  Alcohol/week: 0.0 standard drinks  ? Drug use: No  ? Sexual activity: Yes  ?Other Topics Concern  ? Not on file  ?Social History Narrative  ? Not on file  ? ?Social Determinants of Health  ? ?Financial Resource Strain: Not on file  ?Food Insecurity: Not on file  ?Transportation Needs: Not on file  ?Physical Activity: Not on file  ?Stress: Not on file  ?Social Connections: Not on file  ?Intimate Partner Violence: Not on file  ? ? ?Family History  ?Problem Relation Age of Onset  ? Heart disease Father   ?     before age 84  ? Hypertension Father   ? Hyperlipidemia Father   ? Diabetes Father   ? Heart attack Father   ? Heart disease Mother   ? Hypertension Mother   ? Hyperlipidemia Mother   ? Anesthesia problems Mother   ?     hx. of post-op N/V  ? AAA (abdominal aortic aneurysm) Maternal Uncle   ? Cancer Maternal Uncle   ? Hyperlipidemia Sister   ? Colon cancer Paternal Aunt   ? Esophageal cancer Paternal Uncle   ? Stomach cancer Neg Hx   ? Pancreatic cancer Neg Hx   ? Liver disease Neg Hx   ? ? ?Current Outpatient Medications  ?Medication Sig Dispense Refill  ? acetaminophen (TYLENOL) 500 MG tablet Take 500-1,000 mg by mouth every 6 (six) hours as needed (for pain.).    ? alprazolam (XANAX) 2 MG tablet Take 2 mg by mouth at bedtime.    ? atorvastatin (LIPITOR) 40 MG tablet Take 1 tablet (40 mg total) by mouth daily. 90 tablet 3  ? diphenhydrAMINE (BENADRYL) 25 MG tablet Take 25 mg by mouth every 6 (six) hours as needed for allergies.    ? dronedarone (MULTAQ) 400 MG tablet Take 1 tablet (400 mg total) by mouth  daily. 90 tablet 0  ? metoprolol succinate (TOPROL-XL) 50 MG 24 hr tablet TAKE 1 TABLET EVERY DAY  WITH  OR  IMMEDIATELY  FOLLOWING A MEAL 90 tablet 0  ? olmesartan (BENICAR) 40 MG tablet Take 1 tablet by mouth daily.    ? XARELTO 20 MG TABS tablet TAKE 1 TABLET EVERY DAY WITH SUPPER 90 tablet 2  ? cholecalciferol (VITAMIN D) 25 MCG (1000 UNIT) tablet Take  2,000 Units by mouth daily. (Patient not taking: Reported on 11/18/2021)    ? famotidine (PEPCID) 20 MG tablet Take 20 mg by mouth as needed. (Patient not taking: Reported on 11/18/2021)    ? SODIUM FLUORIDE 5000 PPM 1.1 % PSTE Place 1 application onto teeth at bedtime.  (Patient not taking: Reported on 01/25/2021)    ? Zinc 50 MG CAPS Take 50 mg by mouth daily. (Patient not taking: Reported on 01/25/2021)    ? ?No current facility-administered medications for this visit.  ? ? ?Allergies  ?Allergen Reactions  ? Niacin And Related Hives  ? Codeine Other (See Comments)  ?  GI upset ONLY in the cough syrup   ? ? ? ?REVIEW OF SYSTEMS:  ?'[X]'$  denotes positive finding, '[ ]'$  denotes negative finding ?Cardiac  Comments:  ?Chest pain or chest pressure:    ?Shortness of breath upon exertion:    ?Short of breath when lying flat:    ?Irregular heart rhythm:    ?    ?Vascular    ?Pain in calf, thigh, or hip brought on by ambulation:    ?Pain in feet at night that wakes you up from your sleep:     ?Blood clot in your veins:    ?Leg swelling:     ?    ?Pulmonary    ?Oxygen at home:    ?Productive cough:     ?Wheezing:     ?    ?Neurologic    ?Sudden weakness in arms or legs:     ?Sudden numbness in arms or legs:     ?Sudden onset of difficulty speaking or slurred speech:    ?Temporary loss of vision in one eye:     ?Problems with dizziness:     ?    ?Gastrointestinal    ?Blood in stool:     ?Vomited blood:     ?    ?Genitourinary    ?Burning when urinating:     ?Blood in urine:    ?    ?Psychiatric    ?Major depression:     ?    ?Hematologic    ?Bleeding problems:    ?Problems with  blood clotting too easily:    ?    ?Skin    ?Rashes or ulcers:    ?    ?Constitutional    ?Fever or chills:    ? ? ?PHYSICAL EXAMINATION: ? ?Vitals:  ? 11/18/21 0837 11/18/21 0840  ?BP: 125/70 121/69  ?Pulse: (

## 2021-11-18 ENCOUNTER — Ambulatory Visit (INDEPENDENT_AMBULATORY_CARE_PROVIDER_SITE_OTHER): Payer: Medicare Other | Admitting: Physician Assistant

## 2021-11-18 ENCOUNTER — Encounter: Payer: Self-pay | Admitting: Physician Assistant

## 2021-11-18 VITALS — BP 121/69 | HR 55 | Temp 97.3°F | Resp 18 | Ht 70.0 in | Wt 168.0 lb

## 2021-11-18 DIAGNOSIS — I6523 Occlusion and stenosis of bilateral carotid arteries: Secondary | ICD-10-CM

## 2021-11-18 DIAGNOSIS — I714 Abdominal aortic aneurysm, without rupture, unspecified: Secondary | ICD-10-CM | POA: Diagnosis not present

## 2021-11-18 DIAGNOSIS — I701 Atherosclerosis of renal artery: Secondary | ICD-10-CM

## 2021-12-05 ENCOUNTER — Encounter: Payer: Self-pay | Admitting: Cardiovascular Disease

## 2021-12-05 NOTE — Progress Notes (Signed)
Tom [name] ?Date of Birth  [birth year] ?[facility] ?[address] ?[place],   [address] ?[phone]  Fax  [phone] ? ?Problem list: ?1. Coronary artery disease-status post CABG. February 2007. He had a LIMA  to the LAD and a SVG to the right coronary artery. ?2. Hypertension ?3. Hyperlipidemia ?4. Renal insufficiency ?5. Peripheral Vascular disease:  S/p AAA stent graft repair ( Oct. 2013)  s/p renal artery stenting  ( Dec. 2013) ?6. Paroxysmal atrial fibrillation: ? ? ?  ? ?71 y.o. year-old gentleman with a history of coronary disease. He status post coronary artery bypass grafting in February 2007. He had a left internal mammary artery to the LAD and a saphenous vein graft to the right coronary artery. He also has a history of hypercholesterolemia, hypertension, and ongoing cigarette smoking. ?He's not had any episodes of chest pain, shortness breath, syncope, or presyncope.  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 He does not do any regular aerobic exercise. ?He's not had any recurrent episodes of chest pain. He tries to watch his salt intake.  He still smokes about a half a pack of cigarettes a day but has cut back from 2 packs a day. 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 ? ?November 22, 2012: ? ?He has had an AAA stent graft repair and renal artery stenting since I last saw him.  He has had several episodes of chest pressure / tightness.  He found his BP and Hr  to be elevated.  He took and extra Toprol and ASA and felt better within an hour. ? ?Oct. 24, 2014: ? ?Tom [name] is having CP and palpitations .  Some relief with Inderal but occasionally has to take some NTG.  Symptoms seen to occur more at night - not during the day while he is working.  stil smoking 1/2 ppd. ? ?Nov. 21, 2014: ? ?Tom [name] is doing ok.  He was diagnosed with atrial fib by event monitor.  He has had several episodes of PAF - resolved after several  propranolol ? ?Jan 02, 2014: ? ?He is doing well.  Still have PAF - probably once a month.  Will last 4-6 hours.   He does not drink ETOH.   still smoking some .  Works as a Curator.  ? ?Oct. 24, 2016: ? ?No CP ,  ?No recent episodes of PAF since starting the Multaq.  Was started by Dr. Lovena Le in Oct. 2015 ?Has not been taking his amlodipine due to leg swelling . Doing well.   ?BP is typcially ok ? ?Getting some exercise ?Tom [name] work  ?Retired from painting   ? ?Oct. 24, 2017: ? ?Tom [name] is doing well.  ?Thinks he forgot his BP meds today .  ?Typical BP is well controlled.  ?Hs PAF - was started on Multaq.  ? ?Oct. 25, 2018: ? ?Has lost 25 lbs. - purposeful weight loss ?Started cutting back on his BP meds ?Stopped Valsartan when it was recalled.  ?Has had orthostasis - once while using a chain saw. ?Several episodes of orthostasis getting up to the BR at night .  ? ?No CP , no dyspnea ?No syncope since adjusting his meds.  ?Cut his toprol XL to 50  ?Changed valsartan to Losartan  ?Has not had any a-fib since being on Multaq ? ?Oct. 31, 2019: ? ?Tom [name] is seen today for follow up of his CAD,  HTN,  HLD   and PAF .   ?Wt. Is 168-  ?Had an episode of leg swelling on 1 occasion. ?Is on Multaq - his primary MD gave him some HCTZ to take on occasion .  ?Seems to be related to being on his feet.  ?Takes the HCTZ maybe 1-2 times a week .  ?Thinks he may be able to get eliquis cheaper than Xarelto .  ?Has been taking the Multaq once a day due to cost  ?Encouraged him to see Dr. Lovena Le again if he wants to switch .  ? ?Jan. 8, 2021: ?Tom [name] is seen today for follow-up visit.  He has a history of coronary artery disease and is status post coronary artery bypass grafting.  He has a history of paroxysmal atrial fibrillation. ?No recent episodes of AF. ?Is active around the house - cuttng trees, ? ?Also keeping up his mother house.  ? ?Feb. 1, 2022: ?Tom [name] is seen today for follow up of his CAD, PAF ?No exercise per se ?Walks,  hunts ?Doing some painting  ?Splits firewood  ?Still smoking ,  Ive recommended that he stop smoking  ? ?December 06, 2021 ?Tom [name] is seen for follow up of his CAd, PAF, HLD  ?No cp, no dyspnea  ?Still smoking some ,  ? ? ?Current Outpatient Medications on File Prior to Visit  ?Medication Sig Dispense Refill  ? acetaminophen (TYLENOL) 500 MG tablet Take 500-1,000 mg by mouth every 6 (six) hours as needed (for pain.).    ? alprazolam (XANAX) 2 MG tablet Take 2 mg by mouth at bedtime.    ? atorvastatin (LIPITOR) 40 MG tablet Take 1 tablet (40 mg total) by mouth daily. 90 tablet 3  ? diphenhydrAMINE (BENADRYL) 25 MG tablet Take 25 mg by mouth every 6 (six) hours as needed for allergies.    ? dronedarone (MULTAQ) 400 MG tablet Take 1 tablet (400 mg total) by mouth daily. 90 tablet 0  ? metoprolol succinate (TOPROL-XL) 50 MG 24 hr tablet TAKE 1 TABLET EVERY DAY  WITH  OR  IMMEDIATELY  FOLLOWING A MEAL 90 tablet 0  ? nitroGLYCERIN (NITROSTAT) 0.4 MG SL tablet Place under the tongue.    ? olmesartan (BENICAR) 40 MG tablet Take 1 tablet by mouth daily.    ? XARELTO 20 MG TABS tablet TAKE 1 TABLET EVERY DAY WITH SUPPER 90 tablet 2  ? cholecalciferol (VITAMIN D) 25 MCG (1000 UNIT) tablet Take 2,000 Units by mouth daily. (Patient not taking: Reported on 11/18/2021)    ? famotidine (PEPCID) 20 MG tablet Take 20 mg by mouth as needed. (Patient not taking: Reported on 11/18/2021)    ? SODIUM FLUORIDE 5000 PPM 1.1 % PSTE Place 1 application onto teeth at bedtime.  (Patient not taking: Reported on 01/25/2021)    ? Zinc 50 MG CAPS Take 50 mg by mouth daily. (Patient not taking: Reported on 01/25/2021)    ? ?No current facility-administered medications on file prior to visit.  ? ? ?Allergies  ?Allergen Reactions  ? Niacin And Related Hives  ? Codeine Other (See Comments)  ?  GI upset ONLY in the cough syrup   ? ? ?Past Medical History:  ?Diagnosis Date  ? AAA (abdominal aortic aneurysm) ([place])   ? Anxiety   ? Anxiety   ? Atrial fibrillation  ([place]) 07/11/2013  ? CAD (coronary artery disease) 01/27/2011  ? Cancer [facility]) 2013  ? skin cancer,   MOHS surgery  ? Chronic pain of left  knee 02/05/2018  ? Coronary artery disease   ? Dental bridge present   ? upper  ? Dental crowns present   ? Diarrhea 06/17/2010  ? Qualifier: Diagnosis of  By: Gala Romney MD, Quentin Ore, R. Legrand Como    ? Family history of adverse reaction to anesthesia   ? pt's mother has hx. of post-op N/V  ? Foreign body of finger of left hand 12/2014  ? index finger  ? GERD (gastroesophageal reflux disease)   ? Hypercholesteremia   ? Hyperlipidemia   ? Hypertension   ? states under control with med., has been on med. x 20 yr.  ? Hyponatremia 10/10/2018  ? Myocardial infarction [facility]) 1997  ? PAF (paroxysmal atrial fibrillation) ([place])   ? Syncope 10/10/2018  ? Tobacco abuse 01/27/2011  ? Wears glasses   ? ? ?Past Surgical History:  ?Procedure Laterality Date  ? ABDOMINAL AORTIC ANEURYSM REPAIR  06/12/2012  ? BIOPSY  05/18/2020  ? Procedure: BIOPSY;  Surgeon: Harvel Quale, MD;  [facility];  Service: Gastroenterology;;  duodenum  ? CARDIAC CATHETERIZATION  10/03/2005  ? COLONOSCOPY    ? CORONARY ARTERY BYPASS GRAFT  10/16/2005  ? LIMA to LAD saphenous vein to RCA  ? ESOPHAGOGASTRODUODENOSCOPY (EGD) WITH PROPOFOL N/A 05/18/2020  ? Procedure: ESOPHAGOGASTRODUODENOSCOPY (EGD) WITH PROPOFOL;  Surgeon: Harvel Quale, MD;  [facility];  Service: Gastroenterology;  Laterality: N/A;  1100  ? FOREIGN BODY REMOVAL Left 01/08/2015  ? Procedure: EXCISION FOREIGN BODY ;  Surgeon: Daryll Brod, MD;  Location: [place];  Service: Orthopedics;  Laterality: Left;  ANESTHESIA:  GENERAL, IV REGIONAL UPPER ARM  ? I & D EXTREMITY Left 11/12/2014  ? Procedure: IRRIGATION AND DEBRIDEMENT LEFT INDEX FINGER;  Surgeon: Daryll Brod, MD;  Location: [place];  Service: Orthopedics;  Laterality: Left;  ? INCISION AND DRAINAGE ABSCESS Left 12/22/2014  ? Procedure: INCISION  AND DRAINAGE ABSCESS LEFT INDEX FINGER;  Surgeon: Daryll Brod, MD;  Location: [place];  Service: Orthopedics;  Laterality: Left;  ? PERCUTANEOUS STENT INTERVENTION Left 08/09/2012  ? Procedure: PERCUTANEOUS

## 2021-12-06 ENCOUNTER — Encounter: Payer: Self-pay | Admitting: Cardiovascular Disease

## 2021-12-06 ENCOUNTER — Ambulatory Visit (INDEPENDENT_AMBULATORY_CARE_PROVIDER_SITE_OTHER): Payer: Medicare Other | Admitting: Cardiovascular Disease

## 2021-12-06 VITALS — BP 134/72 | HR 50 | Ht 70.0 in | Wt 172.6 lb

## 2021-12-06 DIAGNOSIS — I701 Atherosclerosis of renal artery: Secondary | ICD-10-CM

## 2021-12-06 DIAGNOSIS — E7800 Pure hypercholesterolemia, unspecified: Secondary | ICD-10-CM | POA: Diagnosis not present

## 2021-12-06 DIAGNOSIS — I480 Paroxysmal atrial fibrillation: Secondary | ICD-10-CM

## 2021-12-06 DIAGNOSIS — I2510 Atherosclerotic heart disease of native coronary artery without angina pectoris: Secondary | ICD-10-CM | POA: Diagnosis not present

## 2021-12-06 DIAGNOSIS — Z20822 Contact with and (suspected) exposure to covid-19: Secondary | ICD-10-CM | POA: Diagnosis not present

## 2021-12-06 LAB — CBC
Hematocrit: 42.9 % (ref 37.5–51.0)
Hemoglobin: 14.7 g/dL (ref 13.0–17.7)
MCH: 30.9 pg (ref 26.6–33.0)
MCHC: 34.3 g/dL (ref 31.5–35.7)
MCV: 90 fL (ref 79–97)
Platelets: 258 10*3/uL (ref 150–450)
RBC: 4.75 x10E6/uL (ref 4.14–5.80)
RDW: 12.7 % (ref 11.6–15.4)
WBC: 9.8 10*3/uL (ref 3.4–10.8)

## 2021-12-06 LAB — BASIC METABOLIC PANEL
BUN/Creatinine Ratio: 8 — ABNORMAL LOW (ref 10–24)
BUN: 12 mg/dL (ref 8–27)
CO2: 24 mmol/L (ref 20–29)
Calcium: 9.3 mg/dL (ref 8.6–10.2)
Chloride: 100 mmol/L (ref 96–106)
Creatinine, Ser: 1.43 mg/dL — ABNORMAL HIGH (ref 0.76–1.27)
Glucose: 77 mg/dL (ref 70–99)
Potassium: 4.8 mmol/L (ref 3.5–5.2)
Sodium: 135 mmol/L (ref 134–144)
eGFR: 53 mL/min/{1.73_m2} — ABNORMAL LOW (ref >59)

## 2021-12-06 LAB — LIPID PANEL
Chol/HDL Ratio: 3.7 ratio (ref 0.0–5.0)
Cholesterol, Total: 108 mg/dL (ref 100–199)
HDL: 29 mg/dL — ABNORMAL LOW (ref >39)
LDL Chol Calc (NIH): 58 mg/dL (ref 0–99)
Triglycerides: 113 mg/dL (ref 0–149)
VLDL Cholesterol Cal: 21 mg/dL (ref 5–40)

## 2021-12-06 LAB — ALT: ALT: 15 IU/L (ref 0–44)

## 2021-12-06 NOTE — Patient Instructions (Signed)
Medication Instructions:  ?Your physician recommends that you continue on your current medications as directed. Please refer to the Current Medication list given to you today. ? ?*If you need a refill on your cardiac medications before your next appointment, please call your pharmacy* ? ?Lab Work: ?TODAY: CBC, Lipids, ALT, BMP ?If you have labs (blood work) drawn today and your tests are completely normal, you will receive your results only by: ?MyChart Message (if you have MyChart) OR ?A paper copy in the mail ?If you have any lab test that is abnormal or we need to change your treatment, we will call you to review the results. ? ?Testing/Procedures: ?NONE ? ?Follow-Up: ?At [facility], you and your health needs are our priority.  As part of our continuing mission to provide you with exceptional heart care, we have created designated Provider Care Teams.  These Care Teams include your primary Cardiologist (physician) and Advanced Practice Providers (APPs -  Physician Assistants and Nurse Practitioners) who all work together to provide you with the care you need, when you need it. ? ?Your next appointment:   ?1 year(s) ? ?The format for your next appointment:   ?In Person ? ?Provider:   ?Mertie Moores, MD  or Robbie Lis, PA-C, Christen Bame, NP, or Richardson Dopp, PA-C      ? ?Other Instructions ? ?Important Information About Sugar ? ? ? ? ?  ?

## 2022-02-10 ENCOUNTER — Other Ambulatory Visit: Payer: Self-pay | Admitting: Cardiovascular Disease

## 2022-02-10 MED ORDER — MULTAQ 400 MG PO TABS: 400.0000 mg | ORAL_TABLET | Freq: Every day | ORAL | 3 refills | Status: DC

## 2022-05-01 ENCOUNTER — Other Ambulatory Visit: Payer: Self-pay | Admitting: Cardiovascular Disease

## 2022-05-01 NOTE — Telephone Encounter (Signed)
Prescription refill request for Xarelto received.  Indication:Afib Last office visit:4/23 Weight:78.3 kg Age:71 Scr:1.4 CrCl:54.38  ml/min  Prescription refilled

## 2022-06-15 DIAGNOSIS — H2513 Age-related nuclear cataract, bilateral: Secondary | ICD-10-CM | POA: Diagnosis not present

## 2022-06-15 DIAGNOSIS — D3131 Benign neoplasm of right choroid: Secondary | ICD-10-CM | POA: Diagnosis not present

## 2022-06-15 DIAGNOSIS — H5203 Hypermetropia, bilateral: Secondary | ICD-10-CM | POA: Diagnosis not present

## 2022-07-06 DIAGNOSIS — Z23 Encounter for immunization: Secondary | ICD-10-CM | POA: Diagnosis not present

## 2022-07-06 DIAGNOSIS — I10 Essential (primary) hypertension: Secondary | ICD-10-CM | POA: Diagnosis not present

## 2022-07-06 DIAGNOSIS — I2510 Atherosclerotic heart disease of native coronary artery without angina pectoris: Secondary | ICD-10-CM | POA: Diagnosis not present

## 2022-07-06 DIAGNOSIS — I480 Paroxysmal atrial fibrillation: Secondary | ICD-10-CM | POA: Diagnosis not present

## 2022-08-15 ENCOUNTER — Other Ambulatory Visit: Payer: Self-pay | Admitting: Cardiovascular Disease

## 2022-08-15 MED ORDER — RIVAROXABAN 20 MG PO TABS: ORAL_TABLET | ORAL | 2 refills | Status: DC

## 2022-08-15 MED ORDER — MULTAQ 400 MG PO TABS: 400.0000 mg | ORAL_TABLET | Freq: Every day | ORAL | 0 refills | Status: DC

## 2022-08-15 NOTE — Telephone Encounter (Signed)
*  STAT* If patient is at the pharmacy, call can be transferred to refill team.   1. Which medications need to be refilled? (please list name of each medication and dose if known) new prescriptions for Xarelto and Multaq- changing pharmacy  2. Which pharmacy/location (including street and city if local pharmacy) is medication to be sent to? He said they gave him a number for [employer] for you to call in [phone]  3. Do they need a 30 day or 90 day supply? 90  days and refills

## 2022-08-15 NOTE — Telephone Encounter (Signed)
Prescription refill request for Xarelto received.  Indication:afib Last office visit:4/23 Weight:78.3  kg Age:71 Scr:1.4 CrCl:54.38  ml/min  Prescription refilled

## 2022-08-16 ENCOUNTER — Other Ambulatory Visit: Payer: Self-pay

## 2022-08-16 MED ORDER — MULTAQ 400 MG PO TABS: 400.0000 mg | ORAL_TABLET | Freq: Every day | ORAL | 0 refills | Status: DC

## 2022-08-16 NOTE — Telephone Encounter (Signed)
Pt's medication was sent to pt's pharmacy as requested. Confirmation received.  °

## 2022-12-26 DIAGNOSIS — D692 Other nonthrombocytopenic purpura: Secondary | ICD-10-CM | POA: Diagnosis not present

## 2022-12-26 DIAGNOSIS — L570 Actinic keratosis: Secondary | ICD-10-CM | POA: Diagnosis not present

## 2022-12-26 DIAGNOSIS — Z85828 Personal history of other malignant neoplasm of skin: Secondary | ICD-10-CM | POA: Diagnosis not present

## 2022-12-26 DIAGNOSIS — L821 Other seborrheic keratosis: Secondary | ICD-10-CM | POA: Diagnosis not present

## 2022-12-26 DIAGNOSIS — D225 Melanocytic nevi of trunk: Secondary | ICD-10-CM | POA: Diagnosis not present

## 2023-01-02 ENCOUNTER — Other Ambulatory Visit: Payer: Self-pay

## 2023-01-02 MED ORDER — MULTAQ 400 MG PO TABS: 400.0000 mg | ORAL_TABLET | Freq: Every day | ORAL | 0 refills | Status: DC

## 2023-01-04 ENCOUNTER — Other Ambulatory Visit: Payer: Self-pay | Admitting: *Deleted

## 2023-01-04 MED ORDER — MULTAQ 400 MG PO TABS: 400.0000 mg | ORAL_TABLET | Freq: Every day | ORAL | 0 refills | Status: DC

## 2023-01-25 DIAGNOSIS — I480 Paroxysmal atrial fibrillation: Secondary | ICD-10-CM | POA: Diagnosis not present

## 2023-01-25 DIAGNOSIS — I2510 Atherosclerotic heart disease of native coronary artery without angina pectoris: Secondary | ICD-10-CM | POA: Diagnosis not present

## 2023-01-25 DIAGNOSIS — I10 Essential (primary) hypertension: Secondary | ICD-10-CM | POA: Diagnosis not present

## 2023-02-01 ENCOUNTER — Other Ambulatory Visit: Payer: Self-pay | Admitting: *Deleted

## 2023-02-01 DIAGNOSIS — I701 Atherosclerosis of renal artery: Secondary | ICD-10-CM

## 2023-02-01 DIAGNOSIS — Z9889 Other specified postprocedural states: Secondary | ICD-10-CM

## 2023-02-01 DIAGNOSIS — I6523 Occlusion and stenosis of bilateral carotid arteries: Secondary | ICD-10-CM

## 2023-02-09 ENCOUNTER — Ambulatory Visit (INDEPENDENT_AMBULATORY_CARE_PROVIDER_SITE_OTHER)
Admission: RE | Admit: 2023-02-09 | Discharge: 2023-02-09 | Disposition: A | Discharge status: Home / Self Care | Payer: Medicare HMO | Source: Ambulatory Visit | Attending: Vascular Surgery

## 2023-02-09 ENCOUNTER — Ambulatory Visit (INDEPENDENT_AMBULATORY_CARE_PROVIDER_SITE_OTHER)
Admission: RE | Admit: 2023-02-09 | Discharge: 2023-02-09 | Disposition: A | Discharge status: Home / Self Care | Payer: Medicare HMO | Source: Ambulatory Visit | Attending: Vascular Surgery | Admitting: Vascular Surgery

## 2023-02-09 ENCOUNTER — Ambulatory Visit (HOSPITAL_COMMUNITY)
Admission: RE | Admit: 2023-02-09 | Discharge: 2023-02-09 | Disposition: A | Discharge status: Home / Self Care | Payer: Medicare HMO | Source: Ambulatory Visit | Attending: Vascular Surgery | Admitting: Vascular Surgery

## 2023-02-09 ENCOUNTER — Ambulatory Visit: Payer: Medicare HMO | Admitting: Physician Assistant

## 2023-02-09 VITALS — BP 164/91 | HR 48 | Temp 97.6°F | Resp 16 | Ht 70.0 in | Wt 165.0 lb

## 2023-02-09 DIAGNOSIS — I701 Atherosclerosis of renal artery: Secondary | ICD-10-CM

## 2023-02-09 DIAGNOSIS — I6523 Occlusion and stenosis of bilateral carotid arteries: Secondary | ICD-10-CM | POA: Diagnosis not present

## 2023-02-09 DIAGNOSIS — Z9889 Other specified postprocedural states: Secondary | ICD-10-CM

## 2023-02-09 DIAGNOSIS — Z8679 Personal history of other diseases of the circulatory system: Secondary | ICD-10-CM | POA: Diagnosis not present

## 2023-02-09 DIAGNOSIS — I714 Abdominal aortic aneurysm, without rupture, unspecified: Secondary | ICD-10-CM

## 2023-02-09 NOTE — Progress Notes (Signed)
Office Note     CC:  follow up Requesting Provider:  Carylon Perches, MD  HPI: Tom [name] is a 72 y.o. (Jan 01, 1951) male who presents for surveillance repaired AAA, carotid artery stenosis, and renal artery stenosis.  He underwent endovascular repair of abdominal aortic aneurysm by Dr. Darrick Penna as well as left renal artery stenting in 2013.  He denies any new or changing abdominal or back pain.  He also denies any uncontrolled hypertension or recent antihypertensive medication changes.  He believes lab work for renal function stable over the last several visits with his PCP.  He is also followed for carotid artery stenosis.  He denies any diagnosis of TIA or CVA since last office visit.  Subjectively he has not had any vision changes, one-sided weakness or speech.  He is on a daily statin.  He is on Xarelto for atrial fibrillation.   Past Medical History:  Diagnosis Date   AAA (abdominal aortic aneurysm) ([facility])    Anxiety    Anxiety    Atrial fibrillation ([facility]) 07/11/2013   CAD (coronary artery disease) 01/27/2011   Cancer ([facility]) 2013   skin cancer,   MOHS surgery   Chronic pain of left knee 02/05/2018   Coronary artery disease    Dental bridge present    upper   Dental crowns present    Diarrhea 06/17/2010   Qualifier: Diagnosis of  By: Jena Gauss MD, Caleen Essex, R. Casimiro Needle     Family history of adverse reaction to anesthesia    pt's mother has hx. of post-op N/V   Foreign body of finger of left hand 12/2014   index finger   GERD (gastroesophageal reflux disease)    Hypercholesteremia    Hyperlipidemia    Hypertension    states under control with med., has been on med. x 20 yr.   Hyponatremia 10/10/2018   Myocardial infarction ([facility]) 1997   PAF (paroxysmal atrial fibrillation) ([facility])    Syncope 10/10/2018   Tobacco abuse 01/27/2011   Wears glasses     Past Surgical History:  Procedure Laterality Date   ABDOMINAL AORTIC ANEURYSM REPAIR  06/12/2012   BIOPSY  05/18/2020   Procedure: BIOPSY;   Surgeon: Dolores Frame, MD;  [facility];  Service: Gastroenterology;;  duodenum   CARDIAC CATHETERIZATION  10/03/2005   COLONOSCOPY     CORONARY ARTERY BYPASS GRAFT  10/16/2005   LIMA to LAD saphenous vein to RCA   ESOPHAGOGASTRODUODENOSCOPY (EGD) WITH PROPOFOL N/A 05/18/2020   Procedure: ESOPHAGOGASTRODUODENOSCOPY (EGD) WITH PROPOFOL;  Surgeon: Dolores Frame, MD;  [facility];  Service: Gastroenterology;  Laterality: N/A;  1100   FOREIGN BODY REMOVAL Left 01/08/2015   Procedure: EXCISION FOREIGN BODY ;  Surgeon: Cindee Salt, MD;  Location: [facility];  Service: Orthopedics;  Laterality: Left;  ANESTHESIA:  GENERAL, IV REGIONAL UPPER ARM   I & D EXTREMITY Left 11/12/2014   Procedure: IRRIGATION AND DEBRIDEMENT LEFT INDEX FINGER;  Surgeon: Cindee Salt, MD;  Location: [facility];  Service: Orthopedics;  Laterality: Left;   INCISION AND DRAINAGE ABSCESS Left 12/22/2014   Procedure: INCISION AND DRAINAGE ABSCESS LEFT INDEX FINGER;  Surgeon: Cindee Salt, MD;  Location: [facility];  Service: Orthopedics;  Laterality: Left;   PERCUTANEOUS STENT INTERVENTION Left 08/09/2012   Procedure: PERCUTANEOUS STENT INTERVENTION;  Surgeon: Sherren Kerns, MD;  [facility];  Service: Cardiovascular;  Laterality: Left;  stent to lt renal artery   POLYPECTOMY  05/18/2020   Procedure: POLYPECTOMY;  Surgeon: Marguerita Merles, Reuel Boom, MD;  [facility];  Service: Gastroenterology;;  small bowel   RENAL ANGIOGRAM N/A 08/09/2012   Procedure: RENAL ANGIOGRAM;  Surgeon: Sherren Kerns, MD;  Location: [facility] CATH LAB;  Service: Cardiovascular;  Laterality: N/A;    Social History   Socioeconomic History   Marital status: Married    Spouse name: Verlon Au   Number of children: 1 s   Years of education: Not on file   Highest education level: Not on file  Occupational History   Not on file  Tobacco Use   Smoking  status: Every Day    Packs/day: 0.50    Years: 44.00    Additional pack years: 0.00    Total pack years: 22.00    Types: Cigarettes   Smokeless tobacco: Never  Vaping Use   Vaping Use: Never used  Substance and Sexual Activity   Alcohol use: No    Alcohol/week: 0.0 standard drinks of alcohol   Drug use: No   Sexual activity: Yes  Other Topics Concern   Not on file  Social History Narrative   Not on file   Social Determinants of Health   Financial Resource Strain: Not on file  Food Insecurity: Not on file  Transportation Needs: Not on file  Physical Activity: Not on file  Stress: Not on file  Social Connections: Not on file  Intimate Partner Violence: Not on file    Family History  Problem Relation Age of Onset   Heart disease Father        before age 66   Hypertension Father    Hyperlipidemia Father    Diabetes Father    Heart attack Father    Heart disease Mother    Hypertension Mother    Hyperlipidemia Mother    Anesthesia problems Mother        hx. of post-op N/V   AAA (abdominal aortic aneurysm) Maternal Uncle    Cancer Maternal Uncle    Hyperlipidemia Sister    Colon cancer Paternal Aunt    Esophageal cancer Paternal Uncle    Stomach cancer Neg Hx    Pancreatic cancer Neg Hx    Liver disease Neg Hx     Current Outpatient Medications  Medication Sig Dispense Refill   acetaminophen (TYLENOL) 500 MG tablet Take 500-1,000 mg by mouth every 6 (six) hours as needed (for pain.).     alprazolam (XANAX) 2 MG tablet Take 2 mg by mouth at bedtime.     atorvastatin (LIPITOR) 40 MG tablet Take 1 tablet (40 mg total) by mouth daily. 90 tablet 3   diphenhydrAMINE (BENADRYL) 25 MG tablet Take 25 mg by mouth every 6 (six) hours as needed for allergies.     dronedarone (MULTAQ) 400 MG tablet Take 1 tablet (400 mg total) by mouth daily. Please keep upcoming appt.with Dr. Elease Hashimoto in July to receive future refills. 90 tablet 0   metoprolol succinate (TOPROL-XL) 50 MG 24 hr  tablet TAKE 1 TABLET EVERY DAY  WITH  OR  IMMEDIATELY  FOLLOWING A MEAL 90 tablet 0   nitroGLYCERIN (NITROSTAT) 0.4 MG SL tablet Place under the tongue.     olmesartan (BENICAR) 40 MG tablet Take 1 tablet by mouth daily.     rivaroxaban (XARELTO) 20 MG TABS tablet TAKE 1 TABLET EVERY DAY WITH SUPPER 90 tablet 2   cholecalciferol (VITAMIN D) 25 MCG (1000 UNIT) tablet Take 2,000 Units by mouth daily. (Patient not  taking: Reported on 11/18/2021)     famotidine (PEPCID) 20 MG tablet Take 20 mg by mouth as needed. (Patient not taking: Reported on 11/18/2021)     SODIUM FLUORIDE 5000 PPM 1.1 % PSTE Place 1 application onto teeth at bedtime.  (Patient not taking: Reported on 01/25/2021)     Zinc 50 MG CAPS Take 50 mg by mouth daily. (Patient not taking: Reported on 01/25/2021)     No current facility-administered medications for this visit.    Allergies  Allergen Reactions   Niacin And Related Hives   Codeine Other (See Comments)    GI upset ONLY in the cough syrup      REVIEW OF SYSTEMS:   [X]  denotes positive finding, [ ]  denotes negative finding Cardiac  Comments:  Chest pain or chest pressure:    Shortness of breath upon exertion:    Short of breath when lying flat:    Irregular heart rhythm:        Vascular    Pain in calf, thigh, or hip brought on by ambulation:    Pain in feet at night that wakes you up from your sleep:     Blood clot in your veins:    Leg swelling:         Pulmonary    Oxygen at home:    Productive cough:     Wheezing:         Neurologic    Sudden weakness in arms or legs:     Sudden numbness in arms or legs:     Sudden onset of difficulty speaking or slurred speech:    Temporary loss of vision in one eye:     Problems with dizziness:         Gastrointestinal    Blood in stool:     Vomited blood:         Genitourinary    Burning when urinating:     Blood in urine:        Psychiatric    Major depression:         Hematologic    Bleeding problems:     Problems with blood clotting too easily:        Skin    Rashes or ulcers:        Constitutional    Fever or chills:      PHYSICAL EXAMINATION:  Vitals:   02/09/23 0921 02/09/23 0925  BP: (!) 147/69 (!) 164/91  Pulse: (!) 46 (!) 48  Resp: 16   Temp: 97.6 F (36.4 C)   TempSrc: Temporal   SpO2: 95%   Weight: 165 lb (74.8 kg)   Height: 5\' 10"  (1.778 m)     General:  WDWN in NAD; vital signs documented above Gait: Not observed HENT: WNL, normocephalic Pulmonary: normal non-labored breathing , without Rales, rhonchi,  wheezing Cardiac: regular HR Abdomen: soft, NT, no masses Skin: without rashes Vascular Exam/Pulses: symmetric DP pulses Extremities: without ischemic changes, without Gangrene , without cellulitis; without open wounds;  Musculoskeletal: no muscle wasting or atrophy  Neurologic: A&O X 3; CN grossly intact Psychiatric:  The pt has Normal affect.   Non-Invasive Vascular Imaging:   1 to 39% stenosis of bilateral internal carotid arteries  Left renal artery stent patent  AAA sac measures 3.7 cm which has slightly increased from 3.5 cm 1 year ago; no endoleak's noted  ASSESSMENT/PLAN:: 72 y.o. male here for follow up for surveillance of carotid artery stenosis, repaired AAA, and renal  artery stent  -Subjectively, has not had any neurological events since last office visit 1 year ago.  Carotid duplex demonstrates 1 to 39% stenosis of bilateral internal carotid arteries.  We will repeat carotid duplex in 1 year -Left renal artery stent patent by duplex.  Renal function stable and he has not noticed any uncontrolled hypertension.  Repeat renal artery duplex in 1 year -AAA sac mostly stable measuring 3.7 cm with a slight increase from 3.5 cm 1 year ago.  No endoleak's noted on duplex.  Repeat EVAR duplex in 1 year.   Emilie Rutter, PA-C [facility] [phone]  Clinic MD:  Myra Gianotti

## 2023-02-13 ENCOUNTER — Encounter: Payer: Self-pay | Admitting: Physician Assistant

## 2023-02-17 ENCOUNTER — Other Ambulatory Visit: Payer: Self-pay

## 2023-02-17 DIAGNOSIS — I714 Abdominal aortic aneurysm, without rupture, unspecified: Secondary | ICD-10-CM

## 2023-02-17 DIAGNOSIS — I6523 Occlusion and stenosis of bilateral carotid arteries: Secondary | ICD-10-CM

## 2023-02-17 DIAGNOSIS — I701 Atherosclerosis of renal artery: Secondary | ICD-10-CM

## 2023-03-11 ENCOUNTER — Encounter: Payer: Self-pay | Admitting: Cardiovascular Disease

## 2023-03-11 NOTE — Progress Notes (Unsigned)
Tom [name] Date of Birth  December 18, 1950 [facility] [address]    [address] [place], [place]  [address] [phone]  Fax  [phone]  Problem list: 1. Coronary artery disease-status post CABG. February 2007. He had a LIMA  to the LAD and a SVG to the right coronary artery. 2. Hypertension 3. Hyperlipidemia 4. Renal insufficiency 5. Peripheral Vascular disease:  S/p AAA stent graft repair ( Oct. 2013)  s/p renal artery stenting  ( Dec. 2013) 6. Paroxysmal atrial fibrillation:      72 y.o. year-old gentleman with a history of coronary disease. He status post coronary artery bypass grafting in February 2007. He had a left internal mammary artery to the LAD and a saphenous vein graft to the right coronary artery. He also has a history of hypercholesterolemia, hypertension, and ongoing cigarette smoking. He's not had any episodes of chest pain, shortness breath, syncope, or presyncope.  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 He does not do any regular aerobic exercise. He's not had any recurrent episodes of chest pain. He tries to watch his salt intake.  He still smokes about a half a pack of cigarettes a day but has cut back from 2 packs a day. 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  November 22, 2012:  He has had an AAA stent graft repair and renal artery stenting since I last saw him.  He has had several episodes of chest pressure / tightness.  He found his BP and Hr  to be elevated.  He took and extra Toprol and ASA and felt better within an hour.  Oct. 24, 2014:  [name] is having CP and palpitations .  Some relief with Inderal but occasionally has to take some NTG.  Symptoms seen to occur more at night - not during the day while he is working.  stil smoking 1/2 ppd.  Nov. 21, 2014:  [name] is doing ok.  He was diagnosed with atrial fib by event monitor.  He has had several episodes of PAF - resolved after several  propranolol  Jan 02, 2014:  He is doing well.  Still have PAF - probably once a month.  Will last 4-6 hours.   He does not drink ETOH.   still smoking some .  Works as a Education administrator.   Oct. 24, 2016:  No CP ,  No recent episodes of PAF since starting the Multaq.  Was started by Dr. Ladona Ridgel in Oct. 2015 Has not been taking his amlodipine due to leg swelling . Doing well.   BP is typcially ok  Getting some exercise Yard work  Retired from painting    Oct. 24, 2017:  Tom [name] is doing well.  Thinks he forgot his BP meds today .  Typical BP is well controlled.  Hs PAF - was started on Multaq.   Oct. 25, 2018:  Has lost 25 lbs. - purposeful weight loss Started cutting back on his BP meds Stopped Valsartan when it was recalled.  Has had orthostasis - once while using a chain saw. Several episodes of orthostasis getting up to the BR at night .   No CP , no dyspnea No syncope since adjusting his meds.  Cut his toprol XL to 50  Changed valsartan to Losartan  Has not had any a-fib since being on Multaq  Oct. 31, 2019:  Tom [name] is seen today for follow up of his CAD,  HTN,  HLD   and PAF .   Wt. Is 168-  Had an episode of leg swelling on 1 occasion. Is on Multaq - his primary MD gave him some HCTZ to take on occasion .  Seems to be related to being on his feet.  Takes the HCTZ maybe 1-2 times a week .  Thinks he may be able to get eliquis cheaper than Xarelto .  Has been taking the Multaq once a day due to cost  Encouraged him to see Dr. Ladona Ridgel again if he wants to switch .   Jan. 8, 2021: Tom [name] is seen today for follow-up visit.  He has a history of coronary artery disease and is status post coronary artery bypass grafting.  He has a history of paroxysmal atrial fibrillation. No recent episodes of AF. Is active around the house - cuttng trees,  Also keeping up his mother house.   Feb. 1, 2022: Tom [name] is seen today for follow up of his CAD, PAF No exercise per se Walks,  hunts Doing some painting  Splits firewood  Still smoking ,  Ive recommended that he stop smoking   December 06, 2021 Tom [name] is seen for follow up of his CAd, PAF, HLD  No cp, no dyspnea  Still smoking some ,    March 12, 2023 Tom [name] is seen for follow up of his CAD, PAF, HLD Still smoking - very little  No cp, no dyspnea  Perhaps 1 episode of PAF  Has not needed any NTG  Follows with [facility] for his AAA, carotids and renal artery stenosis   Current Outpatient Medications on File Prior to Visit  Medication Sig Dispense Refill   acetaminophen (TYLENOL) 500 MG tablet Take 500-1,000 mg by mouth every 6 (six) hours as needed (for pain.).     alprazolam (XANAX) 2 MG tablet Take 2 mg by mouth at bedtime.     atorvastatin (LIPITOR) 40 MG tablet Take 1 tablet (40 mg total) by mouth daily. 90 tablet 3   diphenhydrAMINE (BENADRYL) 25 MG tablet Take 25 mg by mouth every 6 (six) hours as needed for allergies.     dronedarone (MULTAQ) 400 MG tablet Take 1 tablet (400 mg total) by mouth daily. Please keep upcoming appt.with Dr. Elease Hashimoto in July to receive future refills. 90 tablet 0   metoprolol succinate (TOPROL-XL) 50 MG 24 hr tablet TAKE 1 TABLET EVERY DAY  WITH  OR  IMMEDIATELY  FOLLOWING A MEAL 90 tablet 0   nitroGLYCERIN (NITROSTAT) 0.4 MG SL tablet Place under the tongue.     olmesartan (BENICAR) 40 MG tablet Take 1 tablet by mouth daily.     rivaroxaban (XARELTO) 20 MG TABS tablet TAKE 1 TABLET EVERY DAY WITH SUPPER 90 tablet 2   No current facility-administered medications on file prior to visit.    Allergies  Allergen Reactions   Niacin And Related Hives   Codeine Other (See Comments)    GI upset ONLY in the cough syrup     Past Medical History:  Diagnosis Date   AAA (abdominal aortic aneurysm) (HCC)    Anxiety    Anxiety    Atrial fibrillation (HCC) 07/11/2013   CAD (coronary artery disease) 01/27/2011   Cancer (HCC) 2013   skin cancer,   MOHS surgery   Chronic pain of left knee  02/05/2018   Coronary artery disease    Dental bridge present    upper   Dental crowns present    Diarrhea 06/17/2010  Qualifier: Diagnosis of  By: Jena Gauss MD, Caleen Essex, R. Casimiro Needle     Family history of adverse reaction to anesthesia    pt's mother has hx. of post-op N/V   Foreign body of finger of left hand 12/2014   index finger   GERD (gastroesophageal reflux disease)    Hypercholesteremia    Hyperlipidemia    Hypertension    states under control with med., has been on med. x 20 yr.   Hyponatremia 10/10/2018   Myocardial infarction (HCC) 1997   PAF (paroxysmal atrial fibrillation) (HCC)    Syncope 10/10/2018   Tobacco abuse 01/27/2011   Wears glasses     Past Surgical History:  Procedure Laterality Date   ABDOMINAL AORTIC ANEURYSM REPAIR  06/12/2012   BIOPSY  05/18/2020   Procedure: BIOPSY;  Surgeon: Dolores Frame, MD;  [facility];  Service: Gastroenterology;;  duodenum   CARDIAC CATHETERIZATION  10/03/2005   COLONOSCOPY     CORONARY ARTERY BYPASS GRAFT  10/16/2005   LIMA to LAD saphenous vein to RCA   ESOPHAGOGASTRODUODENOSCOPY (EGD) WITH PROPOFOL N/A 05/18/2020   Procedure: ESOPHAGOGASTRODUODENOSCOPY (EGD) WITH PROPOFOL;  Surgeon: Dolores Frame, MD;  [facility];  Service: Gastroenterology;  Laterality: N/A;  1100   FOREIGN BODY REMOVAL Left 01/08/2015   Procedure: EXCISION FOREIGN BODY ;  Surgeon: Cindee Salt, MD;  Location: [facility];  Service: Orthopedics;  Laterality: Left;  ANESTHESIA:  GENERAL, IV REGIONAL UPPER ARM   I & D EXTREMITY Left 11/12/2014   Procedure: IRRIGATION AND DEBRIDEMENT LEFT INDEX FINGER;  Surgeon: Cindee Salt, MD;  Location: [facility];  Service: Orthopedics;  Laterality: Left;   INCISION AND DRAINAGE ABSCESS Left 12/22/2014   Procedure: INCISION AND DRAINAGE ABSCESS LEFT INDEX FINGER;  Surgeon: Cindee Salt, MD;  Location:  SURGERY CENTER;  Service: Orthopedics;  Laterality:  Left;   PERCUTANEOUS STENT INTERVENTION Left 08/09/2012   Procedure: PERCUTANEOUS STENT INTERVENTION;  Surgeon: Sherren Kerns, MD;  Location: [facility] C[facility];  Service: Cardiovascular;  Laterality: Left;  stent to lt renal artery   POLYPECTOMY  05/18/2020   Procedure: POLYPECTOMY;  Surgeon: Dolores Frame, MD;  [facility];  Service: Gastroenterology;;  small bowel   RENAL ANGIOGRAM N/A 08/09/2012   Procedure: RENAL ANGIOGRAM;  Surgeon: Sherren Kerns, MD;  Location: [facility] CATH LAB;  Service: Cardiovascular;  Laterality: N/A;    Social History   Tobacco Use  Smoking Status Every Day   Current packs/day: 0.50   Average packs/day: 0.5 packs/day for 44.0 years (22.0 ttl pk-yrs)   Types: Cigarettes  Smokeless Tobacco Never    Social History   Substance and Sexual Activity  Alcohol Use No   Alcohol/week: 0.0 standard drinks of alcohol    Family History  Problem Relation Age of Onset   Heart disease Father        before age 39   Hypertension Father    Hyperlipidemia Father    Diabetes Father    Heart attack Father    Heart disease Mother    Hypertension Mother    Hyperlipidemia Mother    Anesthesia problems Mother        hx. of post-op N/V   AAA (abdominal aortic aneurysm) Maternal Uncle    Cancer Maternal Uncle    Hyperlipidemia Sister    Colon cancer Paternal Aunt    Esophageal cancer Paternal Uncle    Stomach cancer Neg Hx    Pancreatic  cancer Neg Hx    Liver disease Neg Hx     Reviw of Systems:  Reviewed in the HPI.  All other systems are negative.  Physical Exam: Blood pressure 114/70, pulse (!) 54, height 5\' 10"  (1.778 m), weight 165 lb 9.6 oz (75.1 kg), SpO2 95%.       GEN:  Well nourished, well developed in no acute distress HEENT: Normal NECK: No JVD; No carotid bruits LYMPHATICS: No lymphadenopathy CARDIAC: RRR , very soft systolic murmur  RESPIRATORY:  Clear to auscultation without rales, wheezing or rhonchi  ABDOMEN: Soft,  non-tender, non-distended MUSCULOSKELETAL:  No edema; No deformity  SKIN: Warm and dry NEUROLOGIC:  Alert and oriented x 3    ECG:   EKG Interpretation Date/Time:  Tuesday March 13 2023 08:00:32 EDT Ventricular Rate:  54 PR Interval:  160 QRS Duration:  98 QT Interval:  446 QTC Calculation: 422 R Axis:   73  Text Interpretation: Sinus bradycardia When compared with ECG of 09-Oct-2018 21:54, No significant change since last tracing Confirmed by Kristeen Miss ([id number]) on 03/13/2023 8:19:47 AM     Assessment / Plan:   1. Coronary artery disease-status post CABG.    No angina   2. Hypertension  :    BP looks great , continue benicar,  BMP today     3. Hyperlipidemia:  cont atorvastatin,  check lipids   4. Renal insufficiency  5. Peripheral Vascular disease:    followed by [facility]   6. Paroxysmal atrial fibrillation:      rare episodes of PAF,  cont multaq. Continue xarelto Check CBC today      Kristeen Miss, MD  03/13/2023 8:51 AM    [facility] [address] [place],  [address] [place], [place]  [address] Pager [phone] Phone: [phone]; Fax: [phone]

## 2023-03-13 ENCOUNTER — Ambulatory Visit: Payer: Medicare HMO | Attending: Cardiovascular Disease | Admitting: Cardiovascular Disease

## 2023-03-13 ENCOUNTER — Encounter: Payer: Self-pay | Admitting: Cardiovascular Disease

## 2023-03-13 VITALS — BP 114/70 | HR 54 | Ht 70.0 in | Wt 165.6 lb

## 2023-03-13 DIAGNOSIS — I10 Essential (primary) hypertension: Secondary | ICD-10-CM

## 2023-03-13 DIAGNOSIS — I4891 Unspecified atrial fibrillation: Secondary | ICD-10-CM

## 2023-03-13 DIAGNOSIS — I2510 Atherosclerotic heart disease of native coronary artery without angina pectoris: Secondary | ICD-10-CM | POA: Diagnosis not present

## 2023-03-13 DIAGNOSIS — E7800 Pure hypercholesterolemia, unspecified: Secondary | ICD-10-CM

## 2023-03-13 NOTE — Patient Instructions (Signed)
Medication Instructions:  Your physician recommends that you continue on your current medications as directed. Please refer to the Current Medication list given to you today.  *If you need a refill on your cardiac medications before your next appointment, please call your pharmacy*   Lab Work: Lipids, BMET, ALT, CBC today If you have labs (blood work) drawn today and your tests are completely normal, you will receive your results only by: MyChart Message (if you have MyChart) OR A paper copy in the mail If you have any lab test that is abnormal or we need to change your treatment, we will call you to review the results.   Testing/Procedures: NONE   Follow-Up: At [facility], you and your health needs are our priority.  As part of our continuing mission to provide you with exceptional heart care, we have created designated Provider Care Teams.  These Care Teams include your primary Cardiologist (physician) and Advanced Practice Providers (APPs -  Physician Assistants and Nurse Practitioners) who all work together to provide you with the care you need, when you need it.  We recommend signing up for the patient portal called "MyChart".  Sign up information is provided on this After Visit Summary.  MyChart is used to connect with patients for Virtual Visits (Telemedicine).  Patients are able to view lab/test results, encounter notes, upcoming appointments, etc.  Non-urgent messages can be sent to your provider as well.   To learn more about what you can do with MyChart, go to [email or web address].    Your next appointment:   1 year(s)  Provider:   Kristeen Miss, MD

## 2023-03-14 LAB — CBC
Hematocrit: 44.4 % (ref 37.5–51.0)
Hemoglobin: 15.1 g/dL (ref 13.0–17.7)
MCH: 30.4 pg (ref 26.6–33.0)
MCHC: 34 g/dL (ref 31.5–35.7)
MCV: 90 fL (ref 79–97)
Platelets: 298 10*3/uL (ref 150–450)
RBC: 4.96 x10E6/uL (ref 4.14–5.80)
RDW: 13.1 % (ref 11.6–15.4)
WBC: 10.8 10*3/uL (ref 3.4–10.8)

## 2023-03-14 LAB — LIPID PANEL
Chol/HDL Ratio: 3.2 ratio (ref 0.0–5.0)
Cholesterol, Total: 101 mg/dL (ref 100–199)
HDL: 32 mg/dL — ABNORMAL LOW (ref >39)
LDL Chol Calc (NIH): 51 mg/dL (ref 0–99)
Triglycerides: 92 mg/dL (ref 0–149)
VLDL Cholesterol Cal: 18 mg/dL (ref 5–40)

## 2023-03-14 LAB — BASIC METABOLIC PANEL
BUN/Creatinine Ratio: 8 — ABNORMAL LOW (ref 10–24)
BUN: 10 mg/dL (ref 8–27)
CO2: 25 mmol/L (ref 20–29)
Calcium: 9.4 mg/dL (ref 8.6–10.2)
Chloride: 97 mmol/L (ref 96–106)
Creatinine, Ser: 1.25 mg/dL (ref 0.76–1.27)
Glucose: 93 mg/dL (ref 70–99)
Potassium: 4.8 mmol/L (ref 3.5–5.2)
Sodium: 130 mmol/L — ABNORMAL LOW (ref 134–144)
eGFR: 62 mL/min/{1.73_m2} (ref >59)

## 2023-03-14 LAB — ALT: ALT: 21 IU/L (ref 0–44)

## 2023-04-16 ENCOUNTER — Other Ambulatory Visit: Payer: Self-pay | Admitting: Cardiovascular Disease

## 2023-04-18 MED ORDER — MULTAQ 400 MG PO TABS: 400.0000 mg | ORAL_TABLET | Freq: Every day | ORAL | 3 refills | Status: DC

## 2023-06-19 DIAGNOSIS — H2513 Age-related nuclear cataract, bilateral: Secondary | ICD-10-CM | POA: Diagnosis not present

## 2023-06-19 DIAGNOSIS — D3131 Benign neoplasm of right choroid: Secondary | ICD-10-CM | POA: Diagnosis not present

## 2023-06-19 DIAGNOSIS — H5203 Hypermetropia, bilateral: Secondary | ICD-10-CM | POA: Diagnosis not present

## 2023-07-27 DIAGNOSIS — I480 Paroxysmal atrial fibrillation: Secondary | ICD-10-CM | POA: Diagnosis not present

## 2023-07-27 DIAGNOSIS — I10 Essential (primary) hypertension: Secondary | ICD-10-CM | POA: Diagnosis not present

## 2023-07-27 DIAGNOSIS — Z23 Encounter for immunization: Secondary | ICD-10-CM | POA: Diagnosis not present

## 2023-07-27 DIAGNOSIS — Q271 Congenital renal artery stenosis: Secondary | ICD-10-CM | POA: Diagnosis not present

## 2023-09-06 ENCOUNTER — Telehealth: Payer: Self-pay | Admitting: Cardiovascular Disease

## 2023-09-06 DIAGNOSIS — I4891 Unspecified atrial fibrillation: Secondary | ICD-10-CM

## 2023-09-06 MED ORDER — RIVAROXABAN 20 MG PO TABS
ORAL_TABLET | ORAL | 1 refills | Status: DC
Start: 2023-09-06 — End: 2023-09-11

## 2023-09-06 NOTE — Telephone Encounter (Signed)
*  STAT* If patient is at the pharmacy, call can be transferred to refill team.   1. Which medications need to be refilled? (please list name of each medication and dose if known)   rivaroxaban (XARELTO) 20 MG TABS tablet   2. Would you like to learn more about the convenience, safety, & potential cost savings by using the [facility]?   3. Are you open to using the [facility] (Type [facility]. ).  4. Which pharmacy/location (including street and city if local pharmacy) is medication to be sent to?  [facility] - [place], [place] - [facility] AT Portal to Registered [facility] Sites   5. Do they need a 30 day or 90 day supply?  90 day  Patient stated he still has some of this medication.

## 2023-09-06 NOTE — Telephone Encounter (Signed)
Prescription refill request for Xarelto received.  Indication:Afib  Last office visit: 03/13/23 (Nahser)  Weight: 75.1kg Age: 73 Scr: 1.25 (03/13/23)   CrCl:  57.68ml/min  Appropriate dose. Refill sent.

## 2023-09-06 NOTE — Telephone Encounter (Signed)
Pt c/o medication issue:  1. Name of Medication:   dronedarone (MULTAQ) 400 MG tablet   2. How are you currently taking this medication (dosage and times per day)?   As prescribed  3. Are you having a reaction (difficulty breathing--STAT)?   No  4. What is your medication issue?   Patient stated he wants to go back to taking this medication twice daily as he has been having increased Afib.

## 2023-09-06 NOTE — Telephone Encounter (Signed)
Returned call to patient who states 2-3 months ago he started noticing that he was having more episodes of A-fib. He had cut down from twice daily to once daily due to cost and was supplementing an extra tablet if needed and this had been working. States [name] told him that at some point he would likely need to go back to twice daily. He's having episodes once to twice weekly now and says they are bad when they occur. He feels like he needs to go ahead and get back on twice daily dosing to get it under better control. Routing to [employer] for Music therapist.(Initially prescribed by Ladona Ridgel)

## 2023-09-10 ENCOUNTER — Encounter (HOSPITAL_COMMUNITY): Payer: Self-pay

## 2023-09-10 NOTE — Telephone Encounter (Signed)
Lm to discuss recommendations.

## 2023-09-11 ENCOUNTER — Telehealth: Payer: Self-pay | Admitting: Pharmacist

## 2023-09-11 ENCOUNTER — Other Ambulatory Visit: Payer: Self-pay

## 2023-09-11 DIAGNOSIS — I4891 Unspecified atrial fibrillation: Secondary | ICD-10-CM

## 2023-09-11 MED ORDER — RIVAROXABAN 20 MG PO TABS
ORAL_TABLET | ORAL | 1 refills | Status: DC
Start: 2023-09-11 — End: 2023-09-19

## 2023-09-11 MED ORDER — RIVAROXABAN 20 MG PO TABS
ORAL_TABLET | ORAL | 1 refills | Status: DC
Start: 2023-09-11 — End: 2023-09-11

## 2023-09-11 NOTE — Telephone Encounter (Signed)
Received fax notification Xarelto not available from mail order pharmacy. Contacted patient and [facility] [place] Rx sent

## 2023-09-12 MED ORDER — DRONEDARONE HCL 400 MG PO TABS: 400.0000 mg | ORAL_TABLET | Freq: Two times a day (BID) | ORAL | 3 refills | Status: AC

## 2023-09-12 NOTE — Telephone Encounter (Signed)
Called and spoke with patient who states he has increased it to twice daily and has not had any issues. He feels like it came about because he had decreased it to once daily due to cost. He states he was told he would need to go back to twice daily at some point. He can afford it now. Request new rx be sent to mail order for twice daily dosing-done at this time. He doesn't want to see A-fib clinic unless he starts having issues and will call us if he does.  Nahser, Deloris Ping, MD sent to Lars Mage, RN; Marinus Maw, MD; Danice Goltz, PA Caller: Unspecified (6 days ago, 11:30 AM) Champ is having more frequent episodes of PAF.   He is taking Multaq 1-2 times a day . He is on Xarelto as prescribed I think he would benefit from taking the multaq BID I will defer to Dr. Ladona Ridgel and the EP team for any additional suggestions Please have him see the Afib clinic for further management PN

## 2023-09-17 NOTE — Telephone Encounter (Signed)
The recommended dosage for multaq is 400 mg twice daily taken with food. That is my recommendation.

## 2023-09-19 ENCOUNTER — Other Ambulatory Visit: Payer: Self-pay | Admitting: *Deleted

## 2023-09-19 DIAGNOSIS — I4891 Unspecified atrial fibrillation: Secondary | ICD-10-CM

## 2023-09-19 MED ORDER — RIVAROXABAN 20 MG PO TABS
ORAL_TABLET | ORAL | 1 refills | Status: DC
Start: 2023-09-19 — End: 2024-03-03

## 2023-09-19 NOTE — Telephone Encounter (Signed)
Xarelto 20mg  refill request received. Pt is 73 years old, weight-75.1kg, Crea-1.25 on 03/13/23, last seen by Dr. Elease Hashimoto on 03/13/23, Diagnosis-Afib, CrCl-57.58 mL/min; Dose is appropriate based on dosing criteria. Will send in refill to requested pharmacy.

## 2024-01-28 ENCOUNTER — Telehealth: Payer: Self-pay | Admitting: Cardiovascular Disease

## 2024-01-28 NOTE — Telephone Encounter (Signed)
 Calling to see Dr. Alroy Aspen would recommend him to start seeing. Please advise

## 2024-01-28 NOTE — Telephone Encounter (Signed)
 Will route this message to Dr. Alroy Aspen and covering RN, for further advisement and follow-up with the pt on reassignment of new Cardiologist.

## 2024-01-29 NOTE — Telephone Encounter (Signed)
 Delawrence, Fridman - 01/28/2024 11:58 AM Nahser, Lela Purple, MD  Sent: Tue January 29, 2024  1:58 PM  To: Peggi Bowels, LPN; Annamaria Kicks A         Message  His yearly office visit is due on July    He can see whoever has the next available slot in July / aug.    Hopefully Royston Cornea has a plan for distributing these patients.  I have assigned some , but most will just have to be divided amongst the other partners      PN     Will forward this message to our scheduling team/Leads, to follow-up with the pt as advised above by Dr. Alroy Aspen, to arrange his next follow-up appt with a new [facility] to establish with.

## 2024-03-03 ENCOUNTER — Other Ambulatory Visit: Payer: Self-pay | Admitting: Cardiovascular Disease

## 2024-03-03 DIAGNOSIS — I4891 Unspecified atrial fibrillation: Secondary | ICD-10-CM

## 2024-03-03 NOTE — Telephone Encounter (Signed)
 Prescription refill request for Xarelto  received.  Indication: PAF Last office visit: 03/13/23  SHAUNNA Passe MD  (Appt 03/12/34 Dr Michele) Weight: 75.1kg Age: 73 Scr: 1.25 on 03/13/23  Epic CrCl: 57.58  Based on above findings Xarelto  20mg  daily is the appropriate dose.  Refill approved.

## 2024-03-12 ENCOUNTER — Ambulatory Visit: Admitting: Cardiology

## 2024-03-13 ENCOUNTER — Other Ambulatory Visit: Payer: Self-pay | Admitting: *Deleted

## 2024-03-13 DIAGNOSIS — I6523 Occlusion and stenosis of bilateral carotid arteries: Secondary | ICD-10-CM

## 2024-03-13 DIAGNOSIS — I701 Atherosclerosis of renal artery: Secondary | ICD-10-CM

## 2024-03-13 DIAGNOSIS — I6529 Occlusion and stenosis of unspecified carotid artery: Secondary | ICD-10-CM

## 2024-03-18 ENCOUNTER — Ambulatory Visit: Attending: Internal Medicine | Admitting: Cardiology

## 2024-03-18 ENCOUNTER — Other Ambulatory Visit (HOSPITAL_COMMUNITY): Payer: Self-pay

## 2024-03-18 ENCOUNTER — Encounter: Payer: Self-pay | Admitting: Cardiology

## 2024-03-18 VITALS — BP 138/68 | HR 48 | Resp 16 | Ht 70.0 in | Wt 163.0 lb

## 2024-03-18 DIAGNOSIS — Z951 Presence of aortocoronary bypass graft: Secondary | ICD-10-CM | POA: Diagnosis not present

## 2024-03-18 DIAGNOSIS — Z9889 Other specified postprocedural states: Secondary | ICD-10-CM

## 2024-03-18 DIAGNOSIS — I4819 Other persistent atrial fibrillation: Secondary | ICD-10-CM | POA: Diagnosis not present

## 2024-03-18 DIAGNOSIS — E7800 Pure hypercholesterolemia, unspecified: Secondary | ICD-10-CM | POA: Diagnosis not present

## 2024-03-18 DIAGNOSIS — I2510 Atherosclerotic heart disease of native coronary artery without angina pectoris: Secondary | ICD-10-CM

## 2024-03-18 DIAGNOSIS — Z7901 Long term (current) use of anticoagulants: Secondary | ICD-10-CM | POA: Diagnosis not present

## 2024-03-18 DIAGNOSIS — F17210 Nicotine dependence, cigarettes, uncomplicated: Secondary | ICD-10-CM

## 2024-03-18 DIAGNOSIS — Z8679 Personal history of other diseases of the circulatory system: Secondary | ICD-10-CM

## 2024-03-18 DIAGNOSIS — Z79899 Other long term (current) drug therapy: Secondary | ICD-10-CM | POA: Diagnosis not present

## 2024-03-18 DIAGNOSIS — I10 Essential (primary) hypertension: Secondary | ICD-10-CM

## 2024-03-18 LAB — LIPID PANEL

## 2024-03-18 MED ORDER — RIVAROXABAN 20 MG PO TABS
20.0000 mg | ORAL_TABLET | Freq: Every day | ORAL | 3 refills | Status: DC
Start: 2024-03-18 — End: 2024-05-22
  Filled 2024-03-18: qty 90, 90d supply, fill #0

## 2024-03-18 MED ORDER — AMLODIPINE BESYLATE 5 MG PO TABS
5.0000 mg | ORAL_TABLET | Freq: Every day | ORAL | 3 refills | Status: AC
Start: 2024-03-18 — End: ?
  Filled 2024-03-18: qty 30, 30d supply, fill #0
  Filled 2024-05-29: qty 30, 30d supply, fill #1

## 2024-03-18 NOTE — Patient Instructions (Signed)
 Medication Instructions:  START Amlodipine  5 mg once daily in the evening  Refill for Xarelto  has been sent to our [facility] on the first floor.  *If you need a refill on your cardiac medications before your next appointment, please call your pharmacy*  Lab Work: To be completed today: Hemoglobin/ hematocrit, FASTING lipid panel, and CMP  If you have labs (blood work) drawn today and your tests are completely normal, you will receive your results only by: MyChart Message (if you have MyChart) OR A paper copy in the mail If you have any lab test that is abnormal or we need to change your treatment, we will call you to review the results.  Testing/Procedures: Your physician has requested that you have an echocardiogram to be completed in 1 year prior to follow-up with Dr. Michele. Echocardiography is a painless test that uses sound waves to create images of your heart. It provides your doctor with information about the size and shape of your heart and how well your heart's chambers and valves are working. This procedure takes approximately one hour. There are no restrictions for this procedure. Please do NOT wear cologne, perfume, aftershave, or lotions (deodorant is allowed). Please arrive 15 minutes prior to your appointment time.  Please note: We ask at that you not bring children with you during ultrasound (echo/ vascular) testing. Due to room size and safety concerns, children are not allowed in the ultrasound rooms during exams. Our front office staff cannot provide observation of children in our lobby area while testing is being conducted. An adult accompanying a patient to their appointment will only be allowed in the ultrasound room at the discretion of the ultrasound technician under special circumstances. We apologize for any inconvenience.   Follow-Up: At [facility], you and your health needs are our priority.  As part of our continuing mission to provide  you with exceptional heart care, our providers are all part of one team.  This team includes your primary Cardiologist (physician) and Advanced Practice Providers or APPs (Physician Assistants and Nurse Practitioners) who all work together to provide you with the care you need, when you need it.  Your next appointment:   1 year(s)  Provider:   Madonna Michele, DO

## 2024-03-18 NOTE — Progress Notes (Signed)
 Cardiology Office Note:  .   Date:  03/18/2024  ID:  Tom [name], DOB 10-26-1950, MRN [id number] PCP:  Sheryle Carwin, MD  Former Cardiology Providers: DR. ALEENE PASSE [facility] Providers Cardiologist:  Madonna Large, DO , [facility] (established care 03/18/24) Electrophysiologist:  None  Click to update primary MD,subspecialty MD or APP then REFRESH:1}    Chief Complaint  Patient presents with   Atrial Fibrillation   coronary artery of native heart without angina pectoris   Follow-up    History of Present Illness: .   Tom [name] is a 73 y.o. Caucasian male whose past medical history and cardiovascular risk factors includes: Coronary artery disease status post CABG (LIMA to the LAD SVG to RCA, February 2007), hypertension, hyperlipidemia, renal insufficiency, status post AAA stent graft repair (October 2013) status post renal artery stenting (December 2013), persistent atrial fibrillation, cigarette smoking.  Formally under the care of DR. PHILIP NAHSER who last saw Tom [name] back in July 2024. I am seeing him for the first time to re-establishing care.   Since last office visit patient denies any anginal chest pain or heart failure symptoms.  No hospitalizations or urgent care visits for cardiovascular reasons.  He has been compliant with his medical therapy.  Physical endurance remains stable (clean gutter, cut trees, etc). Home SBP ranges between ~160mmHG - . Smokes 0.5ppd (not plans on quitting but has tried).    Review of Systems: .   Review of Systems  Cardiovascular:  Negative for chest pain, claudication, irregular heartbeat, leg swelling, near-syncope, orthopnea, palpitations, paroxysmal nocturnal dyspnea and syncope.  Respiratory:  Negative for shortness of breath.   Hematologic/Lymphatic: Negative for bleeding problem.    Studies Reviewed:   EKG: EKG Interpretation Date/Time:  Tuesday March 18 2024 09:18:54 EDT Ventricular Rate:  46 PR Interval:  158 QRS  Duration:  98 QT Interval:  470 QTC Calculation: 411 R Axis:   86  Text Interpretation: Sinus bradycardia When compared with ECG of 13-Mar-2023 08:00, No significant change was found Confirmed by Large Madonna [phone]) on 03/18/2024 9:24:06 AM  Echocardiogram: 09/2018  1. The left ventricle has normal systolic function with an ejection  fraction of 60-65%. The cavity size was normal. There is mild asymmetric  left ventricular hypertrophy of the basal anteroseptal wall. Left  ventricular diastolic Doppler parameters are  consistent with impaired relaxation No evidence of left ventricular  regional wall motion abnormalities.   2. The right ventricle has normal systolic function. The cavity was  normal. There is no increase in right ventricular wall thickness.   3. The mitral valve is degenerative. Mild thickening of the mitral valve  leaflet. There is moderate mitral annular calcification present.   4. The tricuspid valve is normal in structure.   5. The aortic valve is tricuspid Mild thickening of the aortic valve  Moderate calcification of the aortic valve.   6. The pulmonic valve was normal in structure.   7. No evidence of left ventricular regional wall motion abnormalities.    RADIOLOGY: NA  Risk Assessment/Calculations:   Click Here to Calculate/Change CHADS2VASc Score The patient's CHADS2-VASc score is 3, indicating a 3.2% annual risk of stroke.     Labs:       Latest Ref Rng & Units 03/13/2023    8:24 AM 12/06/2021    8:25 AM 10/09/2018   10:10 PM  CBC  WBC 3.4 - 10.8 x10E3/uL 10.8  9.8  10.2   Hemoglobin 13.0 - 17.7  g/dL 84.8  85.2  86.3   Hematocrit 37.5 - 51.0 % 44.4  42.9  39.6   Platelets 150 - 450 x10E3/uL 298  258  300        Latest Ref Rng & Units 03/13/2023    8:24 AM 12/06/2021    8:25 AM 09/21/2020    8:42 AM  BMP  Glucose 70 - 99 mg/dL 93  77  92   BUN 8 - 27 mg/dL 10  12  12    Creatinine 0.76 - 1.27 mg/dL 8.74  8.56  8.67   BUN/Creat Ratio 10 - 24 8   8  9    Sodium 134 - 144 mmol/L 130  135  133   Potassium 3.5 - 5.2 mmol/L 4.8  4.8  4.7   Chloride 96 - 106 mmol/L 97  100  98   CO2 20 - 29 mmol/L 25  24  25    Calcium  8.6 - 10.2 mg/dL 9.4  9.3  9.1       Latest Ref Rng & Units 03/13/2023    8:24 AM 12/06/2021    8:25 AM 09/21/2020    8:42 AM  CMP  Glucose 70 - 99 mg/dL 93  77  92   BUN 8 - 27 mg/dL 10  12  12    Creatinine 0.76 - 1.27 mg/dL 8.74  8.56  8.67   Sodium 134 - 144 mmol/L 130  135  133   Potassium 3.5 - 5.2 mmol/L 4.8  4.8  4.7   Chloride 96 - 106 mmol/L 97  100  98   CO2 20 - 29 mmol/L 25  24  25    Calcium  8.6 - 10.2 mg/dL 9.4  9.3  9.1   ALT 0 - 44 IU/L 21  15  19      Lab Results  Component Value Date   CHOL 101 03/13/2023   HDL 32 (L) 03/13/2023   LDLCALC 51 03/13/2023   TRIG 92 03/13/2023   CHOLHDL 3.2 03/13/2023   No results for input(s): LIPOA in the last 8760 hours. No components found for: NTPROBNP No results for input(s): PROBNP in the last 8760 hours. No results for input(s): TSH in the last 8760 hours.  Physical Exam:    Today's Vitals   03/18/24 0916  BP: 138/68  Pulse: (!) 48  Resp: 16  SpO2: 96%  Weight: 163 lb (73.9 kg)  Height: 5' 10 (1.778 m)   Body mass index is 23.39 kg/m. Wt Readings from Last 3 Encounters:  03/18/24 163 lb (73.9 kg)  03/13/23 165 lb 9.6 oz (75.1 kg)  02/09/23 165 lb (74.8 kg)    Physical Exam  Constitutional: No distress.  hemodynamically stable  Neck: No JVD present.  Cardiovascular: Regular rhythm, S1 normal and S2 normal. Bradycardia present. Exam reveals no gallop, no S3 and no S4.  No murmur heard. Pulmonary/Chest: Effort normal and breath sounds normal. No stridor. He has no wheezes. He has no rales.  Musculoskeletal:        General: No edema.     Cervical back: Neck supple.  Skin: Skin is warm.     Impression & Recommendation(s):  Impression:   ICD-10-CM   1. Coronary artery disease involving native coronary artery of native heart  without angina pectoris  I25.10 EKG 12-Lead    ECHOCARDIOGRAM COMPLETE    2. Hx of CABG  Z95.1     3. Hypercholesterolemia  E78.00 Comprehensive metabolic panel with GFR    Lipid panel  Lipid panel    Comprehensive metabolic panel with GFR    4. Benign hypertension  I10 amLODipine  (NORVASC ) 5 MG tablet    5. Persistent atrial fibrillation (HCC)  I48.19 rivaroxaban  (XARELTO ) 20 MG TABS tablet    Hemoglobin and hematocrit, blood    Hemoglobin and hematocrit, blood    6. Long term (current) use of anticoagulants  Z79.01 rivaroxaban  (XARELTO ) 20 MG TABS tablet    7. Long term current use of antiarrhythmic drug  Z79.899 rivaroxaban  (XARELTO ) 20 MG TABS tablet    8. S/P AAA repair  Z98.890    Z86.79     9. History of stent insertion of renal artery  Z98.890     10. Smoking 1/2 pack a day or less  F17.210        Recommendation(s):  Coronary artery disease involving native coronary artery of native heart without angina pectoris Hx of CABG Denies anginal chest pain. Overall functional capacity remains stable No use of sublingual nitroglycerin  tablets since the last office visit. EKG nonischemic Last echocardiogram from 2020 reviewed. Plan echocardiogram prior to the next office visit in 2026 Reemphasized the importance of secondary prevention with focus on improving the modifiable cardiovascular risk factors such as glycemic control, lipid management, blood pressure control, smoking cessation  Hypercholesterolemia Currently on Lipitor 40 mg p.o. nightly. Last LDL 51 mg/dL as of July 7975. Patient requesting fasting lipids to be performed today.  Check fasting lipids and CMP.  Benign hypertension Office blood pressures are acceptable. Home blood pressures range between 130-140 mmHg. Given his prior history of AAA would recommend a blood pressure closer to 120-130 mmHg if tolerable. Currently takes Benicar 40 mg p.o. every morning. Will start amlodipine  5 mg p.o. every  afternoon Patient is asked to call the office or PCP if SBP are not well-controlled in the interim.  Persistent atrial fibrillation (HCC) Long term (current) use of anticoagulants Long term current use of antiarrhythmic drug Rate control: Metoprolol . Rhythm control: Multaq . Thromboembolic prophylaxis: Xarelto . Patient requesting refill on Xarelto . Check H&H. Risks, benefits, alternatives to anticoagulation discussed  S/P AAA repair History of stent insertion of renal artery Follows with vascular surgery for management of his AAA, renal artery disease, and carotid disease. Reemphasize importance of complete smoking cessation and secondary prevention.  Cigarette smoking: Tobacco cessation counseling: Currently smoking 0.5 packs/day   Patient denies claudication Patient is informed to follow-up with PCP and consider lung cancer screening if and when appropriate. He is informed of the dangers of tobacco abuse including stroke, cancer, and MI, as well as benefits of tobacco cessation. He is not willing to quit at this time.  Patient states that he used to smoke 2 packs/day and now is down to 0.5 packs/day.  He is trying his best and has tried to stop smoking in the past but has been unsuccessful.  He does not want to be on pharmacological therapy.  Patient states that he has been dipping, chewing, and smoking tobacco since childhood. 5 mins were spent counseling patient cessation techniques. We discussed various methods to help quit smoking, including deciding on a date to quit, joining a support group, pharmacological agents- nicotine gum/patch/lozenges.  I will reassess his progress at the next follow-up visit   Orders Placed:  Orders Placed This Encounter  Procedures   Hemoglobin and hematocrit, blood    [name] Status:   Future    Number of Occurrences:   1    Expected Date:   03/18/2024    Expiration  Date:   03/18/2025   Comprehensive metabolic panel with GFR    [name] Status:    Future    Number of Occurrences:   1    Expected Date:   03/18/2024    Expiration Date:   03/18/2025   Lipid panel    [name] Status:   Future    Number of Occurrences:   1    Expected Date:   03/18/2024    Expiration Date:   03/18/2025   EKG 12-Lead   ECHOCARDIOGRAM COMPLETE    [name] Status:   Future    Expected Date:   03/18/2025    Where should this test be performed[facility]    Does the patient weigh less than or greater than 250 lbs?:   Patient weighs less than 250 lbs    Perflutren DEFINITY (image enhancing agent) should be administered unless hypersensitivity or allergy exist:   Administer Perflutren    Reason for exam-Echo:   CAD Native Vessel  I25.10     Final Medication List:    Meds ordered this encounter  Medications   rivaroxaban  (XARELTO ) 20 MG TABS tablet    Sig: Take 1 tablet (20 mg total) by mouth daily with supper.    Dispense:  90 tablet    Refill:  3   amLODipine  (NORVASC ) 5 MG tablet    Sig: Take 1 tablet (5 mg total) by mouth daily.    Dispense:  30 tablet    Refill:  3    Medications Discontinued During This Encounter  Medication Reason   rivaroxaban  (XARELTO ) 20 MG TABS tablet Reorder     Current Outpatient Medications:    acetaminophen  (TYLENOL ) 500 MG tablet, Take 500-1,000 mg by mouth every 6 (six) hours as needed (for pain.)., Disp: , Rfl:    alprazolam  (XANAX ) 2 MG tablet, Take 2 mg by mouth at bedtime., Disp: , Rfl:    amLODipine  (NORVASC ) 5 MG tablet, Take 1 tablet (5 mg total) by mouth daily., Disp: 30 tablet, Rfl: 3   atorvastatin  (LIPITOR) 40 MG tablet, Take 1 tablet (40 mg total) by mouth daily., Disp: 90 tablet, Rfl: 3   diphenhydrAMINE (BENADRYL) 25 MG tablet, Take 25 mg by mouth every 6 (six) hours as needed for allergies., Disp: , Rfl:    dronedarone  (MULTAQ ) 400 MG tablet, Take 1 tablet (400 mg total) by mouth 2 (two) times daily with a meal., Disp: 180 tablet, Rfl: 3   metoprolol  succinate (TOPROL -XL) 50 MG 24 hr  tablet, TAKE 1 TABLET EVERY DAY  WITH  OR  IMMEDIATELY  FOLLOWING A MEAL, Disp: 90 tablet, Rfl: 0   nitroGLYCERIN  (NITROSTAT ) 0.4 MG SL tablet, Place under the tongue., Disp: , Rfl:    olmesartan (BENICAR) 40 MG tablet, Take 1 tablet by mouth daily., Disp: , Rfl:    rivaroxaban  (XARELTO ) 20 MG TABS tablet, Take 1 tablet (20 mg total) by mouth daily with supper., Disp: 90 tablet, Rfl: 3  Consent:   NA  Disposition:   1 year follow-up sooner if needed  His questions and concerns were addressed to his satisfaction. He voices understanding of the recommendations provided during this encounter.    Signed, Madonna Michele HAS, [facility]  A Division of [facility] [address] [place], [place] [address]  [place], [place] [address] 03/18/2024 9:45 AM

## 2024-03-19 ENCOUNTER — Telehealth: Payer: Self-pay | Admitting: Internal Medicine

## 2024-03-19 ENCOUNTER — Ambulatory Visit: Payer: Self-pay | Admitting: Cardiology

## 2024-03-19 ENCOUNTER — Emergency Department (HOSPITAL_BASED_OUTPATIENT_CLINIC_OR_DEPARTMENT_OTHER)
Admission: EM | Admit: 2024-03-19 | Discharge: 2024-03-19 | Disposition: A | Discharge status: Home / Self Care | Source: Ambulatory Visit | Attending: Emergency Medicine | Admitting: Emergency Medicine

## 2024-03-19 ENCOUNTER — Other Ambulatory Visit: Payer: Self-pay

## 2024-03-19 ENCOUNTER — Telehealth: Payer: Self-pay | Admitting: Cardiology

## 2024-03-19 ENCOUNTER — Encounter (HOSPITAL_BASED_OUTPATIENT_CLINIC_OR_DEPARTMENT_OTHER): Payer: Self-pay | Admitting: Emergency Medicine

## 2024-03-19 DIAGNOSIS — Z7901 Long term (current) use of anticoagulants: Secondary | ICD-10-CM | POA: Diagnosis not present

## 2024-03-19 DIAGNOSIS — I10 Essential (primary) hypertension: Secondary | ICD-10-CM | POA: Insufficient documentation

## 2024-03-19 DIAGNOSIS — E871 Hypo-osmolality and hyponatremia: Secondary | ICD-10-CM | POA: Diagnosis not present

## 2024-03-19 DIAGNOSIS — Z85828 Personal history of other malignant neoplasm of skin: Secondary | ICD-10-CM | POA: Diagnosis not present

## 2024-03-19 DIAGNOSIS — I2510 Atherosclerotic heart disease of native coronary artery without angina pectoris: Secondary | ICD-10-CM | POA: Diagnosis not present

## 2024-03-19 DIAGNOSIS — Z79899 Other long term (current) drug therapy: Secondary | ICD-10-CM | POA: Diagnosis not present

## 2024-03-19 DIAGNOSIS — R7989 Other specified abnormal findings of blood chemistry: Secondary | ICD-10-CM | POA: Diagnosis present

## 2024-03-19 DIAGNOSIS — E875 Hyperkalemia: Secondary | ICD-10-CM | POA: Diagnosis not present

## 2024-03-19 DIAGNOSIS — R001 Bradycardia, unspecified: Secondary | ICD-10-CM | POA: Diagnosis not present

## 2024-03-19 LAB — CBC WITH DIFFERENTIAL/PLATELET
Abs Immature Granulocytes: 0.02 K/uL (ref 0.00–0.07)
Basophils Absolute: 0.1 K/uL (ref 0.0–0.1)
Basophils Relative: 1 %
Eosinophils Absolute: 0.2 K/uL (ref 0.0–0.5)
Eosinophils Relative: 2 %
HCT: 39.1 % (ref 39.0–52.0)
Hemoglobin: 13.3 g/dL (ref 13.0–17.0)
Immature Granulocytes: 0 %
Lymphocytes Relative: 20 %
Lymphs Abs: 1.5 K/uL (ref 0.7–4.0)
MCH: 30.6 pg (ref 26.0–34.0)
MCHC: 34 g/dL (ref 30.0–36.0)
MCV: 89.9 fL (ref 80.0–100.0)
Monocytes Absolute: 0.7 K/uL (ref 0.1–1.0)
Monocytes Relative: 9 %
Neutro Abs: 5 K/uL (ref 1.7–7.7)
Neutrophils Relative %: 68 %
Platelets: 257 K/uL (ref 150–400)
RBC: 4.35 MIL/uL (ref 4.22–5.81)
RDW: 13.1 % (ref 11.5–15.5)
WBC: 7.4 K/uL (ref 4.0–10.5)
nRBC: 0 % (ref 0.0–0.2)

## 2024-03-19 LAB — BASIC METABOLIC PANEL WITH GFR
Anion gap: 9 (ref 5–15)
BUN: 14 mg/dL (ref 8–23)
CO2: 24 mmol/L (ref 22–32)
Calcium: 9.3 mg/dL (ref 8.9–10.3)
Chloride: 94 mmol/L — ABNORMAL LOW (ref 98–111)
Creatinine, Ser: 1.5 mg/dL — ABNORMAL HIGH (ref 0.61–1.24)
GFR, Estimated: 49 mL/min — ABNORMAL LOW (ref >60)
Glucose, Bld: 102 mg/dL — ABNORMAL HIGH (ref 70–99)
Potassium: 5.6 mmol/L — ABNORMAL HIGH (ref 3.5–5.1)
Sodium: 127 mmol/L — ABNORMAL LOW (ref 135–145)

## 2024-03-19 LAB — CK: Total CK: 134 U/L (ref 49–397)

## 2024-03-19 MED ORDER — SODIUM ZIRCONIUM CYCLOSILICATE 10 G PO PACK
10.0000 g | PACK | Freq: Once | ORAL | Status: AC
  Administered 2024-03-19: 10 g via ORAL
  Filled 2024-03-19: qty 1

## 2024-03-19 MED ORDER — LOKELMA 10 G PO PACK: 10.0000 g | PACK | Freq: Every day | ORAL | 0 refills | Status: AC

## 2024-03-19 NOTE — ED Provider Notes (Signed)
 Tom [name]   CSN: [id number] Arrival date & time: 03/19/24  9065     Patient presents with: Abnormal Labs   Tom [name] is a 73 y.o. male.   Patient is a 73 year old male who has a history of hyperlipidemia, coronary artery disease, hypertension, AAA, paroxysmal atrial fibrillation on Xarelto .  He reports that he was called by his PCP this morning saying that his potassium is high and he needs to come into the ED.  He denies any symptoms.  He does say he has a stent in his left kidney so does not have full renal function.  He denies any nausea or vomiting.  No fatigue.  No chest pain or shortness of breath.       Prior to Admission medications   Medication Sig Start Date End Date Taking? Authorizing Provider  acetaminophen  (TYLENOL ) 500 MG tablet Take 500-1,000 mg by mouth every 6 (six) hours as needed (for pain.).    [provider]  alprazolam  (XANAX ) 2 MG tablet Take 2 mg by mouth at bedtime.    [provider]  amLODipine  (NORVASC ) 5 MG tablet Take 1 tablet (5 mg total) by mouth daily. 03/18/24   Tolia, Sunit, DO  atorvastatin  (LIPITOR) 40 MG tablet Take 1 tablet (40 mg total) by mouth daily. 06/13/16   Nahser, Aleene PARAS, MD  diphenhydrAMINE (BENADRYL) 25 MG tablet Take 25 mg by mouth every 6 (six) hours as needed for allergies.    [provider]  dronedarone  (MULTAQ ) 400 MG tablet Take 1 tablet (400 mg total) by mouth 2 (two) times daily with a meal. 09/12/23   Nahser, Aleene PARAS, MD  metoprolol  succinate (TOPROL -XL) 50 MG 24 hr tablet TAKE 1 TABLET EVERY DAY  WITH  OR  IMMEDIATELY  FOLLOWING A MEAL 05/14/19   Nahser, Aleene PARAS, MD  nitroGLYCERIN  (NITROSTAT ) 0.4 MG SL tablet Place under the tongue. 09/26/21   [provider]  olmesartan (BENICAR) 40 MG tablet Take 1 tablet by mouth daily. 07/23/20   [provider]  rivaroxaban  (XARELTO ) 20 MG TABS tablet Take 1 tablet (20 mg total) by  mouth daily with supper. 03/18/24   Tolia, Sunit, DO    Allergies: Niacin and related and Codeine    Review of Systems  Constitutional:  Negative for fatigue and fever.  Respiratory:  Negative for shortness of breath.   Cardiovascular:  Negative for chest pain.  Gastrointestinal:  Negative for abdominal pain, diarrhea, nausea and vomiting.  Musculoskeletal:  Negative for arthralgias.  Neurological:  Negative for dizziness and headaches.    Updated Vital Signs BP 133/64   Pulse (!) 47   Temp 97.6 F (36.4 C) (Oral)   Resp 14   SpO2 97%   Physical Exam Constitutional:      Appearance: He is well-developed.  HENT:     Head: Normocephalic and atraumatic.  Eyes:     Pupils: Pupils are equal, round, and reactive to light.  Cardiovascular:     Rate and Rhythm: Normal rate and regular rhythm.     Heart sounds: Normal heart sounds.  Pulmonary:     Effort: Pulmonary effort is normal. No respiratory distress.     Breath sounds: Normal breath sounds. No wheezing or rales.  Chest:     Chest wall: No tenderness.  Abdominal:     General: Bowel sounds are normal.     Palpations: Abdomen is soft.     Tenderness: There is  no abdominal tenderness. There is no guarding or rebound.  Musculoskeletal:        General: Normal range of motion.     Cervical back: Normal range of motion and neck supple.  Lymphadenopathy:     Cervical: No cervical adenopathy.  Skin:    General: Skin is warm and dry.     Findings: No rash.  Neurological:     Mental Status: He is alert and oriented to person, place, and time.     (all labs ordered are listed, but only abnormal results are displayed) Labs Reviewed  BASIC METABOLIC PANEL WITH GFR - Abnormal; Notable for the following components:      Result Value   Sodium 127 (*)    Potassium 5.6 (*)    Chloride 94 (*)    Glucose, Bld 102 (*)    Creatinine, Ser 1.50 (*)    GFR, Estimated 49 (*)    All other components within normal limits  CBC WITH  DIFFERENTIAL/PLATELET  CK    EKG: EKG Interpretation Date/Time:  Wednesday March 19 2024 09:41:16 EDT Ventricular Rate:  53 PR Interval:  161 QRS Duration:  111 QT Interval:  461 QTC Calculation: 433 R Axis:   83  Text Interpretation: Sinus rhythm Probable left atrial enlargement Borderline right axis deviation Consider anterior infarct since last tracing no significant change Confirmed by Lenor Hollering [phone]) on 03/19/2024 10:40:30 AM  Radiology: No results found.   Procedures   Medications Ordered in the ED  sodium zirconium cyclosilicate  (LOKELMA ) packet 10 g (10 g Oral Given 03/19/24 1122)                                    Medical Decision Making Amount and/or Complexity of Data Reviewed Labs: ordered.  Risk Prescription drug management.   This patient presents to the ED for concern of high potassium level, this involves an extensive number of treatment options, and is a complaint that carries with it a high risk of complications and morbidity.  I considered the following differential and admission for this acute, potentially life threatening condition.  The differential diagnosis includes medication reaction, false reading, dehydration, kidney failure, rhabdomyolysis, adrenal insufficiency  MDM:    Patient is 73 year old who presents with an abnormal potassium level as noted on blood work that was obtained yesterday by his cardiologist.  He is asymptomatic.  His EKG does not show any abnormal findings.  He is a bit bradycardic but this seems to be baseline for him.  His labs show a slightly elevated potassium at 5.6.  He also has some hyponatremia which appears to be chronic for him.  CK is normal without suggestions of rhabdomyolysis.  He was given a dose of Lokelma .  Discussed with his primary care doctor, Dr. Sheryle.  His potassium is mildly elevated, he is asymptomatic.  Suspect he can be followed up closely as an outpatient with recheck of his lab works.  His primary  care doctor is on board with this and will arrange for close follow-up.  I also advised his patient to call the office and see when they want him to come in to get his blood work rechecked.  He was encouraged to stay well-hydrated as his creatinine is slightly more increased from his prior values.  His sodium was low but this appears to be baseline for him as well.  He was discharged home in good condition.  Return precautions were given.  (Labs, imaging, consults)  Labs: I Ordered, and personally interpreted labs.  The pertinent results include: Hyponatremia, hyperkalemia  Imaging Studies ordered: I ordered imaging studies including none   Additional history obtained from wife.  External records from outside source obtained and reviewed including history  Cardiac Monitoring: The patient was maintained on a cardiac monitor.  If on the cardiac monitor, I personally viewed and interpreted the cardiac monitored which showed an underlying rhythm of: Sinus bradycardia  Reevaluation: After the interventions noted above, I reevaluated the patient and found that they have :stayed the same  Social Determinants of Health:  married  Disposition: Discharged to home  Co morbidities that complicate the patient evaluation  Past Medical History:  Diagnosis Date   AAA (abdominal aortic aneurysm) (HCC)    Anxiety    Anxiety    Atrial fibrillation (HCC) 07/11/2013   CAD (coronary artery disease) 01/27/2011   Cancer (HCC) 2013   skin cancer,   MOHS surgery   Chronic pain of left knee 02/05/2018   Coronary artery disease    Dental bridge present    upper   Dental crowns present    Diarrhea 06/17/2010   Qualifier: Diagnosis of  By: Shaaron MD, GENI HAMMANS, R. Ozell     Family history of adverse reaction to anesthesia    pt's mother has hx. of post-op N/V   Foreign body of finger of left hand 12/2014   index finger   GERD (gastroesophageal reflux disease)    Hypercholesteremia    Hyperlipidemia     Hypertension    states under control with med., has been on med. x 20 yr.   Hyponatremia 10/10/2018   Myocardial infarction (HCC) 1997   PAF (paroxysmal atrial fibrillation) (HCC)    Syncope 10/10/2018   Tobacco abuse 01/27/2011   Wears glasses      Medicines Meds ordered this encounter  Medications   sodium zirconium cyclosilicate  (LOKELMA ) packet 10 g    I have reviewed the patients home medicines and have made adjustments as needed  Problem List / ED Course: Problem List Items Addressed This Visit   None Visit Diagnoses       Hyperkalemia    -  Primary                Final diagnoses:  Hyperkalemia    ED Discharge Orders     None          Lenor Hollering, MD 03/19/24 1138

## 2024-03-19 NOTE — Telephone Encounter (Signed)
Critical labs results. Please advise

## 2024-03-19 NOTE — Telephone Encounter (Signed)
 Spoke to patient recent lab results given.Advised he needs to go to ED for elevated potassium.Stated he cannot go at present he is in process of selling a house.Strongly advised to go to ED.Stated he will go sometime today.

## 2024-03-19 NOTE — Telephone Encounter (Signed)
 [facility] called back but was unable to get triage on the phone. Please advise

## 2024-03-19 NOTE — ED Triage Notes (Signed)
 Sent by PCP d/t to elevated K. Denies symptoms.

## 2024-03-19 NOTE — Telephone Encounter (Signed)
 Got disconnect while trying to get triage on the phone.

## 2024-03-19 NOTE — Discharge Instructions (Addendum)
 Avoid certain foods containing higher levels of potassium as discussed.  Make sure that you are staying hydrated.  Have close follow-up with your primary care doctor to have your blood work rechecked.  Return to the emergency room if you have any worsening symptoms.

## 2024-03-19 NOTE — ED Notes (Signed)
 He tells me he is pain-free and feels fine. His wife is with him.

## 2024-03-19 NOTE — ED Notes (Signed)
 Pt given discharge instructions and reviewed prescriptions. Opportunities given for questions. Pt verbalizes understanding. Jillyn Hidden, RN

## 2024-03-19 NOTE — Telephone Encounter (Signed)
 Received a call from [facility] for critical K of 5.9 (not hemolyzed). I called the patient on his 2 listed numbers [phone] and [phone]) to alert him of the result and ask him to go to the ED with no answer. Will try again later.  Gillian Cass, MD

## 2024-03-19 NOTE — Telephone Encounter (Signed)
 Called [employer] back about Critical Alert for potassium 5.9 yesterday. This has been addressed and repeat lab work was done this morning.

## 2024-03-20 ENCOUNTER — Telehealth: Payer: Self-pay | Admitting: Cardiology

## 2024-03-20 LAB — COMPREHENSIVE METABOLIC PANEL WITH GFR
ALT: 23 IU/L (ref 0–44)
AST: 25 IU/L (ref 0–40)
Albumin: 4.3 g/dL (ref 3.8–4.8)
Alkaline Phosphatase: 90 IU/L (ref 44–121)
BUN/Creatinine Ratio: 10 (ref 10–24)
BUN: 14 mg/dL (ref 8–27)
Bilirubin Total: 0.5 mg/dL (ref 0.0–1.2)
CO2: 19 mmol/L — AB (ref 20–29)
Calcium: 9 mg/dL (ref 8.6–10.2)
Chloride: 91 mmol/L — AB (ref 96–106)
Creatinine, Ser: 1.35 mg/dL — AB (ref 0.76–1.27)
Globulin, Total: 2.3 g/dL (ref 1.5–4.5)
Glucose: 66 mg/dL — AB (ref 70–99)
Potassium: 5.9 mmol/L — AB (ref 3.5–5.2)
Sodium: 125 mmol/L — AB (ref 134–144)
Total Protein: 6.6 g/dL (ref 6.0–8.5)
eGFR: 56 mL/min/1.73 — AB (ref >59)

## 2024-03-20 LAB — LIPID PANEL
Cholesterol, Total: 88 mg/dL — AB (ref 100–199)
HDL: 34 mg/dL — AB (ref >39)
LDL CALC COMMENT:: 2.6 ratio (ref 0.0–5.0)
LDL Chol Calc (NIH): 40 mg/dL (ref 0–99)
Triglycerides: 57 mg/dL (ref 0–149)
VLDL Cholesterol Cal: 14 mg/dL (ref 5–40)

## 2024-03-20 LAB — HEMOGLOBIN AND HEMATOCRIT, BLOOD
Hematocrit: 42.5 % (ref 37.5–51.0)
Hemoglobin: 13.7 g/dL (ref 13.0–17.7)

## 2024-03-20 NOTE — Telephone Encounter (Signed)
 Calling to report Critical findings. Call transferred

## 2024-03-20 NOTE — Telephone Encounter (Signed)
 [facility] hung up before call transferred to [employer] .  Pt had an elevated potassium of 5.9 on 03/18/24.  Pt advised to go to ED.   Per chart review pt went to [facility] ED : His labs show a slightly elevated potassium at 5.6. He also has some hyponatremia which appears to be chronic for him. CK is normal without suggestions of rhabdomyolysis. He was given a dose of Lokelma . Discussed with his primary care doctor, Dr. Sheryle. His potassium is mildly elevated, he is asymptomatic. Suspect he can be followed up closely as an outpatient with recheck of his lab works. His primary care doctor is on board with this and will arrange for close follow-up. I also advised his patient to call the office and see when they want him to come in to get his blood work rechecked. He was encouraged to stay well-hydrated as his creatinine is slightly more increased from his prior values. His sodium was low but this appears to be baseline for him as well. He was discharged home in good condition. Return precautions were given.

## 2024-03-20 NOTE — Telephone Encounter (Signed)
 Patience with [facility] calling to report critical lab results

## 2024-03-20 NOTE — Telephone Encounter (Signed)
 Received a call from Darice with [employer] calling to report elevated potassium 5.9 done on 03/18/24.After reviewing chart already taken care of.

## 2024-03-27 ENCOUNTER — Ambulatory Visit (HOSPITAL_BASED_OUTPATIENT_CLINIC_OR_DEPARTMENT_OTHER)
Admission: RE | Admit: 2024-03-27 | Discharge: 2024-03-27 | Disposition: A | Discharge status: Home / Self Care | Source: Ambulatory Visit | Attending: Vascular Surgery | Admitting: Vascular Surgery

## 2024-03-27 ENCOUNTER — Ambulatory Visit: Attending: Vascular Surgery | Admitting: Physician Assistant

## 2024-03-27 ENCOUNTER — Ambulatory Visit (HOSPITAL_COMMUNITY)
Admission: RE | Admit: 2024-03-27 | Discharge: 2024-03-27 | Disposition: A | Discharge status: Home / Self Care | Source: Ambulatory Visit | Attending: Vascular Surgery | Admitting: Vascular Surgery

## 2024-03-27 ENCOUNTER — Ambulatory Visit (HOSPITAL_BASED_OUTPATIENT_CLINIC_OR_DEPARTMENT_OTHER)
Admission: RE | Admit: 2024-03-27 | Discharge: 2024-03-27 | Disposition: A | Discharge status: Home / Self Care | Source: Ambulatory Visit | Attending: Vascular Surgery

## 2024-03-27 VITALS — BP 139/63 | HR 53 | Temp 97.9°F | Wt 160.1 lb

## 2024-03-27 DIAGNOSIS — I6529 Occlusion and stenosis of unspecified carotid artery: Secondary | ICD-10-CM | POA: Diagnosis not present

## 2024-03-27 DIAGNOSIS — I701 Atherosclerosis of renal artery: Secondary | ICD-10-CM | POA: Insufficient documentation

## 2024-03-27 DIAGNOSIS — I6523 Occlusion and stenosis of bilateral carotid arteries: Secondary | ICD-10-CM

## 2024-03-27 DIAGNOSIS — Z8679 Personal history of other diseases of the circulatory system: Secondary | ICD-10-CM | POA: Diagnosis not present

## 2024-03-27 DIAGNOSIS — Z9889 Other specified postprocedural states: Secondary | ICD-10-CM

## 2024-03-27 NOTE — Progress Notes (Signed)
 Office Note   History of Present Illness   Tom [name] is a 73 y.o. ([birth year]) male who presents for follow up.  We have followed the patient for a history of AAA s/p repair in 2013 by Dr. Harvey.  He also has a history of left renal artery stenting in 2013 by Dr. Harvey.  He also has a known history of carotid artery stenosis.  He returns today for follow-up.  He says that he is doing well without any new health issues.  He denies any new or worsening abdominal or back pain.  He also denies any uncontrolled hypertension or strokelike symptoms.  His recent lab work demonstrates stable serum creatinine at 1.5.  Current Outpatient Medications  Medication Sig Dispense Refill   acetaminophen  (TYLENOL ) 500 MG tablet Take 500-1,000 mg by mouth every 6 (six) hours as needed (for pain.).     alprazolam  (XANAX ) 2 MG tablet Take 2 mg by mouth at bedtime.     amLODipine  (NORVASC ) 5 MG tablet Take 1 tablet (5 mg total) by mouth daily. 30 tablet 3   atorvastatin  (LIPITOR) 40 MG tablet Take 1 tablet (40 mg total) by mouth daily. 90 tablet 3   diphenhydrAMINE (BENADRYL) 25 MG tablet Take 25 mg by mouth every 6 (six) hours as needed for allergies.     dronedarone  (MULTAQ ) 400 MG tablet Take 1 tablet (400 mg total) by mouth 2 (two) times daily with a meal. 180 tablet 3   metoprolol  succinate (TOPROL -XL) 50 MG 24 hr tablet TAKE 1 TABLET EVERY DAY  WITH  OR  IMMEDIATELY  FOLLOWING A MEAL 90 tablet 0   nitroGLYCERIN  (NITROSTAT ) 0.4 MG SL tablet Place under the tongue.     olmesartan (BENICAR) 40 MG tablet Take 1 tablet by mouth daily.     rivaroxaban  (XARELTO ) 20 MG TABS tablet Take 1 tablet (20 mg total) by mouth daily with supper. 90 tablet 3   sodium zirconium cyclosilicate  (LOKELMA ) 10 g PACK packet Take 10 g by mouth daily. 2 packet 0   No current facility-administered medications for this visit.    REVIEW OF SYSTEMS (negative unless checked):   Cardiac:  []  Chest pain or chest pressure? []   Shortness of breath upon activity? []  Shortness of breath when lying flat? []  Irregular heart rhythm?  Vascular:  []  Pain in calf, thigh, or hip brought on by walking? []  Pain in feet at night that wakes you up from your sleep? []  Blood clot in your veins? []  Leg swelling?  Pulmonary:  []  Oxygen at home? []  Productive cough? []  Wheezing?  Neurologic:  []  Sudden weakness in arms or legs? []  Sudden numbness in arms or legs? []  Sudden onset of difficult speaking or slurred speech? []  Temporary loss of vision in one eye? []  Problems with dizziness?  Gastrointestinal:  []  Blood in stool? []  Vomited blood?  Genitourinary:  []  Burning when urinating? []  Blood in urine?  Psychiatric:  []  Major depression  Hematologic:  []  Bleeding problems? []  Problems with blood clotting?  Dermatologic:  []  Rashes or ulcers?  Constitutional:  []  Fever or chills?  Ear/Nose/Throat:  []  Change in hearing? []  Nose bleeds? []  Sore throat?  Musculoskeletal:  []  Back pain? []  Joint pain? []  Muscle pain?   Physical Examination   Vitals:   03/27/24 0901 03/27/24 0906  BP: 129/68 139/63  Pulse: (!) 53   Temp: 97.9 F (36.6 C)   TempSrc: Temporal   Weight: 160 lb 1.6 oz (  72.6 kg)    Body mass index is 22.97 kg/m.  General:  WDWN in NAD; vital signs documented above Gait: Not observed HENT: WNL, normocephalic Pulmonary: normal non-labored breathing , without rales, rhonchi,  wheezing Cardiac: Regular Abdomen: soft, NT, no masses Skin: without rashes Vascular Exam/Pulses: Palpable DP pulses bilaterally Extremities: without ischemic changes, without gangrene , without cellulitis; without open wounds;  Musculoskeletal: no muscle wasting or atrophy  Neurologic: A&O X 3;  No focal weakness or paresthesias are detected Psychiatric:  The pt has Normal affect.  Non-Invasive Vascular Imaging   EVAR Duplex (03/27/2024) Current size: 4.1 cm Previous size: 3.7 cm (02/09/2023) R  CIA: 1.7 cm L CIA: 1.7 cm  Renal Artery Duplex (03/27/2024) Patent left renal artery stent without stenosis  Carotid duplex (03/27/2024) Unchanged 1 to 39% bilateral carotid artery stenosis   Medical Decision Making   Tom [name] is a 73 y.o. (1951-06-08) male who presents for follow-up  Based on this patient's EVAR duplex, his AAA appears slightly larger in diameter than his last office visit.  Previous measurement was 3.7 cm and today's maximum measurement was 4.1 cm.  Ultrasound however does not demonstrate any evidence of endoleak His renal artery duplex is stable with a patent left renal artery stent and no significant renal stenosis His carotid duplex is unchanged with 1 to 39% bilateral ICA stenosis Overall he is doing well without any complaints.  He denies any new or worsening abdominal or back pain.  He also denies any strokelike symptoms such as slurred speech, facial droop, sudden weakness/numbness, or sudden visual changes.  His kidney function has been stable and he denies any uncontrolled hypertension On exam he has palpable radial and DP pulses bilaterally.  He has no abdominal tenderness. He can follow-up with our office in 1 year with repeat EVAR duplex, renal artery duplex, and carotid duplex   Ahmed Holster PA-C [facility] Office: [phone]  Clinic MD: [place]

## 2024-03-28 DIAGNOSIS — Z125 Encounter for screening for malignant neoplasm of prostate: Secondary | ICD-10-CM | POA: Diagnosis not present

## 2024-03-28 DIAGNOSIS — I10 Essential (primary) hypertension: Secondary | ICD-10-CM | POA: Diagnosis not present

## 2024-05-21 ENCOUNTER — Telehealth: Payer: Self-pay

## 2024-05-21 NOTE — Telephone Encounter (Signed)
 Prescription refill request for Xarelto  received.  Indication:afib Last office visit:7/25 Weight:72.6  kg Age:73 Scr:1.50  7/25 CrCl:45.71  ml/min  Under review

## 2024-05-22 ENCOUNTER — Other Ambulatory Visit: Payer: Self-pay

## 2024-05-22 DIAGNOSIS — I4819 Other persistent atrial fibrillation: Secondary | ICD-10-CM

## 2024-05-22 DIAGNOSIS — Z79899 Other long term (current) drug therapy: Secondary | ICD-10-CM

## 2024-05-22 DIAGNOSIS — Z7901 Long term (current) use of anticoagulants: Secondary | ICD-10-CM

## 2024-05-22 MED ORDER — RIVAROXABAN 15 MG PO TABS
15.0000 mg | ORAL_TABLET | Freq: Every day | ORAL | 1 refills | Status: AC
Start: 2024-05-22 — End: ?

## 2024-05-22 NOTE — Telephone Encounter (Signed)
 Yes, please reduce to 15mg  once daily

## 2024-06-02 ENCOUNTER — Other Ambulatory Visit: Payer: Self-pay | Admitting: Cardiology

## 2024-06-02 DIAGNOSIS — I4891 Unspecified atrial fibrillation: Secondary | ICD-10-CM

## 2024-06-02 NOTE — Telephone Encounter (Signed)
 Xarelto  15mg  sent on 05/22/24, xarelto  20mg  is the incorrect doseage. Please see 10/1 & 10/2 telephone note. Will deny refill.
